# Patient Record
Sex: Male | Born: 1950 | Race: White | Hispanic: No | Marital: Married | Smoking: Former smoker
Health system: Southern US, Community
[De-identification: ages and names within clinical notes are randomized; demographics above are authoritative.]

## PROBLEM LIST (undated history)

## (undated) DIAGNOSIS — I739 Peripheral vascular disease, unspecified: Secondary | ICD-10-CM

## (undated) DIAGNOSIS — N529 Male erectile dysfunction, unspecified: Secondary | ICD-10-CM

## (undated) DIAGNOSIS — R972 Elevated prostate specific antigen [PSA]: Secondary | ICD-10-CM

## (undated) DIAGNOSIS — E785 Hyperlipidemia, unspecified: Secondary | ICD-10-CM

## (undated) HISTORY — DX: Peripheral vascular disease, unspecified: I73.9

## (undated) HISTORY — DX: Hyperlipidemia, unspecified: E78.5

## (undated) HISTORY — PX: TONSILLECTOMY AND ADENOIDECTOMY: SHX28

## (undated) HISTORY — DX: Male erectile dysfunction, unspecified: N52.9

## (undated) HISTORY — DX: Elevated prostate specific antigen (PSA): R97.20

## (undated) HISTORY — PX: COLONOSCOPY: SHX174

## (undated) HISTORY — PX: TONSILLECTOMY: SUR1361

---

## 2005-01-04 ENCOUNTER — Ambulatory Visit (HOSPITAL_COMMUNITY): Admission: RE | Admit: 2005-01-04 | Discharge: 2005-01-04 | Payer: Self-pay | Admitting: Gastroenterology

## 2012-12-24 ENCOUNTER — Other Ambulatory Visit: Payer: Self-pay | Admitting: Dermatology

## 2013-06-15 ENCOUNTER — Other Ambulatory Visit: Payer: Self-pay | Admitting: Dermatology

## 2014-02-25 ENCOUNTER — Other Ambulatory Visit: Payer: Self-pay | Admitting: Oral and Maxillofacial Surgery

## 2015-01-30 ENCOUNTER — Ambulatory Visit: Payer: Self-pay | Admitting: Podiatry

## 2015-07-27 ENCOUNTER — Other Ambulatory Visit: Payer: Self-pay | Admitting: Internal Medicine

## 2015-07-27 ENCOUNTER — Ambulatory Visit
Admission: RE | Admit: 2015-07-27 | Discharge: 2015-07-27 | Disposition: A | Discharge status: Home / Self Care | Payer: PPO | Source: Ambulatory Visit | Attending: Internal Medicine | Admitting: Internal Medicine

## 2015-07-27 DIAGNOSIS — R52 Pain, unspecified: Secondary | ICD-10-CM

## 2015-07-27 DIAGNOSIS — M7989 Other specified soft tissue disorders: Secondary | ICD-10-CM | POA: Diagnosis not present

## 2015-07-27 DIAGNOSIS — M67472 Ganglion, left ankle and foot: Secondary | ICD-10-CM | POA: Diagnosis not present

## 2015-08-04 ENCOUNTER — Other Ambulatory Visit: Payer: Self-pay | Admitting: Internal Medicine

## 2015-08-04 DIAGNOSIS — IMO0002 Reserved for concepts with insufficient information to code with codable children: Secondary | ICD-10-CM

## 2015-08-04 DIAGNOSIS — R229 Localized swelling, mass and lump, unspecified: Principal | ICD-10-CM

## 2015-08-14 ENCOUNTER — Ambulatory Visit
Admission: RE | Admit: 2015-08-14 | Discharge: 2015-08-14 | Disposition: A | Discharge status: Home / Self Care | Payer: PPO | Source: Ambulatory Visit | Attending: Internal Medicine | Admitting: Internal Medicine

## 2015-08-14 DIAGNOSIS — IMO0002 Reserved for concepts with insufficient information to code with codable children: Secondary | ICD-10-CM

## 2015-08-14 DIAGNOSIS — R229 Localized swelling, mass and lump, unspecified: Principal | ICD-10-CM

## 2015-08-14 DIAGNOSIS — M7989 Other specified soft tissue disorders: Secondary | ICD-10-CM | POA: Diagnosis not present

## 2015-09-28 ENCOUNTER — Ambulatory Visit (INDEPENDENT_AMBULATORY_CARE_PROVIDER_SITE_OTHER): Payer: PPO | Admitting: Sports Medicine

## 2015-09-28 ENCOUNTER — Encounter: Payer: Self-pay | Admitting: Sports Medicine

## 2015-09-28 VITALS — BP 144/97 | Ht 72.0 in | Wt 181.0 lb

## 2015-09-28 DIAGNOSIS — M7751 Other enthesopathy of right foot: Secondary | ICD-10-CM | POA: Insufficient documentation

## 2015-09-28 DIAGNOSIS — M21621 Bunionette of right foot: Secondary | ICD-10-CM | POA: Diagnosis not present

## 2015-09-28 DIAGNOSIS — M71571 Other bursitis, not elsewhere classified, right ankle and foot: Secondary | ICD-10-CM

## 2015-09-28 NOTE — Assessment & Plan Note (Signed)
Green inserts and 5th ray post with MT pad on R to help prop up transverse arch and offload lateral 5th MT region F/U PRN, can consider custom orthotic with above corrections as well.

## 2015-09-28 NOTE — Progress Notes (Signed)
  [name] - 65 y.o. male MRN [id number]  Date of birth: 1951-05-19 [name] is a 65 y.o. male who presents today for R foot pain.   R lateral forefoot pain, initial visit 09/28/15 - ongoing now for several months, since August 2016, when he was at a showroom during work and is on his feet for a prolonged period of time. Pain is located along the lateral aspect of the forefoot distally at the fifth MTP lateral joint. Had tried switching shoes and had imaging done as well as aspiration done by his PCP. Did have an MRI performed on 08/14/15 which showed metatarsal bursitis of the fifth lateral region. Denies any adjustments otherwise.  PMHx - Updated and reviewed.  Contributory factors include: Negative PSHx - Updated and reviewed.  Contributory factors include:  Negative FHx - Updated and reviewed.  Contributory factors include:  Negative  Social Hx - Updated and reviewed. Contributory factors include: Non smoker  Medications - None    ROS Per HPI.  12 point negative other than per HPI.   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  Collapse of transverse arch and medial longitudinal arch.   Imaging:  MRI R foot 08/14/15- Bursitis of the fifth lateral metatarsal. Bunionette as well but no bone marrow edema.

## 2016-01-15 DIAGNOSIS — L821 Other seborrheic keratosis: Secondary | ICD-10-CM | POA: Diagnosis not present

## 2016-01-15 DIAGNOSIS — Z85828 Personal history of other malignant neoplasm of skin: Secondary | ICD-10-CM | POA: Diagnosis not present

## 2016-01-15 DIAGNOSIS — L578 Other skin changes due to chronic exposure to nonionizing radiation: Secondary | ICD-10-CM | POA: Diagnosis not present

## 2016-01-15 DIAGNOSIS — L570 Actinic keratosis: Secondary | ICD-10-CM | POA: Diagnosis not present

## 2016-01-30 DIAGNOSIS — Z125 Encounter for screening for malignant neoplasm of prostate: Secondary | ICD-10-CM | POA: Diagnosis not present

## 2016-01-30 DIAGNOSIS — E785 Hyperlipidemia, unspecified: Secondary | ICD-10-CM | POA: Diagnosis not present

## 2016-01-30 DIAGNOSIS — E7800 Pure hypercholesterolemia, unspecified: Secondary | ICD-10-CM | POA: Diagnosis not present

## 2016-01-30 DIAGNOSIS — Z0000 Encounter for general adult medical examination without abnormal findings: Secondary | ICD-10-CM | POA: Diagnosis not present

## 2016-01-30 DIAGNOSIS — I10 Essential (primary) hypertension: Secondary | ICD-10-CM | POA: Diagnosis not present

## 2016-01-30 DIAGNOSIS — D559 Anemia due to enzyme disorder, unspecified: Secondary | ICD-10-CM | POA: Diagnosis not present

## 2016-01-30 DIAGNOSIS — I491 Atrial premature depolarization: Secondary | ICD-10-CM | POA: Diagnosis not present

## 2016-02-12 DIAGNOSIS — R972 Elevated prostate specific antigen [PSA]: Secondary | ICD-10-CM | POA: Diagnosis not present

## 2016-04-05 DIAGNOSIS — R972 Elevated prostate specific antigen [PSA]: Secondary | ICD-10-CM | POA: Diagnosis not present

## 2016-04-05 DIAGNOSIS — R3915 Urgency of urination: Secondary | ICD-10-CM | POA: Diagnosis not present

## 2016-04-05 DIAGNOSIS — R350 Frequency of micturition: Secondary | ICD-10-CM | POA: Diagnosis not present

## 2016-04-05 DIAGNOSIS — N401 Enlarged prostate with lower urinary tract symptoms: Secondary | ICD-10-CM | POA: Diagnosis not present

## 2016-05-10 DIAGNOSIS — R972 Elevated prostate specific antigen [PSA]: Secondary | ICD-10-CM | POA: Diagnosis not present

## 2016-11-29 DIAGNOSIS — D225 Melanocytic nevi of trunk: Secondary | ICD-10-CM | POA: Diagnosis not present

## 2016-11-29 DIAGNOSIS — L570 Actinic keratosis: Secondary | ICD-10-CM | POA: Diagnosis not present

## 2016-11-29 DIAGNOSIS — Z85828 Personal history of other malignant neoplasm of skin: Secondary | ICD-10-CM | POA: Diagnosis not present

## 2016-11-29 DIAGNOSIS — D1801 Hemangioma of skin and subcutaneous tissue: Secondary | ICD-10-CM | POA: Diagnosis not present

## 2016-11-29 DIAGNOSIS — C44519 Basal cell carcinoma of skin of other part of trunk: Secondary | ICD-10-CM | POA: Diagnosis not present

## 2016-11-29 DIAGNOSIS — L821 Other seborrheic keratosis: Secondary | ICD-10-CM | POA: Diagnosis not present

## 2016-11-29 DIAGNOSIS — L812 Freckles: Secondary | ICD-10-CM | POA: Diagnosis not present

## 2016-11-29 DIAGNOSIS — D485 Neoplasm of uncertain behavior of skin: Secondary | ICD-10-CM | POA: Diagnosis not present

## 2016-12-10 DIAGNOSIS — R3915 Urgency of urination: Secondary | ICD-10-CM | POA: Diagnosis not present

## 2016-12-10 DIAGNOSIS — R972 Elevated prostate specific antigen [PSA]: Secondary | ICD-10-CM | POA: Diagnosis not present

## 2016-12-10 DIAGNOSIS — N401 Enlarged prostate with lower urinary tract symptoms: Secondary | ICD-10-CM | POA: Diagnosis not present

## 2016-12-10 DIAGNOSIS — N5201 Erectile dysfunction due to arterial insufficiency: Secondary | ICD-10-CM | POA: Diagnosis not present

## 2017-01-27 DIAGNOSIS — H35361 Drusen (degenerative) of macula, right eye: Secondary | ICD-10-CM | POA: Diagnosis not present

## 2017-01-27 DIAGNOSIS — H5203 Hypermetropia, bilateral: Secondary | ICD-10-CM | POA: Diagnosis not present

## 2017-01-29 DIAGNOSIS — Z0000 Encounter for general adult medical examination without abnormal findings: Secondary | ICD-10-CM | POA: Diagnosis not present

## 2017-01-29 DIAGNOSIS — E7800 Pure hypercholesterolemia, unspecified: Secondary | ICD-10-CM | POA: Diagnosis not present

## 2017-01-29 DIAGNOSIS — C4490 Unspecified malignant neoplasm of skin, unspecified: Secondary | ICD-10-CM | POA: Diagnosis not present

## 2017-01-29 DIAGNOSIS — Z6841 Body Mass Index (BMI) 40.0 and over, adult: Secondary | ICD-10-CM | POA: Diagnosis not present

## 2017-01-29 DIAGNOSIS — I491 Atrial premature depolarization: Secondary | ICD-10-CM | POA: Diagnosis not present

## 2017-03-31 DIAGNOSIS — R799 Abnormal finding of blood chemistry, unspecified: Secondary | ICD-10-CM | POA: Diagnosis not present

## 2017-03-31 DIAGNOSIS — Z23 Encounter for immunization: Secondary | ICD-10-CM | POA: Diagnosis not present

## 2017-06-06 DIAGNOSIS — Z85828 Personal history of other malignant neoplasm of skin: Secondary | ICD-10-CM | POA: Diagnosis not present

## 2017-06-06 DIAGNOSIS — C44619 Basal cell carcinoma of skin of left upper limb, including shoulder: Secondary | ICD-10-CM | POA: Diagnosis not present

## 2017-06-06 DIAGNOSIS — L821 Other seborrheic keratosis: Secondary | ICD-10-CM | POA: Diagnosis not present

## 2017-06-06 DIAGNOSIS — D485 Neoplasm of uncertain behavior of skin: Secondary | ICD-10-CM | POA: Diagnosis not present

## 2017-06-06 DIAGNOSIS — D1801 Hemangioma of skin and subcutaneous tissue: Secondary | ICD-10-CM | POA: Diagnosis not present

## 2017-06-06 DIAGNOSIS — L570 Actinic keratosis: Secondary | ICD-10-CM | POA: Diagnosis not present

## 2017-11-25 DIAGNOSIS — S30860A Insect bite (nonvenomous) of lower back and pelvis, initial encounter: Secondary | ICD-10-CM | POA: Diagnosis not present

## 2017-12-02 DIAGNOSIS — L570 Actinic keratosis: Secondary | ICD-10-CM | POA: Diagnosis not present

## 2017-12-02 DIAGNOSIS — C44519 Basal cell carcinoma of skin of other part of trunk: Secondary | ICD-10-CM | POA: Diagnosis not present

## 2017-12-02 DIAGNOSIS — Z85828 Personal history of other malignant neoplasm of skin: Secondary | ICD-10-CM | POA: Diagnosis not present

## 2017-12-02 DIAGNOSIS — D485 Neoplasm of uncertain behavior of skin: Secondary | ICD-10-CM | POA: Diagnosis not present

## 2017-12-02 DIAGNOSIS — L821 Other seborrheic keratosis: Secondary | ICD-10-CM | POA: Diagnosis not present

## 2017-12-29 DIAGNOSIS — R972 Elevated prostate specific antigen [PSA]: Secondary | ICD-10-CM | POA: Diagnosis not present

## 2018-01-02 DIAGNOSIS — R350 Frequency of micturition: Secondary | ICD-10-CM | POA: Diagnosis not present

## 2018-01-02 DIAGNOSIS — N401 Enlarged prostate with lower urinary tract symptoms: Secondary | ICD-10-CM | POA: Diagnosis not present

## 2018-01-02 DIAGNOSIS — R972 Elevated prostate specific antigen [PSA]: Secondary | ICD-10-CM | POA: Diagnosis not present

## 2018-01-23 DIAGNOSIS — E7800 Pure hypercholesterolemia, unspecified: Secondary | ICD-10-CM | POA: Diagnosis not present

## 2018-01-30 DIAGNOSIS — K635 Polyp of colon: Secondary | ICD-10-CM | POA: Diagnosis not present

## 2018-01-30 DIAGNOSIS — E785 Hyperlipidemia, unspecified: Secondary | ICD-10-CM | POA: Diagnosis not present

## 2018-01-30 DIAGNOSIS — R972 Elevated prostate specific antigen [PSA]: Secondary | ICD-10-CM | POA: Diagnosis not present

## 2018-01-30 DIAGNOSIS — Z6841 Body Mass Index (BMI) 40.0 and over, adult: Secondary | ICD-10-CM | POA: Diagnosis not present

## 2018-01-30 DIAGNOSIS — N401 Enlarged prostate with lower urinary tract symptoms: Secondary | ICD-10-CM | POA: Diagnosis not present

## 2018-02-17 DIAGNOSIS — R0781 Pleurodynia: Secondary | ICD-10-CM | POA: Diagnosis not present

## 2018-02-18 ENCOUNTER — Ambulatory Visit
Admission: RE | Admit: 2018-02-18 | Discharge: 2018-02-18 | Disposition: A | Discharge status: Home / Self Care | Payer: PPO | Source: Ambulatory Visit | Attending: Internal Medicine | Admitting: Internal Medicine

## 2018-02-18 ENCOUNTER — Other Ambulatory Visit: Payer: Self-pay | Admitting: Internal Medicine

## 2018-02-18 DIAGNOSIS — R0781 Pleurodynia: Secondary | ICD-10-CM

## 2018-02-18 DIAGNOSIS — S2241XA Multiple fractures of ribs, right side, initial encounter for closed fracture: Secondary | ICD-10-CM | POA: Diagnosis not present

## 2018-06-08 DIAGNOSIS — L812 Freckles: Secondary | ICD-10-CM | POA: Diagnosis not present

## 2018-06-08 DIAGNOSIS — Z85828 Personal history of other malignant neoplasm of skin: Secondary | ICD-10-CM | POA: Diagnosis not present

## 2018-06-08 DIAGNOSIS — L821 Other seborrheic keratosis: Secondary | ICD-10-CM | POA: Diagnosis not present

## 2018-06-08 DIAGNOSIS — L570 Actinic keratosis: Secondary | ICD-10-CM | POA: Diagnosis not present

## 2018-07-06 DIAGNOSIS — R972 Elevated prostate specific antigen [PSA]: Secondary | ICD-10-CM | POA: Diagnosis not present

## 2018-12-09 DIAGNOSIS — D2261 Melanocytic nevi of right upper limb, including shoulder: Secondary | ICD-10-CM | POA: Diagnosis not present

## 2018-12-09 DIAGNOSIS — Z85828 Personal history of other malignant neoplasm of skin: Secondary | ICD-10-CM | POA: Diagnosis not present

## 2018-12-09 DIAGNOSIS — D225 Melanocytic nevi of trunk: Secondary | ICD-10-CM | POA: Diagnosis not present

## 2018-12-09 DIAGNOSIS — C44519 Basal cell carcinoma of skin of other part of trunk: Secondary | ICD-10-CM | POA: Diagnosis not present

## 2018-12-09 DIAGNOSIS — L814 Other melanin hyperpigmentation: Secondary | ICD-10-CM | POA: Diagnosis not present

## 2018-12-09 DIAGNOSIS — D485 Neoplasm of uncertain behavior of skin: Secondary | ICD-10-CM | POA: Diagnosis not present

## 2018-12-09 DIAGNOSIS — L821 Other seborrheic keratosis: Secondary | ICD-10-CM | POA: Diagnosis not present

## 2018-12-09 DIAGNOSIS — L570 Actinic keratosis: Secondary | ICD-10-CM | POA: Diagnosis not present

## 2018-12-28 DIAGNOSIS — R972 Elevated prostate specific antigen [PSA]: Secondary | ICD-10-CM | POA: Diagnosis not present

## 2019-01-04 DIAGNOSIS — R972 Elevated prostate specific antigen [PSA]: Secondary | ICD-10-CM | POA: Diagnosis not present

## 2019-01-04 DIAGNOSIS — N400 Enlarged prostate without lower urinary tract symptoms: Secondary | ICD-10-CM | POA: Diagnosis not present

## 2019-02-01 DIAGNOSIS — C4490 Unspecified malignant neoplasm of skin, unspecified: Secondary | ICD-10-CM | POA: Diagnosis not present

## 2019-02-01 DIAGNOSIS — K635 Polyp of colon: Secondary | ICD-10-CM | POA: Diagnosis not present

## 2019-02-01 DIAGNOSIS — E7800 Pure hypercholesterolemia, unspecified: Secondary | ICD-10-CM | POA: Diagnosis not present

## 2019-02-01 DIAGNOSIS — R972 Elevated prostate specific antigen [PSA]: Secondary | ICD-10-CM | POA: Diagnosis not present

## 2019-04-30 ENCOUNTER — Other Ambulatory Visit: Payer: Self-pay

## 2019-04-30 DIAGNOSIS — Z20822 Contact with and (suspected) exposure to covid-19: Secondary | ICD-10-CM

## 2019-05-01 LAB — NOVEL CORONAVIRUS, NAA: SARS-CoV-2, NAA: NOT DETECTED

## 2019-05-03 ENCOUNTER — Other Ambulatory Visit: Payer: Self-pay | Admitting: Registered"

## 2019-05-03 DIAGNOSIS — Z20822 Contact with and (suspected) exposure to covid-19: Secondary | ICD-10-CM

## 2019-05-04 LAB — NOVEL CORONAVIRUS, NAA: SARS-CoV-2, NAA: NOT DETECTED

## 2019-06-08 ENCOUNTER — Other Ambulatory Visit (HOSPITAL_COMMUNITY): Payer: Self-pay | Admitting: Internal Medicine

## 2019-06-08 DIAGNOSIS — I999 Unspecified disorder of circulatory system: Secondary | ICD-10-CM | POA: Diagnosis not present

## 2019-06-08 DIAGNOSIS — I739 Peripheral vascular disease, unspecified: Secondary | ICD-10-CM

## 2019-06-10 ENCOUNTER — Other Ambulatory Visit: Payer: Self-pay

## 2019-06-10 ENCOUNTER — Ambulatory Visit (HOSPITAL_BASED_OUTPATIENT_CLINIC_OR_DEPARTMENT_OTHER)
Admission: RE | Admit: 2019-06-10 | Discharge: 2019-06-10 | Disposition: A | Discharge status: Home / Self Care | Payer: PPO | Source: Ambulatory Visit | Attending: Internal Medicine | Admitting: Internal Medicine

## 2019-06-10 ENCOUNTER — Ambulatory Visit (HOSPITAL_COMMUNITY)
Admission: RE | Admit: 2019-06-10 | Discharge: 2019-06-10 | Disposition: A | Discharge status: Home / Self Care | Payer: PPO | Source: Ambulatory Visit | Attending: Internal Medicine | Admitting: Internal Medicine

## 2019-06-10 DIAGNOSIS — I739 Peripheral vascular disease, unspecified: Secondary | ICD-10-CM | POA: Diagnosis not present

## 2019-06-10 DIAGNOSIS — I70211 Atherosclerosis of native arteries of extremities with intermittent claudication, right leg: Secondary | ICD-10-CM | POA: Diagnosis not present

## 2019-06-10 NOTE — Progress Notes (Addendum)
RLE arterial duplex and ABI       have been completed. Preliminary results can be found under CV proc through chart review.    Abnormal ABI on the right. Right duplex reveals occluded profunda femoral artery, popliteal artery, peroneal artery, and posterior tibial artery.    Called results to Dr. Nyoka Cowden. Patient instructed to go to the office now.   [name], BS, RDMS, RVT

## 2019-06-15 DIAGNOSIS — C44519 Basal cell carcinoma of skin of other part of trunk: Secondary | ICD-10-CM | POA: Diagnosis not present

## 2019-06-15 DIAGNOSIS — Z85828 Personal history of other malignant neoplasm of skin: Secondary | ICD-10-CM | POA: Diagnosis not present

## 2019-06-15 DIAGNOSIS — L821 Other seborrheic keratosis: Secondary | ICD-10-CM | POA: Diagnosis not present

## 2019-06-15 DIAGNOSIS — D485 Neoplasm of uncertain behavior of skin: Secondary | ICD-10-CM | POA: Diagnosis not present

## 2019-06-15 DIAGNOSIS — L812 Freckles: Secondary | ICD-10-CM | POA: Diagnosis not present

## 2019-06-16 ENCOUNTER — Encounter: Payer: Self-pay | Admitting: Cardiovascular Disease

## 2019-06-16 ENCOUNTER — Other Ambulatory Visit: Payer: Self-pay

## 2019-06-16 ENCOUNTER — Ambulatory Visit: Payer: PPO | Admitting: Cardiovascular Disease

## 2019-06-16 DIAGNOSIS — R072 Precordial pain: Secondary | ICD-10-CM | POA: Diagnosis not present

## 2019-06-16 DIAGNOSIS — E785 Hyperlipidemia, unspecified: Secondary | ICD-10-CM | POA: Insufficient documentation

## 2019-06-16 DIAGNOSIS — I739 Peripheral vascular disease, unspecified: Secondary | ICD-10-CM | POA: Insufficient documentation

## 2019-06-16 MED ORDER — CILOSTAZOL 50 MG PO TABS: 50.0000 mg | ORAL_TABLET | Freq: Two times a day (BID) | ORAL | 3 refills | Status: DC

## 2019-06-16 NOTE — Progress Notes (Signed)
06/16/2019 [name]   January 15, 1951  [id number]  Primary Physician Levin Erp, MD Primary Cardiologist: [name] MD [name], [place]  HPI:  [name] is a 68 y.o. 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  He was referred by Dr. Nyoka Cowden, his PCP, for evaluation and treatment of claudication.  His only risk factors are of treated hyperlipidemia.  He does not smoke.  There is no family history of heart disease.  Is never had a heart attack or stroke.  He denies chest pain or shortness of breath.  He is very active and exercises on a daily basis at a gym with a trainer.  He also uses free weights.  He developed right calf claudication approxi-1 month ago which has been lifestyle limiting.  Recent Dopplers performed 06/10/2019 revealed a right ABI of 1.58 with an occluded right popliteal artery.   Current Meds  Medication Sig  . atorvastatin (LIPITOR) 10 MG tablet Take 10 mg by mouth daily.     Allergies  Allergen Reactions  . Penicillins     Social History   Socioeconomic History  . Marital status: Married    Spouse name: [name]  . Number of children: [name]  . Years of education: [name]  . Highest education level: [name]  Occupational History  . [name]  Social Needs  . Financial resource strain: [name]  . Food insecurity    Worry: [name]    Inability: [name]  . Transportation needs    Medical: [name]    Non-medical: [name]  Tobacco Use  . Smoking status: Never Smoker  . Smokeless tobacco: Never Used  Substance and Sexual Activity  . Alcohol use: [name]  . Drug use: [name]  . Sexual activity: [name]  Lifestyle  . Physical activity    Days per week: [name]    Minutes per session: [name]  . Stress: [name]  Relationships  . Social Herbalist on phone: [name]   Gets together: [name]    Attends religious service: [name]    Active member of club or organization: [name]    Attends meetings of clubs or organizations: [name]    Relationship status: [name]  . Intimate partner violence    Fear of current or ex partner: [name]    Emotionally abused: [name]    Physically abused: [name]    Forced sexual activity: [name]  Other Topics Concern  . [name]  Social History Narrative  . [name]     Review of Systems: General: negative for chills, fever, night sweats or weight changes.  Cardiovascular: negative for chest pain, dyspnea on exertion, edema, orthopnea, palpitations, paroxysmal nocturnal dyspnea or shortness of breath Dermatological: negative for rash Respiratory: negative for cough or wheezing Urologic: negative for hematuria Abdominal: negative for nausea, vomiting, diarrhea, bright red blood per rectum, melena, or hematemesis Neurologic: negative for visual changes, syncope, or dizziness All other systems reviewed and are otherwise negative except as noted above.    Blood pressure 130/76, pulse 87, temperature (!) 95.7 F (35.4 C), height 6' (1.829 m),  weight 171 lb (77.6 kg).  General appearance: alert and no distress Neck: no adenopathy, no carotid bruit, no JVD, supple, symmetrical, trachea midline and thyroid not enlarged, symmetric, no tenderness/mass/nodules Lungs: clear to auscultation bilaterally Heart: regular rate and rhythm, S1, S2 normal, no murmur, click, rub or gallop Extremities: extremities normal, atraumatic, no cyanosis or edema Pulses: Absent right pedal pulse Skin: Skin color, texture, turgor normal. No rashes or lesions Neurologic: Alert and oriented X 3, normal strength and tone. Normal symmetric reflexes. Normal coordination and gait  EKG sinus rhythm 87 with low limb voltage.  I personally reviewed this EKG.  ASSESSMENT AND PLAN:   Hyperlipidemia History  of hyperlipidemia on low-dose statin therapy followed by his PCP  Claudication in peripheral vascular disease ([place]) New onset right calf claudication approximonth ago.  He is very active and exercises daily at [employer] as well as with a trainer.  He had Doppler studies performed in our office 06/10/2019 revealing an occluded popliteal artery as well as tibial vessels.  His his claudication is somewhat improved over the last month suggesting recruited collaterals.  I am going to get a coronary calcium score to assess cardiovascular risk, begin him on cilostazol 50 mg p.o. twice daily and suggest continued exercise.  I will see him back in 3 months.  If he continues to have lifestyle and claudication despite these measures we will discuss an interventional approach.      [name] MD FACP,FACC,FAHA, [facility] 06/16/2019 10:53 AM

## 2019-06-16 NOTE — Assessment & Plan Note (Signed)
History of hyperlipidemia on low-dose statin therapy followed by his PCP. ?

## 2019-06-16 NOTE — Patient Instructions (Signed)
Medication Instructions:  Start taking 50mg  Pletal Twice Daily.   If you need a refill on your cardiac medications before your next appointment, please call your pharmacy.   Lab work: NONE  Testing/Procedures: Coronary Calcium Score  Follow-Up: At [facility], you and your health needs are our priority.  As part of our continuing mission to provide you with exceptional heart care, we have created designated Provider Care Teams.  These Care Teams include your primary Cardiologist (physician) and Advanced Practice Providers (APPs -  Physician Assistants and Nurse Practitioners) who all work together to provide you with the care you need, when you need it. You may see Dr Gwenlyn Found or one of the following Advanced Practice Providers on your designated Care Team:    Kerin Ransom, PA-C  [place], [place]  Coletta Memos, [place]  Your physician wants you to follow-up in: 3 months  Any Other Special Instructions Will Be Listed Below (If Applicable). Coronary Calcium Scan A coronary calcium scan is an imaging test used to look for deposits of calcium and other fatty materials (plaques) in the inner lining of the blood vessels of the heart (coronary arteries). These deposits of calcium and plaques can partly clog and narrow the coronary arteries without producing any symptoms or warning signs. This puts a person at risk for a heart attack. This test can detect these deposits before symptoms develop. Tell a health care provider about:  Any allergies you have.  All medicines you are taking, including vitamins, herbs, eye drops, creams, and over-the-counter medicines.  Any problems you or family members have had with anesthetic medicines.  Any blood disorders you have.  Any surgeries you have had.  Any medical conditions you have.  Whether you are pregnant or may be pregnant. What are the risks? Generally, this is a safe procedure. However, problems may occur, including:  Harm to a  pregnant woman and her unborn baby. This test involves the use of radiation. Radiation exposure can be dangerous to a pregnant woman and her unborn baby. If you are pregnant, you generally should not have this procedure done.  Slight increase in the risk of cancer. This is because of the radiation involved in the test. What happens before the procedure? No preparation is needed for this procedure. What happens during the procedure?   You will undress and remove any jewelry around your neck or chest.  You will put on a hospital gown.  Sticky electrodes will be placed on your chest. The electrodes will be connected to an electrocardiogram (ECG) machine to record a tracing of the electrical activity of your heart.  A CT scanner will take pictures of your heart. During this time, you will be asked to lie still and hold your breath for 2-3 seconds while a picture of your heart is being taken. The procedure may vary among health care providers and hospitals. What happens after the procedure?  You can get dressed.  You can return to your normal activities.  It is up to you to get the results of your test. Ask your health care provider, or the department that is doing the test, when your results will be ready. Summary  A coronary calcium scan is an imaging test used to look for deposits of calcium and other fatty materials (plaques) in the inner lining of the blood vessels of the heart (coronary arteries).  Generally, this is a safe procedure. Tell your health care provider if you are pregnant or may be pregnant.  No preparation is needed for this procedure.  A CT scanner will take pictures of your heart.  You can return to your normal activities after the scan is done. This information is not intended to replace advice given to you by your health care provider. Make sure you discuss any questions you have with your health care provider. Document Released: 12/21/2007 Document Revised:  06/06/2017 Document Reviewed: 05/13/2016 Elsevier Patient Education  2020 [employer].

## 2019-06-16 NOTE — Assessment & Plan Note (Signed)
New onset right calf claudication approximonth ago.  He is very active and exercises daily at [employer] as well as with a trainer.  He had Doppler studies performed in our office 06/10/2019 revealing an occluded popliteal artery as well as tibial vessels.  His his claudication is somewhat improved over the last month suggesting recruited collaterals.  I am going to get a coronary calcium score to assess cardiovascular risk, begin him on cilostazol 50 mg p.o. twice daily and suggest continued exercise.  I will see him back in 3 months.  If he continues to have lifestyle and claudication despite these measures we will discuss an interventional approach.

## 2019-06-18 ENCOUNTER — Ambulatory Visit (INDEPENDENT_AMBULATORY_CARE_PROVIDER_SITE_OTHER): Payer: PPO | Admitting: Vascular Surgery

## 2019-06-18 ENCOUNTER — Encounter: Payer: Self-pay | Admitting: Vascular Surgery

## 2019-06-18 ENCOUNTER — Other Ambulatory Visit: Payer: Self-pay

## 2019-06-18 VITALS — BP 143/85 | HR 89 | Temp 97.9°F | Resp 20 | Ht 72.0 in | Wt 172.0 lb

## 2019-06-18 DIAGNOSIS — I739 Peripheral vascular disease, unspecified: Secondary | ICD-10-CM

## 2019-06-18 NOTE — Progress Notes (Signed)
Patient ID: [name], male   DOB: 1951/04/30, 68 y.o.   MRN: [id number]  Reason for Consult: New Patient (Initial Visit)   Referred by Levin Erp, MD  Subjective:     HPI:  [name] is a 68 y.o. male without significant vascular history.  Patient states that 5 weeks ago he developed right lower extremity calf cramping.  He is very active continues to workout although has not been doing much given COVID-19.  He also had discoloration of his toe this does not cause him any pain.  He is now back to his level of activity does not have any limitations of his walking.  He is taking 10 mg of Lipitor has begun taking baby aspirin and also taking Pletal.  Prior to 5 weeks ago patient had no issues  Past Medical History:  Diagnosis Date  . Hyperlipidemia    History reviewed. No pertinent family history. Past Surgical History:  Procedure Laterality Date  . TONSILLECTOMY AND ADENOIDECTOMY      Short Social History:  Social History   Tobacco Use  . Smoking status: Never Smoker  . Smokeless tobacco: Never Used  Substance Use Topics  . Alcohol use: Yes    Alcohol/week: 0.0 standard drinks    Allergies  Allergen Reactions  . Penicillins     Current Outpatient Medications  Medication Sig Dispense Refill  . atorvastatin (LIPITOR) 10 MG tablet Take 10 mg by mouth daily.    . cilostazol (PLETAL) 50 MG tablet Take 1 tablet (50 mg total) by mouth 2 (two) times daily. 180 tablet 3   No current facility-administered medications for this visit.    Review of Systems  Constitutional:  Constitutional negative. HENT: HENT negative.  Eyes: Eyes negative.  Cardiovascular: Positive for claudication.  GI: Gastrointestinal negative.  Musculoskeletal:       Painless right great toe discoloration Skin: Skin negative.  Neurological: Neurological negative. Hematologic: Hematologic/lymphatic negative.  Psychiatric: Psychiatric negative.        Objective:  Objective    Vitals:   06/18/19 1134  BP: (!) 143/85  Pulse: 89  Resp: 20  Temp: 97.9 F (36.6 C)  SpO2: 98%  Weight: 172 lb (78 kg)  Height: 6' (1.829 m)   Body mass index is 23.33 kg/m.  Physical Exam HENT:     Head: Normocephalic.     Right Ear: Tympanic membrane normal.     Nose: Nose normal.     Mouth/Throat:     Mouth: Mucous membranes are moist.  Eyes:     Pupils: Pupils are equal, round, and reactive to light.  Cardiovascular:     Rate and Rhythm: Normal rate.     Pulses:          Carotid pulses are 2+ on the right side and 2+ on the left side.      Radial pulses are 2+ on the right side and 2+ on the left side.       Femoral pulses are 2+ on the right side and 2+ on the left side.      Popliteal pulses are 0 on the right side and 2+ on the left side.       Dorsalis pedis pulses are 0 on the right side and 2+ on the left side.       Posterior tibial pulses are detected w/ Doppler on the right side and 2+ on the left side.  Pulmonary:     Effort: Pulmonary effort is  normal.  Abdominal:     General: Abdomen is flat.     Palpations: Abdomen is soft.  Musculoskeletal:        General: No swelling. Normal range of motion.  Skin:    General: Skin is warm and dry.  Neurological:     General: No focal deficit present.     Mental Status: He is alert.  Psychiatric:        Mood and Affect: Mood normal.        Behavior: Behavior normal.        Thought Content: Thought content normal.        Judgment: Judgment normal.     Data: I reviewed his right lower extremity studies which demonstrate ABI of 0.58 a profunda that appears occluded the triphasic common femoral artery and SFA proximally then occludes his popliteal arteries PT peroneal arteries are occluded there is monophasic signals in the anterior tibial artery     Assessment/Plan:     68 year old male without significant risk factors for vascular disease other than hyperlipidemia developed acute onset of right lower  extremity claudication.  Appears to have popliteal profunda and tibial disease.  Given the patient is mostly asymptomatic at this time I would not recommend intervention.  He does have purple discoloration of his toe although this is painless I recommend him to protect this toe.  I will see him back in short order with repeat ABIs.  Certainly if his symptoms worsen I would consider aortogram from a left common femoral approach.  Patient has been seen by Dr. Gwenlyn Found is okay with me if he follows in their office rather than here.  If patient chooses to be seen here I will see him back in 3 months unless he needs to be seen sooner.     Waynetta Sandy MD [facility]

## 2019-06-30 ENCOUNTER — Ambulatory Visit (INDEPENDENT_AMBULATORY_CARE_PROVIDER_SITE_OTHER)
Admission: RE | Admit: 2019-06-30 | Discharge: 2019-06-30 | Disposition: A | Discharge status: Home / Self Care | Payer: Self-pay | Source: Ambulatory Visit | Attending: Cardiovascular Disease | Admitting: Cardiovascular Disease

## 2019-06-30 ENCOUNTER — Other Ambulatory Visit: Payer: Self-pay

## 2019-06-30 DIAGNOSIS — R072 Precordial pain: Secondary | ICD-10-CM

## 2019-07-05 DIAGNOSIS — N401 Enlarged prostate with lower urinary tract symptoms: Secondary | ICD-10-CM | POA: Diagnosis not present

## 2019-07-07 ENCOUNTER — Other Ambulatory Visit: Payer: Self-pay | Admitting: *Deleted

## 2019-07-07 DIAGNOSIS — I739 Peripheral vascular disease, unspecified: Secondary | ICD-10-CM

## 2019-07-12 DIAGNOSIS — I739 Peripheral vascular disease, unspecified: Secondary | ICD-10-CM | POA: Diagnosis not present

## 2019-07-28 ENCOUNTER — Ambulatory Visit: Payer: PPO | Attending: Internal Medicine

## 2019-07-28 DIAGNOSIS — Z23 Encounter for immunization: Secondary | ICD-10-CM | POA: Insufficient documentation

## 2019-08-18 ENCOUNTER — Ambulatory Visit: Payer: PPO | Attending: Internal Medicine

## 2019-08-18 DIAGNOSIS — Z23 Encounter for immunization: Secondary | ICD-10-CM | POA: Insufficient documentation

## 2019-08-18 NOTE — Progress Notes (Signed)
   Covid-19 Vaccination Clinic  Name:  [name]    MRN: [id number] DOB: [birth year]  08/18/2019  Mr. Lafrance was observed post Covid-19 immunization for 15 minutes without incidence. He was provided with Vaccine Information Sheet and instruction to access the V-Safe system.   Mr. Weishaupt was instructed to call 911 with any severe reactions post vaccine: Marland Kitchen Difficulty breathing  . Swelling of your face and throat  . A fast heartbeat  . A bad rash all over your body  . Dizziness and weakness    Immunizations Administered    Name Date Dose VIS Date Route   Pfizer COVID-19 Vaccine 08/18/2019 10:29 AM 0.3 mL 06/18/2019 Intramuscular   Manufacturer: [place]   Lot: [id number]   [place]: [id number]

## 2019-09-03 DIAGNOSIS — I739 Peripheral vascular disease, unspecified: Secondary | ICD-10-CM | POA: Diagnosis not present

## 2019-09-03 DIAGNOSIS — E7849 Other hyperlipidemia: Secondary | ICD-10-CM | POA: Diagnosis not present

## 2019-09-03 DIAGNOSIS — Z85828 Personal history of other malignant neoplasm of skin: Secondary | ICD-10-CM | POA: Diagnosis not present

## 2019-09-03 DIAGNOSIS — N401 Enlarged prostate with lower urinary tract symptoms: Secondary | ICD-10-CM | POA: Diagnosis not present

## 2019-09-03 DIAGNOSIS — Z1331 Encounter for screening for depression: Secondary | ICD-10-CM | POA: Diagnosis not present

## 2019-09-14 ENCOUNTER — Encounter: Payer: Self-pay | Admitting: Cardiovascular Disease

## 2019-09-14 ENCOUNTER — Other Ambulatory Visit: Payer: Self-pay

## 2019-09-14 ENCOUNTER — Ambulatory Visit: Payer: PPO | Admitting: Cardiovascular Disease

## 2019-09-14 DIAGNOSIS — I739 Peripheral vascular disease, unspecified: Secondary | ICD-10-CM

## 2019-09-14 DIAGNOSIS — E782 Mixed hyperlipidemia: Secondary | ICD-10-CM

## 2019-09-14 NOTE — Assessment & Plan Note (Signed)
History of hyperlipidemia with lipid profile performed 09/03/2019 revealing total cholesterol 208, LDL of 124 and HDL of 68.  His PCP recently increase his atorvastatin from 10 to 40 mg a day and is following this.

## 2019-09-14 NOTE — Assessment & Plan Note (Signed)
History of peripheral arterial disease with right calf claudication new in onset in October/November with Doppler studies revealing a right ABI 0.58 and an occluded right popliteal artery.  Based on this I did begin him on Pletal 50 mg p.o. twice daily.  We talked about and consider a conservative approach unless he was recalcitrant.  He did see Dr. Donzetta Matters who felt similarly.  Since I saw him he has been exercising and his claudication has almost completely resolved.  I suspect he has recruited collaterals.  This point, we do not usually feel that an interventional approach is appropriate.

## 2019-09-14 NOTE — Patient Instructions (Signed)
Medication Instructions:  Your physician recommends that you continue on your current medications as directed. Please refer to the Current Medication list given to you today.  If you need a refill on your cardiac medications before your next appointment, please call your pharmacy.   Lab work: NONE  Testing/Procedures: NONE  Follow-Up: At [facility], you and your health needs are our priority.  As part of our continuing mission to provide you with exceptional heart care, we have created designated Provider Care Teams.  These Care Teams include your primary Cardiologist (physician) and Advanced Practice Providers (APPs -  Physician Assistants and Nurse Practitioners) who all work together to provide you with the care you need, when you need it. You may see Dr. Berry or one of the following Advanced Practice Providers on your designated Care Team:    Luke Kilroy, PA-C  Callie Goodrich, PA-C  Jesse Cleaver, FNP  Your physician wants you to follow-up in: 6 months with Dr. Berry. You will receive a reminder letter in the mail two months in advance. If you don't receive a letter, please call our office to schedule the follow-up appointment.      

## 2019-09-14 NOTE — Progress Notes (Signed)
09/14/2019 [name]   [birth year]  [id number]  Primary Physician [name], DO Primary Cardiologist: [name] MD Lupe Carney, [place]  HPI:  [name] is a 69 y.o.  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  He was referred by Dr. Nyoka Cowden, his PCP, for evaluation and treatment of claudication.  I last saw him in the office 06/15/2020. His only risk factors are of treated hyperlipidemia.  He does not smoke.  There is no family history of heart disease.  Is never had a heart attack or stroke.  He denies chest pain or shortness of breath.  He is very active and exercises on a daily basis at a gym with a trainer.  He also uses free weights.  He developed right calf claudication approxi-1 month ago which has been lifestyle limiting.  Recent Dopplers performed 06/10/2019 revealed a right ABI of 0.58 with an occluded right popliteal artery.  Since I saw him 3 months ago I did begin him on Pletal 50 mg p.o. twice daily.  He continues to walk and his claudication is all but resolved.  His PCP did begin him on a higher dose of atorvastatin (10 mg initially up to 40 mg).  I also performed a coronary calcium score which was 43 with mild scattered coronary calcifications.  He denies chest pain or shortness of breath.   Current Meds  Medication Sig  . atorvastatin (LIPITOR) 10 MG tablet Take 10 mg by mouth daily.  . cilostazol (PLETAL) 50 MG tablet Take 1 tablet (50 mg total) by mouth 2 (two) times daily.     Allergies  Allergen Reactions  . Penicillins     Social History   Socioeconomic History  . Marital status: Married    Spouse name: [name]  . Number of children: [name]  . Years of education: [name]  . Highest education level: [name]  Occupational History  . [name]  Tobacco Use  . Smoking status: Never Smoker  . Smokeless tobacco: Never Used    Substance and Sexual Activity  . Alcohol use: Yes    Alcohol/week: 0.0 standard drinks  . Drug use: Never  . Sexual activity: [name]  Other Topics Concern  . [name]  Social History Narrative  . [name]   Social Determinants of Health   Financial Resource Strain:   . Difficulty of Paying Living Expenses: [name]  Food Insecurity:   . Worried About Charity fundraiser in the Last Year: [name]  . Ran Out of Food in the Last Year: [name]  Transportation Needs:   . Lack of Transportation (Medical): [name]  . Lack of Transportation (Non-Medical): [name]  Physical Activity:   . Days of Exercise per Week: [name]  . Minutes of Exercise per Session: [name]  Stress:   . Feeling of Stress : [name]  Social Connections:   . Frequency of Communication with Friends and Family: [name]  . Frequency of Social Gatherings with Friends and Family: [name]  . Attends Religious Services: [name]  . Active Member of Clubs or Organizations: [name]  . Attends Archivist Meetings: [name]  . Marital Status: Not on  file  Intimate Partner Violence:   . Fear of Current or Ex-Partner: [name]  . Emotionally Abused: [name]  . Physically Abused: [name]  . Sexually Abused: [name]     Review of Systems: General: negative for chills, fever, night sweats or weight changes.  Cardiovascular: negative for chest pain, dyspnea on exertion, edema, orthopnea, palpitations, paroxysmal nocturnal dyspnea or shortness of breath Dermatological: negative for rash Respiratory: negative for cough or wheezing Urologic: negative for hematuria Abdominal: negative for nausea, vomiting, diarrhea, bright red blood per rectum, melena, or hematemesis Neurologic: negative for visual changes, syncope, or dizziness All other systems reviewed and are otherwise negative except as noted above.    Blood pressure (!) 142/78,  pulse 84, temperature (!) 97.2 F (36.2 C), resp. rate (!) 21, height 6' (1.829 m), weight 174 lb 6.4 oz (79.1 kg), SpO2 99 %.  General appearance: alert and no distress Neck: no adenopathy, no carotid bruit, no JVD, supple, symmetrical, trachea midline and thyroid not enlarged, symmetric, no tenderness/mass/nodules Lungs: clear to auscultation bilaterally Heart: regular rate and rhythm, S1, S2 normal, no murmur, click, rub or gallop Extremities: extremities normal, atraumatic, no cyanosis or edema Pulses: 2+ and symmetric Skin: Skin color, texture, turgor normal. No rashes or lesions Neurologic: Alert and oriented X 3, normal strength and tone. Normal symmetric reflexes. Normal coordination and gait  EKG not performed today  ASSESSMENT AND PLAN:   Hyperlipidemia History of hyperlipidemia with lipid profile performed 09/03/2019 revealing total cholesterol 208, LDL of 124 and HDL of 68.  His PCP recently increase his atorvastatin from 10 to 40 mg a day and is following this.  Claudication in peripheral vascular disease ([place]) History of peripheral arterial disease with right calf claudication new in onset in October/November with Doppler studies revealing a right ABI 0.58 and an occluded right popliteal artery.  Based on this I did begin him on Pletal 50 mg p.o. twice daily.  We talked about and consider a conservative approach unless he was recalcitrant.  He did see Dr. Donzetta Matters who felt similarly.  Since I saw him he has been exercising and his claudication has almost completely resolved.  I suspect he has recruited collaterals.  This point, we do not usually feel that an interventional approach is appropriate.      [name] MD FACP,FACC,FAHA, [facility] 09/14/2019 9:23 AM

## 2019-09-24 ENCOUNTER — Ambulatory Visit: Payer: PPO | Admitting: Vascular Surgery

## 2019-09-24 ENCOUNTER — Encounter (HOSPITAL_COMMUNITY): Payer: PPO

## 2019-12-08 DIAGNOSIS — Z85828 Personal history of other malignant neoplasm of skin: Secondary | ICD-10-CM | POA: Diagnosis not present

## 2019-12-08 DIAGNOSIS — C44719 Basal cell carcinoma of skin of left lower limb, including hip: Secondary | ICD-10-CM | POA: Diagnosis not present

## 2019-12-08 DIAGNOSIS — L570 Actinic keratosis: Secondary | ICD-10-CM | POA: Diagnosis not present

## 2019-12-08 DIAGNOSIS — L720 Epidermal cyst: Secondary | ICD-10-CM | POA: Diagnosis not present

## 2019-12-08 DIAGNOSIS — L821 Other seborrheic keratosis: Secondary | ICD-10-CM | POA: Diagnosis not present

## 2019-12-08 DIAGNOSIS — C44519 Basal cell carcinoma of skin of other part of trunk: Secondary | ICD-10-CM | POA: Diagnosis not present

## 2019-12-08 DIAGNOSIS — D485 Neoplasm of uncertain behavior of skin: Secondary | ICD-10-CM | POA: Diagnosis not present

## 2019-12-31 DIAGNOSIS — N401 Enlarged prostate with lower urinary tract symptoms: Secondary | ICD-10-CM | POA: Diagnosis not present

## 2020-01-07 DIAGNOSIS — N528 Other male erectile dysfunction: Secondary | ICD-10-CM | POA: Diagnosis not present

## 2020-01-07 DIAGNOSIS — R972 Elevated prostate specific antigen [PSA]: Secondary | ICD-10-CM | POA: Diagnosis not present

## 2020-01-07 DIAGNOSIS — N401 Enlarged prostate with lower urinary tract symptoms: Secondary | ICD-10-CM | POA: Diagnosis not present

## 2020-01-07 DIAGNOSIS — N400 Enlarged prostate without lower urinary tract symptoms: Secondary | ICD-10-CM | POA: Diagnosis not present

## 2020-01-20 DIAGNOSIS — Z85828 Personal history of other malignant neoplasm of skin: Secondary | ICD-10-CM | POA: Diagnosis not present

## 2020-01-20 DIAGNOSIS — C44519 Basal cell carcinoma of skin of other part of trunk: Secondary | ICD-10-CM | POA: Diagnosis not present

## 2020-01-20 DIAGNOSIS — L821 Other seborrheic keratosis: Secondary | ICD-10-CM | POA: Diagnosis not present

## 2020-02-01 DIAGNOSIS — Z125 Encounter for screening for malignant neoplasm of prostate: Secondary | ICD-10-CM | POA: Diagnosis not present

## 2020-02-01 DIAGNOSIS — E7849 Other hyperlipidemia: Secondary | ICD-10-CM | POA: Diagnosis not present

## 2020-02-03 ENCOUNTER — Other Ambulatory Visit: Payer: Self-pay | Admitting: Internal Medicine

## 2020-02-03 DIAGNOSIS — Z0000 Encounter for general adult medical examination without abnormal findings: Secondary | ICD-10-CM | POA: Diagnosis not present

## 2020-02-03 DIAGNOSIS — Z1331 Encounter for screening for depression: Secondary | ICD-10-CM | POA: Diagnosis not present

## 2020-02-03 DIAGNOSIS — K4090 Unilateral inguinal hernia, without obstruction or gangrene, not specified as recurrent: Secondary | ICD-10-CM | POA: Diagnosis not present

## 2020-02-03 DIAGNOSIS — N401 Enlarged prostate with lower urinary tract symptoms: Secondary | ICD-10-CM | POA: Diagnosis not present

## 2020-02-03 DIAGNOSIS — Z136 Encounter for screening for cardiovascular disorders: Secondary | ICD-10-CM

## 2020-02-03 DIAGNOSIS — Z87891 Personal history of nicotine dependence: Secondary | ICD-10-CM

## 2020-02-03 DIAGNOSIS — R82998 Other abnormal findings in urine: Secondary | ICD-10-CM | POA: Diagnosis not present

## 2020-02-03 DIAGNOSIS — E785 Hyperlipidemia, unspecified: Secondary | ICD-10-CM | POA: Diagnosis not present

## 2020-02-03 DIAGNOSIS — I739 Peripheral vascular disease, unspecified: Secondary | ICD-10-CM | POA: Diagnosis not present

## 2020-02-10 DIAGNOSIS — Z1212 Encounter for screening for malignant neoplasm of rectum: Secondary | ICD-10-CM | POA: Diagnosis not present

## 2020-02-28 DIAGNOSIS — H5203 Hypermetropia, bilateral: Secondary | ICD-10-CM | POA: Diagnosis not present

## 2020-02-28 DIAGNOSIS — H2513 Age-related nuclear cataract, bilateral: Secondary | ICD-10-CM | POA: Diagnosis not present

## 2020-03-03 DIAGNOSIS — Z20822 Contact with and (suspected) exposure to covid-19: Secondary | ICD-10-CM | POA: Diagnosis not present

## 2020-04-14 ENCOUNTER — Encounter: Payer: Self-pay | Admitting: Cardiovascular Disease

## 2020-04-14 ENCOUNTER — Other Ambulatory Visit: Payer: Self-pay

## 2020-04-14 ENCOUNTER — Ambulatory Visit: Payer: PPO | Admitting: Cardiovascular Disease

## 2020-04-14 VITALS — BP 140/72 | HR 81 | Ht 72.0 in | Wt 168.8 lb

## 2020-04-14 DIAGNOSIS — I739 Peripheral vascular disease, unspecified: Secondary | ICD-10-CM | POA: Diagnosis not present

## 2020-04-14 DIAGNOSIS — E782 Mixed hyperlipidemia: Secondary | ICD-10-CM | POA: Diagnosis not present

## 2020-04-14 NOTE — Progress Notes (Signed)
lipid

## 2020-04-14 NOTE — Assessment & Plan Note (Signed)
History of PAD with known occluded right popliteal artery and right ABI of 0.58.  Initially he had an excellent response to Pletal which she since discontinued although he no longer has claudication.

## 2020-04-14 NOTE — Assessment & Plan Note (Signed)
History of hyperlipidemia now on high-dose statin therapy with recent lipid profile performed 02/01/2020 while on a lower dose revealing total cholesterol 183, LDL of 112 and HDL of 56.  Given his coronary calcium score of 43 I prefer his LDL be closer to 70.  We will recheck a lipid liver profile in the next week or 2.

## 2020-04-14 NOTE — Patient Instructions (Signed)
  Lab Work:  Your physician recommends that you return for lab work FASTING  If you have labs (blood work) drawn today and your tests are completely normal, you will receive your results only by: MyChart Message (if you have MyChart) OR A paper copy in the mail If you have any lab test that is abnormal or we need to change your treatment, we will call you to review the results.   Follow-Up: At [facility], you and your health needs are our priority.  As part of our continuing mission to provide you with exceptional heart care, we have created designated Provider Care Teams.  These Care Teams include your primary Cardiologist (physician) and Advanced Practice Providers (APPs -  Physician Assistants and Nurse Practitioners) who all work together to provide you with the care you need, when you need it.  We recommend signing up for the patient portal called "MyChart".  Sign up information is provided on this After Visit Summary.  MyChart is used to connect with patients for Virtual Visits (Telemedicine).  Patients are able to view lab/test results, encounter notes, upcoming appointments, etc.  Non-urgent messages can be sent to your provider as well.   To learn more about what you can do with MyChart, go to [email or web address].    Your next appointment:   12 month(s)  The format for your next appointment:   In Person  Provider:   Jonathan Berry, MD   

## 2020-04-14 NOTE — Progress Notes (Signed)
04/14/2020 [name]   Jan 19, 1951  [id number]  Primary Physician Wyatt Margarita, DO Primary Cardiologist: Wyatt Harp MD [name], [place]  HPI:  [name] is a 69 y.o.  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 He was referred by Dr. Nyoka Jackson, his PCP, for evaluation and treatment of claudication.  I last saw him in the office 09/14/2019.His only risk factors are of treated hyperlipidemia. He does not smoke. There is no family history of heart disease. Is never had a heart attack or stroke. He denies chest pain or shortness of breath. He is very active and exercises on a daily basis at a gym with a trainer. He also uses free weights. He developed right calf claudication approxi-1 month ago which has been lifestyle limiting. Recent Dopplers performed 06/10/2019 revealed a right ABI of 0.58 with an occluded right popliteal artery.   I did begin him on Pletal 50 mg p.o. twice daily.  He continues to walk and his claudication is all but resolved.  His PCP did begin him on a higher dose of atorvastatin (10 mg initially up to 40 mg).  I also performed a coronary calcium score which was 43 with mild scattered coronary calcifications.  He denies chest pain or shortness of breath.  Since I saw him 6 months ago he has stopped his Pletal but no longer complains of claudication.  He is fairly active and works out on an elliptical as well as multiple machines and walks outside without limitation.  He denies chest pain or shortness of breath.  His PCP just increase his atorvastatin from 40 to 80 mg a day and we will recheck with a goal of less than 70 given his elevated coronary calcium score.   Current Meds  Medication Sig  . atorvastatin (LIPITOR) 10 MG tablet Take 10 mg by mouth daily.     Allergies  Allergen Reactions  . Penicillins     Social History   Socioeconomic History  .  Marital status: Married    Spouse name: [name]  . Number of children: [name]  . Years of education: [name]  . Highest education level: [name]  Occupational History  . [name]  Tobacco Use  . Smoking status: Never Smoker  . Smokeless tobacco: Never Used  Vaping Use  . Vaping Use: Never used  Substance and Sexual Activity  . Alcohol use: Yes    Alcohol/week: 0.0 standard drinks  . Drug use: Never  . Sexual activity: [name]  Other Topics Concern  . [name]  Social History Narrative  . [name]   Social Determinants of Health   Financial Resource Strain:   . Difficulty of Paying Living Expenses: [name]  Food Insecurity:   . Worried About Charity fundraiser in the Last Year: [name]  . Ran Out of Food in the Last Year: [name]  Transportation Needs:   . Lack of Transportation (Medical): [name]  . Lack of Transportation (Non-Medical): [name]  Physical Activity:   . Days of Exercise per Week: [name]  . Minutes of Exercise per Session: [name]  Stress:   . Feeling of Stress : [name]  Social Connections:   . Frequency of Communication with Friends  and Family: [name]  . Frequency of Social Gatherings with Friends and Family: [name]  . Attends Religious Services: [name]  . Active Member of Clubs or Organizations: [name]  . Attends Archivist Meetings: [name]  . Marital Status: [name]  Intimate Partner Violence:   . Fear of Current or Ex-Partner: [name]  . Emotionally Abused: [name]  . Physically Abused: [name]  . Sexually Abused: [name]     Review of Systems: General: negative for chills, fever, night sweats or weight changes.  Cardiovascular: negative for chest pain, dyspnea on exertion, edema, orthopnea, palpitations, paroxysmal nocturnal dyspnea or shortness of breath Dermatological: negative for rash Respiratory: negative for cough or  wheezing Urologic: negative for hematuria Abdominal: negative for nausea, vomiting, diarrhea, bright red blood per rectum, melena, or hematemesis Neurologic: negative for visual changes, syncope, or dizziness All other systems reviewed and are otherwise negative except as noted above.    Blood pressure 140/72, pulse 81, height 6' (1.829 m), weight 168 lb 12.8 oz (76.6 kg), SpO2 96 %.  General appearance: alert and no distress Neck: no adenopathy, no carotid bruit, no JVD, supple, symmetrical, trachea midline and thyroid not enlarged, symmetric, no tenderness/mass/nodules Lungs: clear to auscultation bilaterally Heart: regular rate and rhythm, S1, S2 normal, no murmur, click, rub or gallop Extremities: extremities normal, atraumatic, no cyanosis or edema Pulses: 2+ and symmetric Skin: Skin color, texture, turgor normal. No rashes or lesions Neurologic: Alert and oriented X 3, normal strength and tone. Normal symmetric reflexes. Normal coordination and gait  EKG sinus rhythm at 81 without ST or T wave changes.  Personally reviewed this EKG.  ASSESSMENT AND PLAN:   Hyperlipidemia History of hyperlipidemia now on high-dose statin therapy with recent lipid profile performed 02/01/2020 while on a lower dose revealing total cholesterol 183, LDL of 112 and HDL of 56.  Given his coronary calcium score of 43 I prefer his LDL be closer to 70.  We will recheck a lipid liver profile in the next week or 2.  Claudication in peripheral vascular disease ([place]) History of PAD with known occluded right popliteal artery and right ABI of 0.58.  Initially he had an excellent response to Pletal which she since discontinued although he no longer has claudication.      Wyatt Harp MD FACP,FACC,FAHA, [facility] 04/14/2020 3:51 PM

## 2020-06-08 DIAGNOSIS — C44519 Basal cell carcinoma of skin of other part of trunk: Secondary | ICD-10-CM | POA: Diagnosis not present

## 2020-06-08 DIAGNOSIS — D485 Neoplasm of uncertain behavior of skin: Secondary | ICD-10-CM | POA: Diagnosis not present

## 2020-06-08 DIAGNOSIS — Z85828 Personal history of other malignant neoplasm of skin: Secondary | ICD-10-CM | POA: Diagnosis not present

## 2020-06-08 DIAGNOSIS — L570 Actinic keratosis: Secondary | ICD-10-CM | POA: Diagnosis not present

## 2020-06-08 DIAGNOSIS — C4441 Basal cell carcinoma of skin of scalp and neck: Secondary | ICD-10-CM | POA: Diagnosis not present

## 2020-06-08 DIAGNOSIS — L821 Other seborrheic keratosis: Secondary | ICD-10-CM | POA: Diagnosis not present

## 2020-06-20 DIAGNOSIS — R059 Cough, unspecified: Secondary | ICD-10-CM | POA: Diagnosis not present

## 2020-06-20 DIAGNOSIS — J069 Acute upper respiratory infection, unspecified: Secondary | ICD-10-CM | POA: Diagnosis not present

## 2020-06-20 DIAGNOSIS — Z1152 Encounter for screening for COVID-19: Secondary | ICD-10-CM | POA: Diagnosis not present

## 2020-06-20 DIAGNOSIS — R509 Fever, unspecified: Secondary | ICD-10-CM | POA: Diagnosis not present

## 2020-06-20 DIAGNOSIS — J40 Bronchitis, not specified as acute or chronic: Secondary | ICD-10-CM | POA: Diagnosis not present

## 2020-07-18 DIAGNOSIS — R972 Elevated prostate specific antigen [PSA]: Secondary | ICD-10-CM | POA: Diagnosis not present

## 2020-07-28 DIAGNOSIS — Z85828 Personal history of other malignant neoplasm of skin: Secondary | ICD-10-CM | POA: Diagnosis not present

## 2020-07-28 DIAGNOSIS — C4441 Basal cell carcinoma of skin of scalp and neck: Secondary | ICD-10-CM | POA: Diagnosis not present

## 2020-08-01 ENCOUNTER — Encounter: Payer: Self-pay | Admitting: Physician Assistant

## 2020-08-01 ENCOUNTER — Telehealth: Payer: Self-pay | Admitting: Cardiovascular Disease

## 2020-08-01 ENCOUNTER — Ambulatory Visit: Payer: PPO | Admitting: Physician Assistant

## 2020-08-01 ENCOUNTER — Other Ambulatory Visit: Payer: Self-pay

## 2020-08-01 VITALS — BP 132/74 | HR 78 | Ht 72.0 in | Wt 170.4 lb

## 2020-08-01 DIAGNOSIS — I739 Peripheral vascular disease, unspecified: Secondary | ICD-10-CM | POA: Diagnosis not present

## 2020-08-01 DIAGNOSIS — E785 Hyperlipidemia, unspecified: Secondary | ICD-10-CM

## 2020-08-01 DIAGNOSIS — E782 Mixed hyperlipidemia: Secondary | ICD-10-CM | POA: Diagnosis not present

## 2020-08-01 MED ORDER — CILOSTAZOL 50 MG PO TABS: 50.0000 mg | ORAL_TABLET | Freq: Two times a day (BID) | ORAL | 6 refills | Status: DC

## 2020-08-01 NOTE — Patient Instructions (Signed)
Medication Instructions:  START PLETAL 50MG  TWICE DAILY *If you need a refill on your cardiac medications before your next appointment, please call your pharmacy*  Lab Work: NONE  Testing/Procedures: Your physician has requested that you have an ankle brachial index (ABI). During this test an ultrasound and blood pressure cuff are used to evaluate the arteries that supply the arms and legs with blood. Allow thirty minutes for this exam. There are no restrictions or special instructions.  Your physician has requested that you have a lower extremity arterial  duplex. During this test, an ultrasound are used to evaluate arterial blood flow in the legs. There are no restrictions or special instructions.  Follow-Up: Your next appointment:  2 month(s) In Person with Quay Burow, MD ONLY  At [facility], you and your health needs are our priority.  As part of our continuing mission to provide you with exceptional heart care, we have created designated Provider Care Teams.  These Care Teams include your primary Cardiologist (physician) and Advanced Practice Providers (APPs -  Physician Assistants and Nurse Practitioners) who all work together to provide you with the care you need, when you need it.

## 2020-08-01 NOTE — Progress Notes (Signed)
Cardiology Office Note:    Date:  08/03/2020   ID:  Malyk, [place] [birth year], MRN [id number]  PCP:  [name], [place] Cardiologist:  Quay Burow, MD  [place] Electrophysiologist:  None   Referring MD: [name], DO   Chief Complaint  Patient presents with  . Follow-up    Seen for Dr. Gwenlyn Found, right leg pain    History of Present Illness:    [name] is a 70 y.o. male with a hx of hyperlipidemia and PAD.  Previous Doppler obtained in December 2020 revealed a right ABI 0.58 with occluded right popliteal artery.  He was started on Pletal with resolution of claudication symptoms.  Coronary calcium score obtained on 06/30/2019 was 43 with scattered coronary calcification, this placed the patient at the 31st percentile for age and sex matched control.  There was a small hypodensity noted in the liver.  He was last seen by Dr. Gwenlyn Found on 04/14/2020 at which time he did not have significant claudication symptoms even though he came off of pletal, therefore 1 year follow-up was recommended.  For the past 2 weeks, he has been having increasing claudication symptom.  He typically noticed the claudication 6 minutes into exercise.  On exam, he has noticeably weaker pulse in the right lower extremity when compared to the left lower extremity.  He only has claudication symptom in the right side, never in the left side.  For 2 days last week, he also noticed some numbness in the right lower extremity when he is laying on the right side at night.  He denies any claudication pain when he is lying on the side.  I recommended restart Pletal 50 mg twice a day.  He will need a repeat ABI and LEA doppler.  I recommended 34-month follow-up with Dr. Gwenlyn Found for reassessment of claudication symptom.   Past Medical History:  Diagnosis Date  . Hyperlipidemia   . PAD (peripheral artery disease) ([place])     Past Surgical History:  Procedure Laterality Date  . TONSILLECTOMY AND  ADENOIDECTOMY      Current Medications: Current Meds  Medication Sig  . atorvastatin (LIPITOR) 10 MG tablet Take 10 mg by mouth daily.  . cilostazol (PLETAL) 50 MG tablet Take 1 tablet (50 mg total) by mouth 2 (two) times daily.     Allergies:   Penicillins   Social History   Socioeconomic History  . Marital status: Married    Spouse name: [name]  . Number of children: [name]  . Years of education: [name]  . Highest education level: [name]  Occupational History  . [name]  Tobacco Use  . Smoking status: Never Smoker  . Smokeless tobacco: Never Used  Vaping Use  . Vaping Use: Never used  Substance and Sexual Activity  . Alcohol use: Yes    Alcohol/week: 0.0 standard drinks  . Drug use: Never  . Sexual activity: [name]  Other Topics Concern  . [name]  Social History Narrative  . [name]   Social Determinants of Health   Financial Resource Strain: [name]  Food Insecurity: [name]  Transportation Needs: [name]  Physical Activity: [name]  Stress: [name]  Social Connections: [name]     Family History: The patient's family history is not on file.  ROS:   Please see the history of present  illness.     All other systems reviewed and are negative.  EKGs/Labs/Other Studies Reviewed:    The following studies were reviewed today:  LEA doppler 06/10/2019 Summary:  Right: Total occlusion noted in the deep femoral artery. Total occlusion  noted in the popliteal artery. Total occlusion noted in the posterior  tibial artery. Total occlusion noted in the peroneal artery.   EKG:  EKG is not ordered today.   Recent Labs: 08/01/2020: ALT 25  Recent Lipid Panel    Component Value Date/Time   CHOL 179 08/01/2020 1545   TRIG 85 08/01/2020 1545   HDL 52 08/01/2020 1545   CHOLHDL 3.4 08/01/2020 1545   LDLCALC 111 (H) 08/01/2020 1545     Risk Assessment/Calculations:       Physical Exam:    VS:   BP 132/74 (BP Location: Left Arm, Patient Position: Sitting, Cuff Size: Normal)   Pulse 78   Ht 6' (1.829 m)   Wt 170 lb 6.4 oz (77.3 kg)   SpO2 96%   BMI 23.11 kg/m     Wt Readings from Last 3 Encounters:  08/01/20 170 lb 6.4 oz (77.3 kg)  04/14/20 168 lb 12.8 oz (76.6 kg)  09/14/19 174 lb 6.4 oz (79.1 kg)     GEN:  Well nourished, well developed in no acute distress HEENT: Normal NECK: No JVD; No carotid bruits LYMPHATICS: No lymphadenopathy CARDIAC: RRR, no murmurs, rubs, gallops RESPIRATORY:  Clear to auscultation without rales, wheezing or rhonchi  ABDOMEN: Soft, non-tender, non-distended MUSCULOSKELETAL:  No edema; No deformity  SKIN: Warm and dry NEUROLOGIC:  Alert and oriented x 3 PSYCHIATRIC:  Normal affect   ASSESSMENT:    1. Claudication in peripheral vascular disease ([place])   2. PAD (peripheral artery disease) ([facility])    PLAN:    In order of problems listed above:  1. Claudication symptom: Induced by exercise, roughly 6-minute into activity, he would experience tightness in the right lower extremity.  He has known occluded right popliteal artery based on previous Doppler in December 2020.  He was previously on Pletal, however came off of this medication.  I recommend to restart Pletal at 50 mg twice a day.  He will also need a repeat ABI and LEA doppler  2. Hyperlipidemia: Continue Lipitor    Medication Adjustments/Labs and Tests Ordered: Current medicines are reviewed at length with the patient today.  Concerns regarding medicines are outlined above.  Orders Placed This Encounter  Procedures  . VAS [place] ABI WITH/WO TBI  . VAS [place] LOWER EXTREMITY ARTERIAL DUPLEX   Meds ordered this encounter  Medications  . cilostazol (PLETAL) 50 MG tablet    Sig: Take 1 tablet (50 mg total) by mouth 2 (two) times daily.    Dispense:  60 tablet    Refill:  6    Patient Instructions  Medication Instructions:  START PLETAL 50MG  TWICE DAILY *If you need a refill on your  cardiac medications before your next appointment, please call your pharmacy*  Lab Work: NONE  Testing/Procedures: Your physician has requested that you have an ankle brachial index (ABI). During this test an ultrasound and blood pressure cuff are used to evaluate the arteries that supply the arms and legs with blood. Allow thirty minutes for this exam. There are no restrictions or special instructions.  Your physician has requested that you have a lower extremity arterial  duplex. During this test, an ultrasound are used to evaluate arterial blood flow in the legs. There are  no restrictions or special instructions.  Follow-Up: Your next appointment:  2 month(s) In Person with Quay Burow, MD ONLY  At [facility], you and your health needs are our priority.  As part of our continuing mission to provide you with exceptional heart care, we have created designated Provider Care Teams.  These Care Teams include your primary Cardiologist (physician) and Advanced Practice Providers (APPs -  Physician Assistants and Nurse Practitioners) who all work together to provide you with the care you need, when you need it.     Hilbert Corrigan, [place]  08/03/2020 9:34 PM    [facility]

## 2020-08-01 NOTE — Telephone Encounter (Signed)
Spoke with patient. He reports the pain in his right leg is reoccurring. It came on all of a sudden. The pain is in his right calf and at night his foot goes numb. He had a prescription for this in the past but discussed with Dr. Gwenlyn Found at last appointment and decided to discontinue the medication. Cannot recall the name. Leg is not hot or red. He is going out of town next week and requests to be seen to assess the leg to make sure it's nothing serious. Appointment made with Almyra Deforest, PA today at 2:45pm.

## 2020-08-01 NOTE — Telephone Encounter (Signed)
[name] is calling to speak with a nurse stating he has been having pain in his right calf. He also states his foot has started going numb at night. This has been going on for the past 3-4 days, but no other symptoms are present. Please advise.

## 2020-08-02 LAB — HEPATIC FUNCTION PANEL
ALT: 25 IU/L (ref 0–44)
AST: 25 IU/L (ref 0–40)
Albumin: 3.6 g/dL — ABNORMAL LOW (ref 3.7–4.7)
Alkaline Phosphatase: 109 IU/L (ref 44–121)
Bilirubin Total: 0.5 mg/dL (ref 0.0–1.2)
Bilirubin, Direct: 0.16 mg/dL (ref 0.00–0.40)
Total Protein: 6.2 g/dL (ref 6.0–8.5)

## 2020-08-02 LAB — LIPID PANEL
Chol/HDL Ratio: 3.4 ratio (ref 0.0–5.0)
Cholesterol, Total: 179 mg/dL (ref 100–199)
HDL: 52 mg/dL (ref >39)
LDL Chol Calc (NIH): 111 mg/dL — ABNORMAL HIGH (ref 0–99)
Triglycerides: 85 mg/dL (ref 0–149)
VLDL Cholesterol Cal: 16 mg/dL (ref 5–40)

## 2020-08-03 ENCOUNTER — Encounter: Payer: Self-pay | Admitting: Physician Assistant

## 2020-08-04 ENCOUNTER — Ambulatory Visit (HOSPITAL_COMMUNITY)
Admission: RE | Admit: 2020-08-04 | Discharge: 2020-08-04 | Disposition: A | Discharge status: Home / Self Care | Payer: PPO | Source: Ambulatory Visit | Attending: Internal Medicine | Admitting: Internal Medicine

## 2020-08-04 ENCOUNTER — Other Ambulatory Visit: Payer: Self-pay

## 2020-08-04 DIAGNOSIS — I739 Peripheral vascular disease, unspecified: Secondary | ICD-10-CM | POA: Diagnosis not present

## 2020-08-08 ENCOUNTER — Ambulatory Visit: Payer: PPO | Admitting: Cardiovascular Disease

## 2020-08-08 ENCOUNTER — Telehealth: Payer: Self-pay | Admitting: Physician Assistant

## 2020-08-08 NOTE — Telephone Encounter (Signed)
I spoke with Mr. Laduca regarding the recent Doppler result.  He has had a significant progression of his peripheral arterial disease.  Fortunately, he does not have any resting leg pain.  His pain only occurs with physical exertion.  We discussed extensively regarding the symptom and signs of critical limb ischemia such as discoloration including pale versus bluish changes to the lower extremity skin.  He is aware that if he started having discolorations or resting pain in the leg that will not go away, he will need to go to the emergency room immediately in order to undergo urgent vascular procedure.  Otherwise, earlier visit has been scheduled with Dr. Gwenlyn Found this month.  Note, he is currently in [place] and denies any significant claudication symptoms at rest.

## 2020-08-09 ENCOUNTER — Telehealth: Payer: Self-pay

## 2020-08-09 NOTE — Telephone Encounter (Signed)
Called and lmomed the pt that we need to schedule pharmd appt

## 2020-08-09 NOTE — Telephone Encounter (Signed)
-----   Message from Rollen Sox, [facility] sent at 08/09/2020  7:45 AM EST ----- Regarding: FW: new pt  ----- Message ----- From: Beatrix Fetters, RN Sent: 08/09/2020   7:41 AM EST To: Cv Div Pharmd, Cv Div Nl Scheduling Subject: new pt                                         Pt needs a OV scheduled with a PharmD to discuss starting repatha.  Thanks,  Rexanne Mano

## 2020-08-14 NOTE — Telephone Encounter (Signed)
Sounds like it is related to blood flow issue, dangling the effected leg off the side of the bed at night will help since gravity will pull more blood down the leg, but it is not a permanent fix. A more permanent solution is still vascular procedure which can be arranged by Dr. Gwenlyn Found following the upcoming visit. If there is persistent pain that won't go away or persistent change in the color of the leg, then he needs to go to the hospital immediately.

## 2020-08-14 NOTE — Telephone Encounter (Signed)
Thx.  JJB 

## 2020-08-14 NOTE — Telephone Encounter (Signed)
I have called [name], all questions answered

## 2020-08-18 ENCOUNTER — Other Ambulatory Visit: Payer: Self-pay

## 2020-08-18 ENCOUNTER — Ambulatory Visit: Payer: PPO | Admitting: Cardiovascular Disease

## 2020-08-18 VITALS — BP 151/62 | HR 88 | Ht 72.0 in | Wt 165.2 lb

## 2020-08-18 DIAGNOSIS — Z01812 Encounter for preprocedural laboratory examination: Secondary | ICD-10-CM | POA: Diagnosis not present

## 2020-08-18 DIAGNOSIS — I739 Peripheral vascular disease, unspecified: Secondary | ICD-10-CM | POA: Diagnosis not present

## 2020-08-18 LAB — CBC
MCV: 91 fL (ref 79–97)
Platelets: 295 10*3/uL (ref 150–450)
RDW: 11.6 % (ref 11.6–15.4)

## 2020-08-18 NOTE — H&P (View-Only) (Signed)
08/18/2020 [name]   1950-07-27  [id number]  Primary Physician [name], DO Primary Cardiologist: [name] MD Lupe Carney, [place]  HPI:  [name] is a 70 y.o.  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 He was referred by Dr. Nyoka Cowden, his PCP, for evaluation and treatment of claudication.I last saw him in the office  04/14/2020.His only risk factors are of treated hyperlipidemia. He does not smoke. There is no family history of heart disease. Is never had a heart attack or stroke. He denies chest pain or shortness of breath. He is very active and exercises on a daily basis at a gym with a trainer. He also uses free weights. He developed right calf claudication approxi-1 month ago which has been lifestyle limiting. Recent Dopplers performed 06/10/2019 revealed a right ABI of0.58 with an occluded right popliteal artery.   I did begin him on Pletal 50 mg p.o. twice daily. He continues to walk and his claudication is all but resolved. His PCP did begin him on a higher dose of atorvastatin (10 mg initially up to 40 mg). I also performed a coronary calcium score which was 43 with mild scattered coronary calcifications. He denies chest pain or shortness of breath.  I did begin him on Pletal this afforded him considerable relief from his claudication.  He is fairly active and works out on an elliptical as well as multiple machines and walks outside without limitation.  He denies chest pain or shortness of breath.  His PCP just increase his atorvastatin from 40 to 80 mg a day and we will recheck with a goal of less than 70 given his elevated coronary calcium score.  Repeat of his cholesterol performed 08/01/2020 revealed total cholesterol 179, LDL 111 and HDL 52 on atorvastatin 80 mg.  He has appointment to see Dr. Debara Pickett most likely will begin a PCSK9 inhibitor.  Over the  last several months he is noticed progressive claudication of his right calf.  He is now symptomatic.  Dopplers performed 07/27/2020 revealed extension of his disease to more proximal level now with what appears to be occlusion of his right distal common femoral, SFA popliteal and tibial vessels.  He is now at the point that he requires angiography potential revascularization.   Current Meds  Medication Sig  . atorvastatin (LIPITOR) 10 MG tablet Take 10 mg by mouth daily.  . cilostazol (PLETAL) 50 MG tablet Take 1 tablet (50 mg total) by mouth 2 (two) times daily.     Allergies  Allergen Reactions  . Penicillins     Social History   Socioeconomic History  . Marital status: Married    Spouse name: [name]  . Number of children: [name]  . Years of education: [name]  . Highest education level: [name]  Occupational History  . [name]  Tobacco Use  . Smoking status: Never Smoker  . Smokeless tobacco: Never Used  Vaping Use  . Vaping Use: Never used  Substance and Sexual Activity  . Alcohol use: Yes    Alcohol/week: 0.0 standard drinks  . Drug use: Never  . Sexual activity: [name]  Other Topics Concern  . [name]  Social History Narrative  . [name]   Social Determinants of Health   Financial Resource Strain: [name]  Food Insecurity: [name]  Transportation Needs: [name]  Physical Activity: [name]  Stress: [name]  Social Connections: [name]  Intimate Partner Violence: [name]     Review of Systems: General: negative for chills, fever, night sweats or weight changes.  Cardiovascular: negative for chest pain, dyspnea on exertion, edema, orthopnea, palpitations, paroxysmal nocturnal dyspnea or shortness of breath Dermatological: negative for rash Respiratory: negative for cough or wheezing Urologic: negative for hematuria Abdominal: negative for nausea, vomiting, diarrhea, bright red blood per rectum,  melena, or hematemesis Neurologic: negative for visual changes, syncope, or dizziness All other systems reviewed and are otherwise negative except as noted above.    Blood pressure (!) 151/62, pulse 88, height 6' (1.829 m), weight 165 lb 3.2 oz (74.9 kg), SpO2 96 %.  General appearance: alert and no distress Neck: no adenopathy, no carotid bruit, no JVD, supple, symmetrical, trachea midline and thyroid not enlarged, symmetric, no tenderness/mass/nodules Lungs: clear to auscultation bilaterally Heart: regular rate and rhythm, S1, S2 normal, no murmur, click, rub or gallop Extremities: extremities normal, atraumatic, no cyanosis or edema Pulses: 2+ and symmetric Absent Skin: Skin color, texture, turgor normal. No rashes or lesions Neurologic: Alert and oriented X 3, normal strength and tone. Normal symmetric reflexes. Normal coordination and gait  EKG sinus rhythm at 92 without ST or T wave changes.  I personally reviewed this EKG.  ASSESSMENT AND PLAN:   Claudication in peripheral vascular disease ([place]) History of PAD now with symptomatic claudication Dopplers that revealed progression of disease on the right side.  He appears to have an occluded distal common femoral, SFA popliteal and tibial vessels.  He is no longer responsive to Pletal.  I'm going to perform peripheral angiography potential endovascular therapy on Monday, March 7.      [name] MD [facility], [facility] 08/18/2020 11:24 AM

## 2020-08-18 NOTE — Progress Notes (Signed)
08/18/2020 [name]   15-Jan-1951  [id number]  Primary Physician Wyatt Margarita, DO Primary Cardiologist: Wyatt Harp MD [name]  HPI:  [name] is a 70 y.o.  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 He was referred by [name], his PCP, for evaluation and treatment of claudication.I last saw him in the office  04/14/2020.His only risk factors are of treated hyperlipidemia. He does not smoke. There is no family history of heart disease. Is never had a heart attack or stroke. He denies chest pain or shortness of breath. He is very active and exercises on a daily basis at a gym with a trainer. He also uses free weights. He developed right calf claudication approxi-1 month ago which has been lifestyle limiting. Recent Dopplers performed 06/10/2019 revealed a right ABI of0.58 with an occluded right popliteal artery.   I did begin him on Pletal 50 mg p.o. twice daily. He continues to walk and his claudication is all but resolved. His PCP did begin him on a higher dose of atorvastatin (10 mg initially up to 40 mg). I also performed a coronary calcium score which was 43 with mild scattered coronary calcifications. He denies chest pain or shortness of breath.  I did begin him on Pletal this afforded him considerable relief from his claudication.  He is fairly active and works out on an elliptical as well as multiple machines and walks outside without limitation.  He denies chest pain or shortness of breath.  His PCP just increase his atorvastatin from 40 to 80 mg a day and we will recheck with a goal of less than 70 given his elevated coronary calcium score.  Repeat of his cholesterol performed 08/01/2020 revealed total cholesterol 179, LDL 111 and HDL 52 on atorvastatin 80 mg.  He has appointment to see [name] most likely will begin a PCSK9 inhibitor.  Over the  last several months he is noticed progressive claudication of his right calf.  He is now symptomatic.  Dopplers performed 07/27/2020 revealed extension of his disease to more proximal level now with what appears to be occlusion of his right distal common femoral, SFA popliteal and tibial vessels.  He is now at the point that he requires angiography potential revascularization.   Current Meds  Medication Sig  . atorvastatin (LIPITOR) 10 MG tablet Take 10 mg by mouth daily.  . cilostazol (PLETAL) 50 MG tablet Take 1 tablet (50 mg total) by mouth 2 (two) times daily.     Allergies  Allergen Reactions  . Penicillins     Social History   Socioeconomic History  . Marital status: Married    Spouse name: [name]  . Number of children: [name]  . Years of education: [name]  . Highest education level: [name]  Occupational History  . [name]  Tobacco Use  . Smoking status: Never Smoker  . Smokeless tobacco: Never Used  Vaping Use  . Vaping Use: Never used  Substance and Sexual Activity  . Alcohol use: Yes    Alcohol/week: 0.0 standard drinks  . Drug use: Never  . Sexual activity: [name]  Other Topics Concern  . [name]  Social History Narrative  . [name]   Social Determinants of Health   Financial Resource Strain: [name]  Food Insecurity: [name]  Transportation Needs: [name]  Physical Activity: [name]  Stress: [name]  Social Connections: [name]  Intimate Partner Violence: [name]     Review of Systems: General: negative for chills, fever, night sweats or weight changes.  Cardiovascular: negative for chest pain, dyspnea on exertion, edema, orthopnea, palpitations, paroxysmal nocturnal dyspnea or shortness of breath Dermatological: negative for rash Respiratory: negative for cough or wheezing Urologic: negative for hematuria Abdominal: negative for nausea, vomiting, diarrhea, bright red blood per rectum,  melena, or hematemesis Neurologic: negative for visual changes, syncope, or dizziness All other systems reviewed and are otherwise negative except as noted above.    Blood pressure (!) 151/62, pulse 88, height 6' (1.829 m), weight 165 lb 3.2 oz (74.9 kg), SpO2 96 %.  General appearance: alert and no distress Neck: no adenopathy, no carotid bruit, no JVD, supple, symmetrical, trachea midline and thyroid not enlarged, symmetric, no tenderness/mass/nodules Lungs: clear to auscultation bilaterally Heart: regular rate and rhythm, S1, S2 normal, no murmur, click, rub or gallop Extremities: extremities normal, atraumatic, no cyanosis or edema Pulses: 2+ and symmetric Absent Skin: Skin color, texture, turgor normal. No rashes or lesions Neurologic: Alert and oriented X 3, normal strength and tone. Normal symmetric reflexes. Normal coordination and gait  EKG sinus rhythm at 92 without ST or T wave changes.  I personally reviewed this EKG.  ASSESSMENT AND PLAN:   Claudication in peripheral vascular disease ([place]) History of PAD now with symptomatic claudication Dopplers that revealed progression of disease on the right side.  He appears to have an occluded distal common femoral, SFA popliteal and tibial vessels.  He is no longer responsive to Pletal.  I'm going to perform peripheral angiography potential endovascular therapy on Monday, March 7.      Wyatt Harp MD [facility] 08/18/2020 11:24 AM

## 2020-08-18 NOTE — Assessment & Plan Note (Signed)
History of PAD now with symptomatic claudication Dopplers that revealed progression of disease on the right side.  He appears to have an occluded distal common femoral, SFA popliteal and tibial vessels.  He is no longer responsive to Pletal.  I'm going to perform peripheral angiography potential endovascular therapy on Monday, March 7.

## 2020-08-18 NOTE — Patient Instructions (Addendum)
    [name] [id number] Dept: [phone] Loc: [phone]  [name]  08/18/2020  You are scheduled for a Peripheral Angiogram on Monday, March 7 with Dr. Quay Burow.  1. Please arrive at the [facility] (Main Entrance A) at [facility]: [address] [place], [place] [address] at 5:30 AM (This time is two hours before your procedure to ensure your preparation). Free valet parking service is available.   Special note: Every effort is made to have your procedure done on time. Please understand that emergencies sometimes delay scheduled procedures.  2. Diet: Do not eat solid foods after midnight.  The patient may have clear liquids until 5am upon the day of the procedure.  3. Labs: today in office  You will need a COVID-19  test prior to your procedure. You are scheduled for Friday 3/4at 8:45 AM. This is a Drive Up Visit at [address] [place], [place] [address]. Someone will direct you to the appropriate testing line. Stay in your car and someone will be with you shortly.  4. Medication instructions in preparation for your procedure:   Contrast Allergy: No  On the morning of your procedure, take your Aspirin and any morning medicines NOT listed above.  You may use sips of water.  5. Plan for one night stay--bring personal belongings. 6. Bring a current list of your medications and current insurance cards. 7. You MUST have a responsible person to drive you home. 8. Someone MUST be with you the first 24 hours after you arrive home or your discharge will be delayed. 9. Please wear clothes that are easy to get on and off and wear slip-on shoes.  Thank you for allowing [place] to care for you!   -- [facility]    Your physician has requested that you have a lower extremity arterial duplex 1 week after procedure. This  test is an ultrasound of the arteries in the legs. It looks at arterial blood flow in the legs. Allow one hour for Lower Arterial scans. There are no restrictions or special instructions   Follow up with Dr. Gwenlyn Found 2 weeks after procedure

## 2020-08-19 LAB — CBC
Hematocrit: 45.6 % (ref 37.5–51.0)
Hemoglobin: 15.8 g/dL (ref 13.0–17.7)
MCH: 31.6 pg (ref 26.6–33.0)
MCHC: 34.6 g/dL (ref 31.5–35.7)
RBC: 5 x10E6/uL (ref 4.14–5.80)
WBC: 9.8 10*3/uL (ref 3.4–10.8)

## 2020-08-19 LAB — BASIC METABOLIC PANEL
BUN/Creatinine Ratio: 15 (ref 10–24)
BUN: 15 mg/dL (ref 8–27)
CO2: 23 mmol/L (ref 20–29)
Calcium: 9.5 mg/dL (ref 8.6–10.2)
Chloride: 100 mmol/L (ref 96–106)
Creatinine, Ser: 1 mg/dL (ref 0.76–1.27)
GFR calc Af Amer: 87 mL/min/{1.73_m2} (ref >59)
GFR calc non Af Amer: 75 mL/min/{1.73_m2} (ref >59)
Glucose: 108 mg/dL — ABNORMAL HIGH (ref 65–99)
Potassium: 5.1 mmol/L (ref 3.5–5.2)
Sodium: 139 mmol/L (ref 134–144)

## 2020-08-22 ENCOUNTER — Ambulatory Visit: Payer: PPO | Admitting: Pharmacist

## 2020-08-22 ENCOUNTER — Other Ambulatory Visit: Payer: Self-pay | Admitting: *Deleted

## 2020-08-22 ENCOUNTER — Other Ambulatory Visit: Payer: Self-pay

## 2020-08-22 VITALS — BP 140/66 | HR 79 | Resp 16 | Ht 72.0 in | Wt 166.8 lb

## 2020-08-22 DIAGNOSIS — E782 Mixed hyperlipidemia: Secondary | ICD-10-CM

## 2020-08-22 DIAGNOSIS — I739 Peripheral vascular disease, unspecified: Secondary | ICD-10-CM

## 2020-08-22 MED ORDER — SODIUM CHLORIDE 0.9% FLUSH: 3.0000 mL | Freq: Two times a day (BID) | INTRAVENOUS | Status: AC

## 2020-08-22 MED ORDER — ROSUVASTATIN CALCIUM 40 MG PO TABS: 40.0000 mg | ORAL_TABLET | Freq: Every evening | ORAL | 1 refills | Status: DC

## 2020-08-22 MED ORDER — EZETIMIBE 10 MG PO TABS: 10.0000 mg | ORAL_TABLET | Freq: Every day | ORAL | 1 refills | Status: DC

## 2020-08-22 NOTE — Progress Notes (Signed)
Patient ID: [name]                 DOB: 08-Jan-1951                    MRN: [id number]     HPI: [name] is a 70 y.o. male patient referred to lipid clinic by Dr. Gwenlyn Jackson. PMH is significant for PAD withsymptomatic claudication not responding to Pletal. Scheduled for peripheral angiography on March/7.   Patient present for potential PCSK9i initiation, but will like to discuss other options. He is not ready to start injectable products, and consider he may be able to control his cholesterol with lifestyle modifications, and oral medication.  Current Medications:  Atorvastatin 80mg  daily - rarely missed dose and take after supper  Intolerances: none  LDL goal: 70mg /dL  Diet: not adherence to any diet and npo restriction until last 3 weeks(oatmeal in  AM, chicken or [place] salad (burgers, fries, cheese and bacon),   Exercise: work out 3-4x sper week > 45 minutes  Family History: no family history of heart disease, MI or stroke  Social History: 3-4x/week , beer & [place]  Labs: 08/01/2020: CHO 179, TG 85, HDL 52, LDL-c 111 (atorvastatin 80mg )  Past Medical History:  Diagnosis Date  . Hyperlipidemia   . PAD (peripheral artery disease) ([place])     Current Outpatient Medications on File Prior to Visit  Medication Sig Dispense Refill  . cilostazol (PLETAL) 50 MG tablet Take 1 tablet (50 mg total) by mouth 2 (two) times daily. 60 tablet 6   No current facility-administered medications on file prior to visit.    Allergies  Allergen Reactions  . Penicillins     Hyperlipidemia LDL remiasn above goal for secondary prevention. Patient reports significant changes in diet in the last 2 weeks. His preference if to adjust oral medication before trial with PCPSk9i injectables.  Will STOP atorvastatin, START taking rosuvastatin 40mg  every evening, start ezetimibe 10mg  daily, and change diet to most plant based options. Will repeat fasting blood work in 6 weeks, and re-assess  therapy. He agrees on Repatha/Praleunt therapy if LDL remains above 70 in 6 weeks.   [name] PharmD, BCPS, [place] [address] 08/25/2020 3:26 PM

## 2020-08-22 NOTE — Patient Instructions (Addendum)
Your Results:             Your most recent labs Goal  Total Cholesterol 179 < 200  Triglycerides 85 < 150  HDL (good cholesterol) 52 > 40  LDL (bad cholesterol) 111 < 70     Medication changes: *STOP taking atorvastatin* *START taking rosuvastatin 40mg  every evening* *START taking ezetimibe 10mg  daily*  *Increase vegetable base diet and continue exercise as possible*   Clinic phone number: [place] G[place] : [phone]  Lab orders: *Repeat fasting blood work in 6 weeks*   Thank you for choosing [facility]

## 2020-08-25 ENCOUNTER — Encounter: Payer: Self-pay | Admitting: Pharmacist

## 2020-08-25 NOTE — Assessment & Plan Note (Signed)
LDL remiasn above goal for secondary prevention. Patient reports significant changes in diet in the last 2 weeks. His preference if to adjust oral medication before trial with PCPSk9i injectables.  Will STOP atorvastatin, START taking rosuvastatin 40mg  every evening, start ezetimibe 10mg  daily, and change diet to most plant based options. Will repeat fasting blood work in 6 weeks, and re-assess therapy. He agrees on Repatha/Praleunt therapy if LDL remains above 70 in 6 weeks.

## 2020-09-07 ENCOUNTER — Telehealth: Payer: Self-pay | Admitting: *Deleted

## 2020-09-07 ENCOUNTER — Other Ambulatory Visit (HOSPITAL_COMMUNITY): Payer: PPO

## 2020-09-07 NOTE — Telephone Encounter (Addendum)
Pt contacted pre-abdominal aortogram  scheduled at [facility] for: Monday September 11, 2020 7:30 AM Verified arrival time and place: [facility]) at: 5:30 AM   No solid food after midnight prior to cath, clear liquids until 5 AM day of procedure.   AM meds can be  taken pre-cath with sips of water including: ASA 81 mg   Confirmed patient has responsible adult to drive home post procedure and be with patient first 24 hours after arriving home: yes  You are allowed ONE visitor in the waiting room during the time you are at the hospital for your procedure. Both you and your visitor must wear a mask once you enter the hospital.   Reviewed procedure/mask/visitor instructions with patient.

## 2020-09-08 ENCOUNTER — Other Ambulatory Visit (HOSPITAL_COMMUNITY)
Admission: RE | Admit: 2020-09-08 | Discharge: 2020-09-08 | Disposition: A | Discharge status: Home / Self Care | Payer: PPO | Source: Ambulatory Visit | Attending: Cardiovascular Disease | Admitting: Cardiovascular Disease

## 2020-09-08 DIAGNOSIS — Z01812 Encounter for preprocedural laboratory examination: Secondary | ICD-10-CM | POA: Diagnosis not present

## 2020-09-08 DIAGNOSIS — Z20822 Contact with and (suspected) exposure to covid-19: Secondary | ICD-10-CM | POA: Diagnosis not present

## 2020-09-08 LAB — SARS CORONAVIRUS 2 (TAT 6-24 HRS): SARS Coronavirus 2: NEGATIVE

## 2020-09-11 ENCOUNTER — Encounter (HOSPITAL_COMMUNITY)
Admission: RE | Disposition: A | Discharge status: Home / Self Care | Payer: Self-pay | Source: Home / Self Care | Attending: Cardiovascular Disease

## 2020-09-11 ENCOUNTER — Ambulatory Visit (HOSPITAL_COMMUNITY)
Admission: RE | Admit: 2020-09-11 | Discharge: 2020-09-11 | Disposition: A | Discharge status: Home / Self Care | Payer: PPO | Attending: Cardiovascular Disease | Admitting: Cardiovascular Disease

## 2020-09-11 ENCOUNTER — Encounter (HOSPITAL_COMMUNITY): Payer: Self-pay | Admitting: Cardiovascular Disease

## 2020-09-11 DIAGNOSIS — Z79899 Other long term (current) drug therapy: Secondary | ICD-10-CM | POA: Diagnosis not present

## 2020-09-11 DIAGNOSIS — I70211 Atherosclerosis of native arteries of extremities with intermittent claudication, right leg: Secondary | ICD-10-CM | POA: Insufficient documentation

## 2020-09-11 DIAGNOSIS — I739 Peripheral vascular disease, unspecified: Secondary | ICD-10-CM | POA: Diagnosis not present

## 2020-09-11 DIAGNOSIS — Z880 Allergy status to penicillin: Secondary | ICD-10-CM | POA: Insufficient documentation

## 2020-09-11 HISTORY — PX: ABDOMINAL AORTOGRAM W/LOWER EXTREMITY: CATH118223

## 2020-09-11 SURGERY — ABDOMINAL AORTOGRAM W/LOWER EXTREMITY
Anesthesia: LOCAL

## 2020-09-11 MED ORDER — FENTANYL CITRATE (PF) 100 MCG/2ML IJ SOLN
INTRAMUSCULAR | Status: DC | PRN
  Administered 2020-09-11: 25 ug via INTRAVENOUS

## 2020-09-11 MED ORDER — FENTANYL CITRATE (PF) 100 MCG/2ML IJ SOLN
INTRAMUSCULAR | Status: AC
  Filled 2020-09-11: qty 2

## 2020-09-11 MED ORDER — SODIUM CHLORIDE 0.9% FLUSH: 3.0000 mL | INTRAVENOUS | Status: DC | PRN

## 2020-09-11 MED ORDER — LIDOCAINE HCL (PF) 1 % IJ SOLN
INTRAMUSCULAR | Status: DC | PRN
  Administered 2020-09-11: 15 mL via INTRADERMAL

## 2020-09-11 MED ORDER — SODIUM CHLORIDE 0.9% FLUSH: 3.0000 mL | Freq: Two times a day (BID) | INTRAVENOUS | Status: DC

## 2020-09-11 MED ORDER — SODIUM CHLORIDE 0.9 % IV SOLN: INTRAVENOUS | Status: DC

## 2020-09-11 MED ORDER — ATORVASTATIN CALCIUM 80 MG PO TABS: 80.0000 mg | ORAL_TABLET | Freq: Every day | ORAL | Status: DC

## 2020-09-11 MED ORDER — SODIUM CHLORIDE 0.9 % WEIGHT BASED INFUSION
3.0000 mL/kg/h | INTRAVENOUS | Status: AC
  Administered 2020-09-11: 3 mL/kg/h via INTRAVENOUS

## 2020-09-11 MED ORDER — ASPIRIN EC 81 MG PO TBEC: 81.0000 mg | DELAYED_RELEASE_TABLET | Freq: Every day | ORAL | Status: DC

## 2020-09-11 MED ORDER — IODIXANOL 320 MG/ML IV SOLN
INTRAVENOUS | Status: DC | PRN
  Administered 2020-09-11: 175 mL via INTRA_ARTERIAL

## 2020-09-11 MED ORDER — ONDANSETRON HCL 4 MG/2ML IJ SOLN: 4.0000 mg | Freq: Four times a day (QID) | INTRAMUSCULAR | Status: DC | PRN

## 2020-09-11 MED ORDER — LIDOCAINE HCL (PF) 1 % IJ SOLN
INTRAMUSCULAR | Status: AC
  Filled 2020-09-11: qty 30

## 2020-09-11 MED ORDER — ROSUVASTATIN CALCIUM 20 MG PO TABS: 40.0000 mg | ORAL_TABLET | Freq: Every evening | ORAL | Status: DC

## 2020-09-11 MED ORDER — ASPIRIN 81 MG PO CHEW: 81.0000 mg | CHEWABLE_TABLET | ORAL | Status: AC

## 2020-09-11 MED ORDER — LABETALOL HCL 5 MG/ML IV SOLN: 10.0000 mg | INTRAVENOUS | Status: DC | PRN

## 2020-09-11 MED ORDER — HEPARIN (PORCINE) IN NACL 1000-0.9 UT/500ML-% IV SOLN
INTRAVENOUS | Status: DC | PRN
  Administered 2020-09-11 (×2): 500 mL

## 2020-09-11 MED ORDER — SODIUM CHLORIDE 0.9 % IV SOLN: 250.0000 mL | INTRAVENOUS | Status: DC | PRN

## 2020-09-11 MED ORDER — HYDRALAZINE HCL 20 MG/ML IJ SOLN: 5.0000 mg | INTRAMUSCULAR | Status: DC | PRN

## 2020-09-11 MED ORDER — MIDAZOLAM HCL 5 MG/5ML IJ SOLN
INTRAMUSCULAR | Status: AC
  Filled 2020-09-11: qty 5

## 2020-09-11 MED ORDER — EZETIMIBE 10 MG PO TABS: 10.0000 mg | ORAL_TABLET | Freq: Every evening | ORAL | Status: DC

## 2020-09-11 MED ORDER — HEPARIN (PORCINE) IN NACL 1000-0.9 UT/500ML-% IV SOLN
INTRAVENOUS | Status: AC
  Filled 2020-09-11: qty 1000

## 2020-09-11 MED ORDER — MIDAZOLAM HCL 2 MG/2ML IJ SOLN
INTRAMUSCULAR | Status: DC | PRN
  Administered 2020-09-11: 1 mg via INTRAVENOUS

## 2020-09-11 MED ORDER — CILOSTAZOL 50 MG PO TABS: 50.0000 mg | ORAL_TABLET | Freq: Two times a day (BID) | ORAL | Status: DC

## 2020-09-11 MED ORDER — ACETAMINOPHEN 325 MG PO TABS: 650.0000 mg | ORAL_TABLET | ORAL | Status: DC | PRN

## 2020-09-11 MED ORDER — MORPHINE SULFATE (PF) 2 MG/ML IV SOLN: 2.0000 mg | INTRAVENOUS | Status: DC | PRN

## 2020-09-11 MED ORDER — SODIUM CHLORIDE 0.9 % WEIGHT BASED INFUSION: 1.0000 mL/kg/h | INTRAVENOUS | Status: DC

## 2020-09-11 SURGICAL SUPPLY — 13 items
CATH ANGIO 5F PIGTAIL 65CM (CATHETERS) ×1 IMPLANT
CATH CROSS OVER TEMPO 5F (CATHETERS) ×1 IMPLANT
CATH STRAIGHT 5FR 65CM (CATHETERS) ×1 IMPLANT
CLOSURE MYNX CONTROL 5F (Vascular Products) ×1 IMPLANT
KIT PV (KITS) ×2 IMPLANT
SHEATH PINNACLE 5F 10CM (SHEATH) ×1 IMPLANT
SHEATH PROBE COVER 6X72 (BAG) ×1 IMPLANT
STOPCOCK MORSE 400PSI 3WAY (MISCELLANEOUS) ×1 IMPLANT
SYR MEDRAD MARK 7 150ML (SYRINGE) ×2 IMPLANT
TRANSDUCER W/STOPCOCK (MISCELLANEOUS) ×2 IMPLANT
TRAY PV CATH (CUSTOM PROCEDURE TRAY) ×2 IMPLANT
TUBING CIL FLEX 10 FLL-RA (TUBING) ×1 IMPLANT
WIRE HITORQ VERSACORE ST 145CM (WIRE) ×1 IMPLANT

## 2020-09-11 NOTE — Interval H&P Note (Signed)
History and Physical Interval Note:  09/11/2020 7:29 AM  [name]  has presented today for surgery, with the diagnosis of PAD.  The various methods of treatment have been discussed with the patient and family. After consideration of risks, benefits and other options for treatment, the patient has consented to  Procedure(s): ABDOMINAL AORTOGRAM W/LOWER EXTREMITY ([name]) as a surgical intervention.  The patient's history has been reviewed, patient examined, no change in status, stable for surgery.  I have reviewed the patient's chart and labs.  Questions were answered to the patient's satisfaction.     Quay Burow

## 2020-09-11 NOTE — Progress Notes (Signed)
Discharge instructions reviewed with pt and his wife (via telephone) both voice understanding.  

## 2020-09-11 NOTE — Discharge Instructions (Signed)
Femoral Site Care  This sheet gives you information about how to care for yourself after your procedure. Your health care provider may also give you more specific instructions. If you have problems or questions, contact your health care provider. What can I expect after the procedure? After the procedure, it is common to have:  Bruising that usually fades within 1-2 weeks.  Tenderness at the site. Follow these instructions at home: Wound care  Follow instructions from your health care provider about how to take care of your insertion site. Make sure you: ? Wash your hands with soap and water before you change your bandage (dressing). If soap and water are not available, use hand sanitizer. ? Change your dressing as told by your health care provider. ? Leave stitches (sutures), skin glue, or adhesive strips in place. These skin closures may need to stay in place for 2 weeks or longer. If adhesive strip edges start to loosen and curl up, you may trim the loose edges. Do not remove adhesive strips completely unless your health care provider tells you to do that.  Do not take baths, swim, or use a hot tub until your health care provider approves.  You may shower 24-48 hours after the procedure or as told by your health care provider. ? Gently wash the site with plain soap and water. ? Pat the area dry with a clean towel. ? Do not rub the site. This may cause bleeding.  Do not apply powder or lotion to the site. Keep the site clean and dry.  Check your femoral site every day for signs of infection. Check for: ? Redness, swelling, or pain. ? Fluid or blood. ? Warmth. ? Pus or a bad smell. Activity  For the first 2-3 days after your procedure, or as long as directed: ? Avoid climbing stairs as much as possible. ? Do not squat.  Do not lift anything that is heavier than 10 lb (4.5 kg), or the limit that you are told, until your health care provider says that it is safe.  Rest as  directed. ? Avoid sitting for a long time without moving. Get up to take short walks every 1-2 hours.  Do not drive for 24 hours if you were given a medicine to help you relax (sedative). General instructions  Take over-the-counter and prescription medicines only as told by your health care provider.  Keep all follow-up visits as told by your health care provider. This is important. Contact a health care provider if you have:  A fever or chills.  You have redness, swelling, or pain around your insertion site. Get help right away if:  The catheter insertion area swells very fast.  You pass out.  You suddenly start to sweat or your skin gets clammy.  The catheter insertion area is bleeding, and the bleeding does not stop when you hold steady pressure on the area.  The area near or just beyond the catheter insertion site becomes pale, cool, tingly, or numb. These symptoms may represent a serious problem that is an emergency. Do not wait to see if the symptoms will go away. Get medical help right away. Call your local emergency services (911 in the [place] Do not drive yourself to the hospital. Summary  After the procedure, it is common to have bruising that usually fades within 1-2 weeks.  Check your femoral site every day for signs of infection.  Do not lift anything that is heavier than 10 lb (4.5 kg), or   the limit that you are told, until your health care provider says that it is safe. This information is not intended to replace advice given to you by your health care provider. Make sure you discuss any questions you have with your health care provider. Document Revised: 02/25/2020 Document Reviewed: 02/25/2020 Elsevier Patient Education  2021 [employer]  

## 2020-09-12 DIAGNOSIS — I739 Peripheral vascular disease, unspecified: Secondary | ICD-10-CM

## 2020-09-19 ENCOUNTER — Encounter (HOSPITAL_COMMUNITY): Payer: PPO

## 2020-09-19 ENCOUNTER — Other Ambulatory Visit (HOSPITAL_COMMUNITY): Payer: Self-pay | Admitting: Vascular Surgery

## 2020-09-19 ENCOUNTER — Encounter: Payer: Self-pay | Admitting: Vascular Surgery

## 2020-09-19 ENCOUNTER — Ambulatory Visit (HOSPITAL_COMMUNITY)
Admission: RE | Admit: 2020-09-19 | Discharge: 2020-09-19 | Disposition: A | Discharge status: Home / Self Care | Payer: PPO | Source: Ambulatory Visit | Attending: Vascular Surgery | Admitting: Vascular Surgery

## 2020-09-19 ENCOUNTER — Other Ambulatory Visit: Payer: Self-pay

## 2020-09-19 ENCOUNTER — Ambulatory Visit: Payer: PPO | Admitting: Vascular Surgery

## 2020-09-19 VITALS — BP 131/69 | HR 89 | Temp 97.7°F | Resp 16 | Ht 72.0 in | Wt 161.9 lb

## 2020-09-19 DIAGNOSIS — I739 Peripheral vascular disease, unspecified: Secondary | ICD-10-CM

## 2020-09-19 DIAGNOSIS — I70211 Atherosclerosis of native arteries of extremities with intermittent claudication, right leg: Secondary | ICD-10-CM | POA: Diagnosis not present

## 2020-09-19 MED ORDER — RIVAROXABAN 2.5 MG PO TABS: 2.5000 mg | ORAL_TABLET | Freq: Two times a day (BID) | ORAL | 3 refills | Status: DC

## 2020-09-19 NOTE — Progress Notes (Signed)
[facility] [place]  ASSESSMENT / PLAN: [name] is a 70 y.o. 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  Patient counseled he has a 1-2% risk of developing chronic limb threatening ischemia, but a 15-30% risk of mortality in the next 5 years. Intervention should only be considered for medically optimized patients with disabling symptoms. He certainly falls into this category. Unfortunately, his reconstruction options are limited. I cannot simply restore inflow to the leg as I do not see a focal profunda lesion which could be addressed. He fortunately does have good quality saphenous vein in bilateral lower extremities.   Recommend the following which can slow the progression of atherosclerosis and reduce the risk of major adverse cardiac / limb events:  Complete cessation from all tobacco products. Blood glucose control with goal A1c < 7%. Blood pressure control with goal blood pressure < 140/90 mmHg. Lipid reduction therapy with goal LDL-C <100 mg/dL (<70 if symptomatic from PAD).  Adequate hydration (at least 2 liters / day) if patient's heart and kidney function is adequate. Aspirin 81mg  PO QD.  I added Rivaroxaban 2.5mg  PO BID today. Cilostozal 100mg  PO BID for intermittent claudication without evidence of heart failure. Daily walking to and past the point of discomfort. Patient counseled to keep a log of exercise distance. Atorvastatin 40-80mg  PO QD (or other "high intensity" statin therapy). The addition of ezetimibe or PCSK9 inhibitors may benefit patients with difficult to control hypercholesterolemia.  Return to care in one month. Sooner if symptoms progress. His only option for vascular reconstruction is a right common femoral - mid/distal peroneal bypass. Fortunately his vein appears of adequate caliber to do an autogenous bypass. If we decide to proceed with surgery I will also try to  endarterectomize the profunda femoris artery.   CHIEF COMPLAINT: right calf discomfort.  HISTORY OF PRESENT ILLNESS: [name] is a 70 y.o. male referred to the clinic for evaluation of disabling calf claudication.  He has been under the care of Dr. Alvester Chou for the same his only risk factor is hyperlipidemia.  He is a non-smoker.  He is quite active and exercises in the gym regularly.  He is highly functional and motivated.  Compliant with best medical therapy for peripheral arterial disease, including high intensity statin therapy, aspirin, Pletal.  VASCULAR RISK FACTORS: Patient reports: - No history of cerebrovascular disease / stroke / transient ischemic attack. - No history of coronary artery disease.  - No history of diabetes mellitus. - No history of smoking.  - No history of hypertension.  - No history of chronic kidney disease  - No history of chronic obstructive pulmonary disease.  Past Medical History:  Diagnosis Date  . Hyperlipidemia   . PAD (peripheral artery disease) ([place])     Past Surgical History:  Procedure Laterality Date  . ABDOMINAL AORTOGRAM W/LOWER EXTREMITY [name] 09/11/2020   Procedure: ABDOMINAL AORTOGRAM W/LOWER EXTREMITY;  Surgeon: [name], MD;  Location: [facility];  Service: Cardiovascular;  Laterality: [name];  . TONSILLECTOMY AND ADENOIDECTOMY      No family history on file.  Social History   Socioeconomic History  . Marital status: Married    Spouse name: [name]  . Number of children: [name]  . Years of education: [name]  . Highest education level: [name]  Occupational History  . [name]  Tobacco Use  . Smoking status: Never Smoker  .  Smokeless tobacco: Never Used  Vaping Use  . Vaping Use: Never used  Substance and Sexual Activity  . Alcohol use: Yes    Alcohol/week: 0.0 standard drinks  . Drug use: Never  . Sexual activity: [name]  Other Topics Concern  . [name]  Social History  Narrative  . [name]   Social Determinants of Health   Financial Resource Strain: [name]  Food Insecurity: [name]  Transportation Needs: [name]  Physical Activity: [name]  Stress: [name]  Social Connections: [name]  Intimate Partner Violence: [name]    Allergies  Allergen Reactions  . Penicillins     Current Outpatient Medications  Medication Sig Dispense Refill  . aspirin EC 81 MG tablet Take 81 mg by mouth daily. Swallow whole.    . cilostazol (PLETAL) 50 MG tablet Take 1 tablet (50 mg total) by mouth 2 (two) times daily. 60 tablet 6  . ezetimibe (ZETIA) 10 MG tablet Take 1 tablet (10 mg total) by mouth daily. (Patient taking differently: Take 10 mg by mouth every evening.) 90 tablet 1  . rosuvastatin (CRESTOR) 40 MG tablet Take 1 tablet (40 mg total) by mouth every evening. 90 tablet 1   Current Facility-Administered Medications  Medication Dose Route Frequency Provider Last Rate Last Admin  . sodium chloride flush (NS) 0.9 % injection 3 mL  3 mL Intravenous Q12H [name], MD        REVIEW OF SYSTEMS:  [X]  denotes positive finding, [ ]  denotes negative finding Cardiac  Comments:  Chest pain or chest pressure:    Shortness of breath upon exertion:    Short of breath when lying flat:    Irregular heart rhythm:        Vascular    Pain in calf, thigh, or hip brought on by ambulation: x   Pain in feet at night that wakes you up from your sleep:     Blood clot in your veins:    Leg swelling:         Pulmonary    Oxygen at home:    Productive cough:     Wheezing:         Neurologic    Sudden weakness in arms or legs:     Sudden numbness in arms or legs:     Sudden onset of difficulty speaking or slurred speech:    Temporary loss of vision in one eye:     Problems with dizziness:         Gastrointestinal    Blood in stool:     Vomited blood:         Genitourinary    Burning when urinating:     Blood in urine:         Psychiatric    Major depression:         Hematologic    Bleeding problems:    Problems with blood clotting too easily:        Skin    Rashes or ulcers:        Constitutional    Fever or chills:      PHYSICAL EXAM  Vitals:   09/19/20 1323  BP: 131/69  Pulse: 89  Resp: 16  Temp: 97.7 F (36.5 C)  TempSrc: Temporal  SpO2: 98%  Weight: 161 lb 14.4 oz (73.4 kg)  Height: 6' (1.829 m)    Constitutional: well  appearing. no distress. Appears well nourished.  Neurologic: CN intact. no focal findings. no sensory loss. Psychiatric: Mood and affect symmetric and appropriate. Eyes: No icterus. No conjunctival pallor. Ears, nose, throat: mucous membranes moist. Midline trachea.  Cardiac: regular rate and rhythm.  Respiratory: unlabored. Abdominal: soft, non-tender, non-distended.  Peripheral vascular:  2+ femoral pulses bilaterally  Absent R pedal pulses  2+ L PT pulse  Normal capillary refill in RLE Extremity: No edema. No cyanosis. No pallor.  Skin: No gangrene. No ulceration.  Lymphatic: No Stemmer's sign. No palpable lymphadenopathy.  PERTINENT LABORATORY AND RADIOLOGIC DATA  Most recent CBC CBC Latest Ref Rng & Units 08/18/2020  WBC 3.4 - 10.8 x10E3/uL 9.8  Hemoglobin 13.0 - 17.7 g/dL 15.8  Hematocrit 37.5 - 51.0 % 45.6  Platelets 150 - 450 x10E3/uL 295     Most recent CMP CMP Latest Ref Rng & Units 08/18/2020 08/01/2020  Glucose 65 - 99 mg/dL 108(H) -  BUN 8 - 27 mg/dL 15 -  Creatinine 0.76 - 1.27 mg/dL 1.00 -  Sodium 134 - 144 mmol/L 139 -  Potassium 3.5 - 5.2 mmol/L 5.1 -  Chloride 96 - 106 mmol/L 100 -  CO2 20 - 29 mmol/L 23 -  Calcium 8.6 - 10.2 mg/dL 9.5 -  Total Protein 6.0 - 8.5 g/dL - 6.2  Total Bilirubin 0.0 - 1.2 mg/dL - 0.5  Alkaline Phos 44 - 121 IU/L - 109  AST 0 - 40 IU/L - 25  ALT 0 - 44 IU/L - 25    Renal function CrCl cannot be calculated (Patient's most recent lab result is older than the maximum 21 days allowed.).  No results  found for: HGBA1C  LDL Chol Calc (NIH)  Date Value Ref Range Status  08/01/2020 111 (H) 0 - 99 mg/dL Final     Vein mapping    [name]. Stanford Breed, MD [facility] Phone Number: [phone] 09/19/2020 5:06 PM

## 2020-09-22 ENCOUNTER — Encounter (HOSPITAL_COMMUNITY): Payer: PPO

## 2020-09-22 ENCOUNTER — Other Ambulatory Visit: Payer: Self-pay

## 2020-09-26 ENCOUNTER — Ambulatory Visit: Payer: PPO | Admitting: Cardiovascular Disease

## 2020-09-26 ENCOUNTER — Other Ambulatory Visit: Payer: Self-pay

## 2020-09-26 ENCOUNTER — Encounter: Payer: Self-pay | Admitting: Cardiovascular Disease

## 2020-09-26 DIAGNOSIS — I739 Peripheral vascular disease, unspecified: Secondary | ICD-10-CM

## 2020-09-26 DIAGNOSIS — E782 Mixed hyperlipidemia: Secondary | ICD-10-CM | POA: Diagnosis not present

## 2020-09-26 DIAGNOSIS — R931 Abnormal findings on diagnostic imaging of heart and coronary circulation: Secondary | ICD-10-CM | POA: Diagnosis not present

## 2020-09-26 NOTE — Patient Instructions (Signed)

## 2020-09-26 NOTE — Assessment & Plan Note (Signed)
History of right lower extremity claudication with angiography performed by myself 09/11/2020 revealing extensive occlusive disease in the right common femoral, SFA, popliteal and tibial vessels not amenable to percutaneous or surgical revascularization.  He is on Pletal which is afforded him some relief.  I agree with Dr. Stanford Breed , vascular surgeon who suggested walking through the claudication to help improve collateral circulation.

## 2020-09-26 NOTE — Assessment & Plan Note (Signed)
Coronary calcium score of 43 with scattered calcification.  The patient is totally asymptomatic.  We will try to get his LDL as low as possible, less than 70.  This point, there is no indication to perform functional testing or any other invasive test.

## 2020-09-26 NOTE — Assessment & Plan Note (Signed)
History of hyperlipidemia on high-dose rosuvastatin and Zetia followed by our Pharm.D.  His last lipid profile performed 08/01/2020 revealed total cholesterol of 179, LDL of 111 and HDL 52.  His goal is an LDL of less than 70 for secondary prevention.  If he does not reach this on standard oral pharmacologic therapy he may be a candidate for a PCSK9.

## 2020-09-26 NOTE — Progress Notes (Signed)
09/26/2020 [name]   Feb 22, 1951  [id number]  Primary Physician [name], DO Primary Cardiologist: [name] MD [name], [place]  HPI:  [name] is a 70 y.o.    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 He was referred by Dr. Nyoka Cowden, his PCP, for evaluation and treatment of claudication.I last saw him in the office  08/18/2020.His only risk factors are of treated hyperlipidemia. He does not smoke. There is no family history of heart disease. Is never had a heart attack or stroke. He denies chest pain or shortness of breath. He is very active and exercises on a daily basis at a gym with a trainer. He also uses free weights. He developed right calf claudication approxi-1 month ago which has been lifestyle limiting. Recent Dopplers performed 06/10/2019 revealed a right ABI of0.58 with an occluded right popliteal artery.  I did begin him on Pletal 50 mg p.o. twice daily. He continues to walk and his claudication is all but resolved. His PCP did begin him on a higher dose of atorvastatin (10 mg initially up to 40 mg). I also performed a coronary calcium score which was 43 with mild scattered coronary calcifications. He denies chest pain or shortness of breath.  I did begin him on Pletal this afforded him considerable relief from his claudication.  He is fairly active and works out on an elliptical as well as multiple machines and walks outside without limitation. He denies chest pain or shortness of breath. His PCP just increase his atorvastatin from 40 to 80 mg a day and we will recheck with a goal of less than 70 given his elevated coronary calcium score.  Repeat of his cholesterol performed 08/01/2020 revealed total cholesterol 179, LDL 111 and HDL 52 on atorvastatin 80 mg.  He has appointment to see Dr. Debara Pickett most likely will begin a PCSK9 inhibitor.  He had  noticed progressive claudication of his right calf.  He is now symptomatic.  Dopplers performed 07/27/2020 revealed extension of his disease to more proximal level now with what appears to be occlusion of his right distal common femoral, SFA popliteal and tibial vessels.    I performed peripheral angiography on him 09/11/2020 revealing occluded distal right common femoral, profunda femoris, SFA, popliteal and tibial vessels.  I did not think he was a candidate for either percutaneous or surgical approach to revascularization because of poor targets and extensive degree of obstructive disease.  He did see Dr. Stanford Breed , vascular surgeon who suggested endarterectomy of the profunda femoris.  Current Meds  Medication Sig  . aspirin EC 81 MG tablet Take 81 mg by mouth daily. Swallow whole.  . cilostazol (PLETAL) 50 MG tablet Take 1 tablet (50 mg total) by mouth 2 (two) times daily.  Marland Kitchen ezetimibe (ZETIA) 10 MG tablet Take 1 tablet (10 mg total) by mouth daily. (Patient taking differently: Take 10 mg by mouth every evening.)  . rivaroxaban (XARELTO) 2.5 MG TABS tablet Take 1 tablet (2.5 mg total) by mouth 2 (two) times daily.  . rosuvastatin (CRESTOR) 40 MG tablet Take 1 tablet (40 mg total) by mouth every evening.   Current Facility-Administered Medications for the 09/26/20 encounter (Office Visit) with [name], MD  Medication  . sodium chloride flush (NS) 0.9 % injection 3 mL     Allergies  Allergen Reactions  . Penicillins  Social History   Socioeconomic History  . Marital status: Married    Spouse name: [name]  . Number of children: [name]  . Years of education: [name]  . Highest education level: [name]  Occupational History  . [name]  Tobacco Use  . Smoking status: Never Smoker  . Smokeless tobacco: Never Used  Vaping Use  . Vaping Use: Never used  Substance and Sexual Activity  . Alcohol use: Yes    Alcohol/week: 0.0 standard drinks  . Drug use:  Never  . Sexual activity: [name]  Other Topics Concern  . [name]  Social History Narrative  . [name]   Social Determinants of Health   Financial Resource Strain: [name]  Food Insecurity: [name]  Transportation Needs: [name]  Physical Activity: [name]  Stress: [name]  Social Connections: [name]  Intimate Partner Violence: [name]     Review of Systems: General: negative for chills, fever, night sweats or weight changes.  Cardiovascular: negative for chest pain, dyspnea on exertion, edema, orthopnea, palpitations, paroxysmal nocturnal dyspnea or shortness of breath Dermatological: negative for rash Respiratory: negative for cough or wheezing Urologic: negative for hematuria Abdominal: negative for nausea, vomiting, diarrhea, bright red blood per rectum, melena, or hematemesis Neurologic: negative for visual changes, syncope, or dizziness All other systems reviewed and are otherwise negative except as noted above.    Blood pressure 122/80, pulse 82, height 6' (1.829 m), weight 165 lb 12.8 oz (75.2 kg), SpO2 95 %.  General appearance: alert and no distress Neck: no adenopathy, no carotid bruit, no JVD, supple, symmetrical, trachea midline and thyroid not enlarged, symmetric, no tenderness/mass/nodules Lungs: clear to auscultation bilaterally Heart: regular rate and rhythm, S1, S2 normal, no murmur, click, rub or gallop Extremities: extremities normal, atraumatic, no cyanosis or edema Pulses: 2+ and symmetric Absent right pedal pulse Skin: Skin color, texture, turgor normal. No rashes or lesions Neurologic: Alert and oriented X 3, normal strength and tone. Normal symmetric reflexes. Normal coordination and gait  EKG not performed today  ASSESSMENT AND PLAN:   Hyperlipidemia History of hyperlipidemia on high-dose rosuvastatin and Zetia followed by our Pharm.D.  His last lipid profile performed 08/01/2020 revealed total  cholesterol of 179, LDL of 111 and HDL 52.  His goal is an LDL of less than 70 for secondary prevention.  If he does not reach this on standard oral pharmacologic therapy he may be a candidate for a PCSK9.  Claudication in peripheral vascular disease ([place]) History of right lower extremity claudication with angiography performed by myself 09/11/2020 revealing extensive occlusive disease in the right common femoral, SFA, popliteal and tibial vessels not amenable to percutaneous or surgical revascularization.  He is on Pletal which is afforded him some relief.  I agree with Dr. Stanford Breed , vascular surgeon who suggested walking through the claudication to help improve collateral circulation.  Elevated coronary artery calcium score Coronary calcium score of 43 with scattered calcification.  The patient is totally asymptomatic.  We will try to get his LDL as low as possible, less than 70.  This point, there is no indication to perform functional testing or any other invasive test.      [name] MD [facility], [facility] 09/26/2020 9:23 AM

## 2020-09-29 ENCOUNTER — Ambulatory Visit: Payer: PPO | Admitting: Cardiovascular Disease

## 2020-09-29 DIAGNOSIS — I739 Peripheral vascular disease, unspecified: Secondary | ICD-10-CM

## 2020-10-02 ENCOUNTER — Telehealth: Payer: Self-pay

## 2020-10-02 NOTE — Telephone Encounter (Signed)
Referral placed.

## 2020-10-02 NOTE — Telephone Encounter (Signed)
Patient called to report he has developed some pain, swelling and discoloration in his right foot that comes and goes. He says the discomfort is worse at night and the pain is in his toes. He has a follow up with TH in a month after recent aogram w/Berry - moved up to tomorrow.

## 2020-10-03 ENCOUNTER — Encounter: Payer: Self-pay | Admitting: Vascular Surgery

## 2020-10-03 ENCOUNTER — Other Ambulatory Visit: Payer: Self-pay

## 2020-10-03 ENCOUNTER — Ambulatory Visit: Payer: PPO | Admitting: Vascular Surgery

## 2020-10-03 VITALS — BP 128/78 | HR 82 | Temp 97.9°F | Resp 20 | Ht 72.0 in | Wt 161.0 lb

## 2020-10-03 DIAGNOSIS — I70211 Atherosclerosis of native arteries of extremities with intermittent claudication, right leg: Secondary | ICD-10-CM | POA: Diagnosis not present

## 2020-10-03 NOTE — H&P (View-Only) (Signed)
VASC[facility] AND VEIN SPECIALISTS OF [place]  ASSESSMENT / PLAN: [name] is a 70 y.o. 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 I am worried he has impending ischemic ulceration of the right great toe.  Patient counseled he has a 1-2% risk of developing chronic limb threatening ischemia, but a 15-30% risk of mortality in the next 5 years. Intervention should only be considered for medically optimized patients with disabling symptoms. He certainly falls into this category. Unfortunately, his reconstruction options are limited. I cannot simply restore inflow to the leg as I do not see a focal profunda lesion which could be addressed. He fortunately does have good quality saphenous vein in bilateral lower extremities.   Recommend the following which can slow the progression of atherosclerosis and reduce the risk of major adverse cardiac / limb events:  Complete cessation from all tobacco products. Blood glucose control with goal A1c < 7%. Blood pressure control with goal blood pressure < 140/90 mmHg. Lipid reduction therapy with goal LDL-C <100 mg/dL (<70 if symptomatic from PAD).  Adequate hydration (at least 2 liters / day) if patient's heart and kidney function is adequate. Aspirin 81mg  PO QD.  Rivaroxaban 2.5mg  PO BID Cilostozal 100mg  PO BID for intermittent claudication without evidence of heart failure. Daily walking to and past the point of discomfort. Patient counseled to keep a log of exercise distance. Atorvastatin 40-80mg  PO QD (or other "high intensity" statin therapy). The addition of ezetimibe or PCSK9 inhibitors may benefit patients with difficult to control hypercholesterolemia.  Return to care in two weeks. I instructed him that I am highly concerned about his small toe lesion. I recommended a right common femoral - mid/distal peroneal bypass. He would like to try a period of watchful waiting. I will see him again  in 2 weeks with his wife.   CHIEF COMPLAINT: right calf discomfort.  HISTORY OF PRESENT ILLNESS: Previously: [name] is a 70 y.o. male referred to the clinic for evaluation of disabling calf claudication.  He has been under the care of Dr. Alvester Chou for the same his only risk factor is hyperlipidemia.  He is a non-smoker.  He is quite active and exercises in the gym regularly.  He is highly functional and motivated.  Compliant with best medical therapy for peripheral arterial disease, including high intensity statin therapy, aspirin, Pletal.  VASCULAR RISK FACTORS: Patient reports: - No history of cerebrovascular disease / stroke / transient ischemic attack. - No history of coronary artery disease.  - No history of diabetes mellitus. - No history of smoking.  - No history of hypertension.  - No history of chronic kidney disease  - No history of chronic obstructive pulmonary disease.  INTERVAL HISTORY: 10/03/20: patient returns for one month follow up. No real change in claudication symptoms. He is able to exercise with some difficulty. After about 7 minutes on the exercise bike, he starts to develop pain in his calf. This will usually improve by 30 minutes. He reports he has worn a new shoe and has a blister on his great toe. This is associated with pain. The foot and ankle is red. He has noted the leg is chaning color more often. He does not report rest pain, per se. He has pain in the leg at rest, especially at night, but states it is episodic.   Past Medical History:  Diagnosis Date  . Hyperlipidemia   . PAD (peripheral artery disease) ([place])  Past Surgical History:  Procedure Laterality Date  . ABDOMINAL AORTOGRAM W/LOWER EXTREMITY [name] 09/11/2020   Procedure: ABDOMINAL AORTOGRAM W/LOWER EXTREMITY;  Surgeon: [name], MD;  Location: [place] [facility];  Service: Cardiovascular;  Laterality: [name];  . TONSILLECTOMY AND ADENOIDECTOMY      History reviewed. No pertinent  family history.  Social History   Socioeconomic History  . Marital status: Married    Spouse name: [name]  . Number of children: [name]  . Years of education: [name]  . Highest education level: [name]  Occupational History  . [name]  Tobacco Use  . Smoking status: Never Smoker  . Smokeless tobacco: Never Used  Vaping Use  . Vaping Use: Never used  Substance and Sexual Activity  . Alcohol use: Yes    Alcohol/week: 0.0 standard drinks  . Drug use: Never  . Sexual activity: [name]  Other Topics Concern  . [name]  Social History Narrative  . [name]   Social Determinants of Health   Financial Resource Strain: [name]  Food Insecurity: [name]  Transportation Needs: [name]  Physical Activity: [name]  Stress: [name]  Social Connections: [name]  Intimate Partner Violence: [name]    Allergies  Allergen Reactions  . Penicillins     Current Outpatient Medications  Medication Sig Dispense Refill  . aspirin EC 81 MG tablet Take 81 mg by mouth daily. Swallow whole.    . cilostazol (PLETAL) 50 MG tablet Take 1 tablet (50 mg total) by mouth 2 (two) times daily. 60 tablet 6  . ezetimibe (ZETIA) 10 MG tablet Take 1 tablet (10 mg total) by mouth daily. (Patient taking differently: Take 10 mg by mouth every evening.) 90 tablet 1  . rivaroxaban (XARELTO) 2.5 MG TABS tablet Take 1 tablet (2.5 mg total) by mouth 2 (two) times daily. 60 tablet 3  . rosuvastatin (CRESTOR) 40 MG tablet Take 1 tablet (40 mg total) by mouth every evening. 90 tablet 1   Current Facility-Administered Medications  Medication Dose Route Frequency Provider Last Rate Last Admin  . sodium chloride flush (NS) 0.9 % injection 3 mL  3 mL Intravenous Q12H [name], MD        REVIEW OF SYSTEMS:  [X]  denotes positive finding, [ ]  denotes negative finding Cardiac  Comments:  Chest pain or chest pressure:    Shortness of breath upon  exertion:    Short of breath when lying flat:    Irregular heart rhythm:        Vascular    Pain in calf, thigh, or hip brought on by ambulation: x   Pain in feet at night that wakes you up from your sleep:     Blood clot in your veins:    Leg swelling:         Pulmonary    Oxygen at home:    Productive cough:     Wheezing:         Neurologic    Sudden weakness in arms or legs:     Sudden numbness in arms or legs:     Sudden onset of difficulty speaking or slurred speech:    Temporary loss of vision in one eye:     Problems with dizziness:         Gastrointestinal    Blood in stool:  Vomited blood:         Genitourinary    Burning when urinating:     Blood in urine:        Psychiatric    Major depression:         Hematologic    Bleeding problems:    Problems with blood clotting too easily:        Skin    Rashes or ulcers:        Constitutional    Fever or chills:      PHYSICAL EXAM  Vitals:   10/03/20 0816  BP: 128/78  Pulse: 82  Resp: 20  Temp: 97.9 F (36.6 C)  SpO2: 100%  Weight: 161 lb (73 kg)  Height: 6' (1.829 m)    Constitutional: well appearing. no distress. Appears well nourished.  Neurologic: CN intact. no focal findings. no sensory loss. Psychiatric: Mood and affect symmetric and appropriate. Eyes: No icterus. No conjunctival pallor. Ears, nose, throat: mucous membranes moist. Midline trachea.  Cardiac: regular rate and rhythm.  Respiratory: unlabored. Abdominal: soft, non-tender, non-distended.  Peripheral vascular:  2+ femoral pulses bilaterally  Absent R pedal pulses  2+ L PT pulse  Normal capillary refill in RLE Extremity: No edema. No cyanosis. No pallor.  Skin: No gangrene. No ulceration.  Lymphatic: No Stemmer's sign. No palpable lymphadenopathy.      PERTINENT LABORATORY AND RADIOLOGIC DATA  Most recent CBC CBC Latest Ref Rng & Units 08/18/2020  WBC 3.4 - 10.8 x10E3/uL 9.8  Hemoglobin 13.0 - 17.7 g/dL 15.8   Hematocrit 37.5 - 51.0 % 45.6  Platelets 150 - 450 x10E3/uL 295     Most recent CMP CMP Latest Ref Rng & Units 08/18/2020 08/01/2020  Glucose 65 - 99 mg/dL 108(H) -  BUN 8 - 27 mg/dL 15 -  Creatinine 0.76 - 1.27 mg/dL 1.00 -  Sodium 134 - 144 mmol/L 139 -  Potassium 3.5 - 5.2 mmol/L 5.1 -  Chloride 96 - 106 mmol/L 100 -  CO2 20 - 29 mmol/L 23 -  Calcium 8.6 - 10.2 mg/dL 9.5 -  Total Protein 6.0 - 8.5 g/dL - 6.2  Total Bilirubin 0.0 - 1.2 mg/dL - 0.5  Alkaline Phos 44 - 121 IU/L - 109  AST 0 - 40 IU/L - 25  ALT 0 - 44 IU/L - 25    Renal function CrCl cannot be calculated (Patient's most recent lab result is older than the maximum 21 days allowed.).  No results found for: HGBA1C  LDL Chol Calc (NIH)  Date Value Ref Range Status  08/01/2020 111 (H) 0 - 99 mg/dL Final     Vein mapping    [name]. Stanford Breed, MD [facility] of [facility] Phone Number: [phone] 10/03/2020 9:31 AM

## 2020-10-03 NOTE — Progress Notes (Signed)
VASCULAR AND VEIN SPECIALISTS OF [place]  ASSESSMENT / PLAN: [name] is a 70 y.o. 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 I am worried he has impending ischemic ulceration of the right great toe.  Patient counseled he has a 1-2% risk of developing chronic limb threatening ischemia, but a 15-30% risk of mortality in the next 5 years. Intervention should only be considered for medically optimized patients with disabling symptoms. He certainly falls into this category. Unfortunately, his reconstruction options are limited. I cannot simply restore inflow to the leg as I do not see a focal profunda lesion which could be addressed. He fortunately does have good quality saphenous vein in bilateral lower extremities.   Recommend the following which can slow the progression of atherosclerosis and reduce the risk of major adverse cardiac / limb events:  Complete cessation from all tobacco products. Blood glucose control with goal A1c < 7%. Blood pressure control with goal blood pressure < 140/90 mmHg. Lipid reduction therapy with goal LDL-C <100 mg/dL (<70 if symptomatic from PAD).  Adequate hydration (at least 2 liters / day) if patient's Jackson and kidney function is adequate. Aspirin 81mg  PO QD.  Rivaroxaban 2.5mg  PO BID Cilostozal 100mg  PO BID for intermittent claudication without evidence of Jackson failure. Daily walking to and past the point of discomfort. Patient counseled to keep a log of exercise distance. Atorvastatin 40-80mg  PO QD (or other "high intensity" statin therapy). The addition of ezetimibe or PCSK9 inhibitors may benefit patients with difficult to control hypercholesterolemia.  Return to care in two weeks. I instructed him that I am highly concerned about his small toe lesion. I recommended a right common femoral - mid/distal peroneal bypass. He would like to try a period of watchful waiting. I will see him again  in 2 weeks with his wife.   CHIEF COMPLAINT: right calf discomfort.  HISTORY OF PRESENT ILLNESS: Previously: [name] is a 70 y.o. male referred to the clinic for evaluation of disabling calf claudication.  He has been under the care of Dr. Alvester Chou for the same his only risk factor is hyperlipidemia.  He is a non-smoker.  He is quite active and exercises in the gym regularly.  He is highly functional and motivated.  Compliant with best medical therapy for peripheral arterial disease, including high intensity statin therapy, aspirin, Pletal.  VASCULAR RISK FACTORS: Patient reports: - No history of cerebrovascular disease / stroke / transient ischemic attack. - No history of coronary artery disease.  - No history of diabetes mellitus. - No history of smoking.  - No history of hypertension.  - No history of chronic kidney disease  - No history of chronic obstructive pulmonary disease.  INTERVAL HISTORY: 10/03/20: patient returns for one month follow up. No real change in claudication symptoms. He is able to exercise with some difficulty. After about 7 minutes on the exercise bike, he starts to develop pain in his calf. This will usually improve by 30 minutes. He reports he has worn a new shoe and has a blister on his great toe. This is associated with pain. The foot and ankle is red. He has noted the leg is chaning color more often. He does not report rest pain, per se. He has pain in the leg at rest, especially at night, but states it is episodic.   Past Medical History:  Diagnosis Date  . Hyperlipidemia   . PAD (peripheral artery disease) ([place])  Past Surgical History:  Procedure Laterality Date  . ABDOMINAL AORTOGRAM W/LOWER EXTREMITY [name] 09/11/2020   Procedure: ABDOMINAL AORTOGRAM W/LOWER EXTREMITY;  Surgeon: [name], MD;  Location: [place] [facility];  Service: Cardiovascular;  Laterality: [name];  . TONSILLECTOMY AND ADENOIDECTOMY      History reviewed. No pertinent  family history.  Social History   Socioeconomic History  . Marital status: Married    Spouse name: [name]  . Number of children: [name]  . Years of education: [name]  . Highest education level: [name]  Occupational History  . [name]  Tobacco Use  . Smoking status: Never Smoker  . Smokeless tobacco: Never Used  Vaping Use  . Vaping Use: Never used  Substance and Sexual Activity  . Alcohol use: Yes    Alcohol/week: 0.0 standard drinks  . Drug use: Never  . Sexual activity: [name]  Other Topics Concern  . [name]  Social History Narrative  . [name]   Social Determinants of Health   Financial Resource Strain: [name]  Food Insecurity: [name]  Transportation Needs: [name]  Physical Activity: [name]  Stress: [name]  Social Connections: [name]  Intimate Partner Violence: [name]    Allergies  Allergen Reactions  . Penicillins     Current Outpatient Medications  Medication Sig Dispense Refill  . aspirin EC 81 MG tablet Take 81 mg by mouth daily. Swallow whole.    . cilostazol (PLETAL) 50 MG tablet Take 1 tablet (50 mg total) by mouth 2 (two) times daily. 60 tablet 6  . ezetimibe (ZETIA) 10 MG tablet Take 1 tablet (10 mg total) by mouth daily. (Patient taking differently: Take 10 mg by mouth every evening.) 90 tablet 1  . rivaroxaban (XARELTO) 2.5 MG TABS tablet Take 1 tablet (2.5 mg total) by mouth 2 (two) times daily. 60 tablet 3  . rosuvastatin (CRESTOR) 40 MG tablet Take 1 tablet (40 mg total) by mouth every evening. 90 tablet 1   Current Facility-Administered Medications  Medication Dose Route Frequency Provider Last Rate Last Admin  . sodium chloride flush (NS) 0.9 % injection 3 mL  3 mL Intravenous Q12H [name], MD        REVIEW OF SYSTEMS:  [X]  denotes positive finding, [ ]  denotes negative finding Cardiac  Comments:  Chest pain or chest pressure:    Shortness of breath upon  exertion:    Short of breath when lying flat:    Irregular Jackson rhythm:        Vascular    Pain in calf, thigh, or hip brought on by ambulation: x   Pain in feet at night that wakes you up from your sleep:     Blood clot in your veins:    Leg swelling:         Pulmonary    Oxygen at home:    Productive cough:     Wheezing:         Neurologic    Sudden weakness in arms or legs:     Sudden numbness in arms or legs:     Sudden onset of difficulty speaking or slurred speech:    Temporary loss of vision in one eye:     Problems with dizziness:         Gastrointestinal    Blood in stool:  Vomited blood:         Genitourinary    Burning when urinating:     Blood in urine:        Psychiatric    Major depression:         Hematologic    Bleeding problems:    Problems with blood clotting too easily:        Skin    Rashes or ulcers:        Constitutional    Fever or chills:      PHYSICAL EXAM  Vitals:   10/03/20 0816  BP: 128/78  Pulse: 82  Resp: 20  Temp: 97.9 F (36.6 C)  SpO2: 100%  Weight: 161 lb (73 kg)  Height: 6' (1.829 m)    Constitutional: well appearing. no distress. Appears well nourished.  Neurologic: CN intact. no focal findings. no sensory loss. Psychiatric: Mood and affect symmetric and appropriate. Eyes: No icterus. No conjunctival pallor. Ears, nose, throat: mucous membranes moist. Midline trachea.  Cardiac: regular rate and rhythm.  Respiratory: unlabored. Abdominal: soft, non-tender, non-distended.  Peripheral vascular:  2+ femoral pulses bilaterally  Absent R pedal pulses  2+ L PT pulse  Normal capillary refill in RLE Extremity: No edema. No cyanosis. No pallor.  Skin: No gangrene. No ulceration.  Lymphatic: No Stemmer's sign. No palpable lymphadenopathy.      PERTINENT LABORATORY AND RADIOLOGIC DATA  Most recent CBC CBC Latest Ref Rng & Units 08/18/2020  WBC 3.4 - 10.8 x10E3/uL 9.8  Hemoglobin 13.0 - 17.7 g/dL 15.8   Hematocrit 37.5 - 51.0 % 45.6  Platelets 150 - 450 x10E3/uL 295     Most recent CMP CMP Latest Ref Rng & Units 08/18/2020 08/01/2020  Glucose 65 - 99 mg/dL 108(H) -  BUN 8 - 27 mg/dL 15 -  Creatinine 0.76 - 1.27 mg/dL 1.00 -  Sodium 134 - 144 mmol/L 139 -  Potassium 3.5 - 5.2 mmol/L 5.1 -  Chloride 96 - 106 mmol/L 100 -  CO2 20 - 29 mmol/L 23 -  Calcium 8.6 - 10.2 mg/dL 9.5 -  Total Protein 6.0 - 8.5 g/dL - 6.2  Total Bilirubin 0.0 - 1.2 mg/dL - 0.5  Alkaline Phos 44 - 121 IU/L - 109  AST 0 - 40 IU/L - 25  ALT 0 - 44 IU/L - 25    Renal function CrCl cannot be calculated (Patient's most recent lab result is older than the maximum 21 days allowed.).  No results found for: HGBA1C  LDL Chol Calc (NIH)  Date Value Ref Range Status  08/01/2020 111 (H) 0 - 99 mg/dL Final     Vein mapping    [name]. Stanford Breed, MD [facility] Special[facility] of [facility] Phone Number: [phone] 10/03/2020 9:31 AM

## 2020-10-04 ENCOUNTER — Ambulatory Visit: Payer: PPO | Admitting: Family Medicine

## 2020-10-05 ENCOUNTER — Other Ambulatory Visit: Payer: Self-pay

## 2020-10-06 ENCOUNTER — Other Ambulatory Visit: Payer: Self-pay | Admitting: Physician Assistant

## 2020-10-06 DIAGNOSIS — E782 Mixed hyperlipidemia: Secondary | ICD-10-CM | POA: Diagnosis not present

## 2020-10-06 MED ORDER — OXYCODONE-ACETAMINOPHEN 10-325 MG PO TABS: 1.0000 | ORAL_TABLET | Freq: Four times a day (QID) | ORAL | 0 refills | Status: DC | PRN

## 2020-10-07 LAB — LIPID PANEL
Chol/HDL Ratio: 2.8 ratio (ref 0.0–5.0)
Cholesterol, Total: 127 mg/dL (ref 100–199)
HDL: 46 mg/dL (ref >39)
LDL Chol Calc (NIH): 66 mg/dL (ref 0–99)
Triglycerides: 72 mg/dL (ref 0–149)
VLDL Cholesterol Cal: 15 mg/dL (ref 5–40)

## 2020-10-09 NOTE — Progress Notes (Signed)
Surgical Instructions    Your procedure is scheduled on 10/13/20.  Report to [facility] Main Entrance "A" at 05:30 A.M., then check in with the Admitting office.  Call this number if you have problems the morning of surgery:  [phone]   If you have any questions prior to your surgery date call [phone]: Open Monday-Friday 8am-4pm    Remember:  Do not eat or drink after midnight the night before your surgery      Take these medicines the morning of surgery with A SIP OF WATER  cilostazol (PLETAL)   As of today, STOP taking any Aspirin (unless otherwise instructed by your surgeon) Aleve, Naproxen, Ibuprofen, Motrin, Advil, Goody's, BC's, all herbal medications, fish oil, and all vitamins. Please call your surgeon in regards to when to stop taking your rivaroxaban Alveda Reasons).                     Do not wear jewelry, make up, or nail polish            Do not wear lotions, powders, perfumes/colognes, or deodorant.            Do not shave 48 hours prior to surgery.  Men may shave face and neck.            Do not bring valuables to the hospital.            [facility] is not responsible for any belongings or valuables.  Do NOT Smoke (Tobacco/Vaping) or drink Alcohol 24 hours prior to your procedure If you use a CPAP at night, you may bring all equipment for your overnight stay.   Contacts, glasses, dentures or bridgework may not be worn into surgery, please bring cases for these belongings   For patients admitted to the hospital, discharge time will be determined by your treatment team.   Patients discharged the day of surgery will not be allowed to drive home, and someone needs to stay with them for 24 hours.    Special instructions:   [place]- Preparing For Surgery  Before surgery, you can play an important role. Because skin is not sterile, your skin needs to be as free of germs as possible. You can reduce the number of germs on your skin by washing with CHG  (chlorahexidine gluconate) Soap before surgery.  CHG is an antiseptic cleaner which kills germs and bonds with the skin to continue killing germs even after washing.    Oral Hygiene is also important to reduce your risk of infection.  Remember - BRUSH YOUR TEETH THE MORNING OF SURGERY WITH YOUR REGULAR TOOTHPASTE  Please do not use if you have an allergy to CHG or antibacterial soaps. If your skin becomes reddened/irritated stop using the CHG.  Do not shave (including legs and underarms) for at least 48 hours prior to first CHG shower. It is OK to shave your face.  Please follow these instructions carefully.   1. Shower the NIGHT BEFORE SURGERY and the MORNING OF SURGERY  2. If you chose to wash your hair, wash your hair first as usual with your normal shampoo.  3. After you shampoo, rinse your hair and body thoroughly to remove the shampoo.  4. Wash Face and genitals (private parts) with your normal soap.   5.  Shower the NIGHT BEFORE SURGERY and the MORNING OF SURGERY with CHG Soap.   6. Use CHG Soap as you would any other liquid soap. You can apply CHG directly  to the skin and wash gently with a scrungie or a clean washcloth.   7. Apply the CHG Soap to your body ONLY FROM THE NECK DOWN.  Do not use on open wounds or open sores. Avoid contact with your eyes, ears, mouth and genitals (private parts). Wash Face and genitals (private parts)  with your normal soap.   8. Wash thoroughly, paying special attention to the area where your surgery will be performed.  9. Thoroughly rinse your body with warm water from the neck down.  10. DO NOT shower/wash with your normal soap after using and rinsing off the CHG Soap.  11. Pat yourself dry with a CLEAN TOWEL.  12. Wear CLEAN PAJAMAS to bed the night before surgery  13. Place CLEAN SHEETS on your bed the night before your surgery  14. DO NOT SLEEP WITH PETS.   Day of Surgery: Take a shower with CHG soap. Wear Clean/Comfortable clothing  the morning of surgery Do not apply any deodorants/lotions.   Remember to brush your teeth WITH YOUR REGULAR TOOTHPASTE.   Please read over the following fact sheets that you were given.

## 2020-10-10 ENCOUNTER — Encounter: Payer: Self-pay | Admitting: Vascular Surgery

## 2020-10-10 ENCOUNTER — Ambulatory Visit: Payer: PPO | Admitting: Vascular Surgery

## 2020-10-10 ENCOUNTER — Encounter: Payer: PPO | Admitting: Vascular Surgery

## 2020-10-10 ENCOUNTER — Other Ambulatory Visit: Payer: Self-pay

## 2020-10-10 ENCOUNTER — Encounter (HOSPITAL_COMMUNITY): Payer: Self-pay

## 2020-10-10 ENCOUNTER — Encounter (HOSPITAL_COMMUNITY)
Admission: RE | Admit: 2020-10-10 | Discharge: 2020-10-10 | Disposition: A | Discharge status: Home / Self Care | Payer: PPO | Source: Ambulatory Visit | Attending: Vascular Surgery | Admitting: Vascular Surgery

## 2020-10-10 VITALS — BP 116/73 | HR 84 | Temp 98.4°F | Resp 20 | Ht 72.0 in | Wt 160.0 lb

## 2020-10-10 DIAGNOSIS — I70235 Atherosclerosis of native arteries of right leg with ulceration of other part of foot: Secondary | ICD-10-CM | POA: Diagnosis present

## 2020-10-10 DIAGNOSIS — I7025 Atherosclerosis of native arteries of other extremities with ulceration: Secondary | ICD-10-CM

## 2020-10-10 DIAGNOSIS — Z20822 Contact with and (suspected) exposure to covid-19: Secondary | ICD-10-CM | POA: Insufficient documentation

## 2020-10-10 DIAGNOSIS — Z01812 Encounter for preprocedural laboratory examination: Secondary | ICD-10-CM | POA: Insufficient documentation

## 2020-10-10 DIAGNOSIS — M76899 Other specified enthesopathies of unspecified lower limb, excluding foot: Secondary | ICD-10-CM | POA: Diagnosis not present

## 2020-10-10 DIAGNOSIS — I739 Peripheral vascular disease, unspecified: Secondary | ICD-10-CM | POA: Diagnosis present

## 2020-10-10 DIAGNOSIS — I70239 Atherosclerosis of native arteries of right leg with ulceration of unspecified site: Secondary | ICD-10-CM | POA: Diagnosis not present

## 2020-10-10 DIAGNOSIS — Z7901 Long term (current) use of anticoagulants: Secondary | ICD-10-CM | POA: Diagnosis not present

## 2020-10-10 DIAGNOSIS — Z7982 Long term (current) use of aspirin: Secondary | ICD-10-CM | POA: Diagnosis not present

## 2020-10-10 DIAGNOSIS — Z79899 Other long term (current) drug therapy: Secondary | ICD-10-CM | POA: Diagnosis not present

## 2020-10-10 DIAGNOSIS — E785 Hyperlipidemia, unspecified: Secondary | ICD-10-CM | POA: Diagnosis present

## 2020-10-10 DIAGNOSIS — L97519 Non-pressure chronic ulcer of other part of right foot with unspecified severity: Secondary | ICD-10-CM | POA: Diagnosis present

## 2020-10-10 DIAGNOSIS — R931 Abnormal findings on diagnostic imaging of heart and coronary circulation: Secondary | ICD-10-CM | POA: Diagnosis not present

## 2020-10-10 LAB — CBC
HCT: 45.5 % (ref 39.0–52.0)
Hemoglobin: 14.7 g/dL (ref 13.0–17.0)
MCH: 31.2 pg (ref 26.0–34.0)
MCHC: 32.3 g/dL (ref 30.0–36.0)
MCV: 96.6 fL (ref 80.0–100.0)
Platelets: 240 10*3/uL (ref 150–400)
RBC: 4.71 MIL/uL (ref 4.22–5.81)
RDW: 12.3 % (ref 11.5–15.5)
WBC: 10.5 10*3/uL (ref 4.0–10.5)
nRBC: 0 % (ref 0.0–0.2)

## 2020-10-10 LAB — URINALYSIS, ROUTINE W REFLEX MICROSCOPIC
Bacteria, UA: NONE SEEN
Bilirubin Urine: NEGATIVE
Glucose, UA: NEGATIVE mg/dL
Ketones, ur: NEGATIVE mg/dL
Leukocytes,Ua: NEGATIVE
Nitrite: NEGATIVE
Protein, ur: NEGATIVE mg/dL
Specific Gravity, Urine: 1.018 (ref 1.005–1.030)
pH: 6 (ref 5.0–8.0)

## 2020-10-10 LAB — PROTIME-INR
INR: 1.1 (ref 0.8–1.2)
Prothrombin Time: 14 seconds (ref 11.4–15.2)

## 2020-10-10 LAB — COMPREHENSIVE METABOLIC PANEL
ALT: 31 U/L (ref 0–44)
AST: 28 U/L (ref 15–41)
Albumin: 2.8 g/dL — ABNORMAL LOW (ref 3.5–5.0)
Alkaline Phosphatase: 79 U/L (ref 38–126)
Anion gap: 5 (ref 5–15)
BUN: 19 mg/dL (ref 8–23)
CO2: 31 mmol/L (ref 22–32)
Calcium: 8.8 mg/dL — ABNORMAL LOW (ref 8.9–10.3)
Chloride: 100 mmol/L (ref 98–111)
Creatinine, Ser: 0.95 mg/dL (ref 0.61–1.24)
GFR, Estimated: >60 mL/min (ref >60)
Glucose, Bld: 94 mg/dL (ref 70–99)
Potassium: 4.3 mmol/L (ref 3.5–5.1)
Sodium: 136 mmol/L (ref 135–145)
Total Bilirubin: 0.6 mg/dL (ref 0.3–1.2)
Total Protein: 6.3 g/dL — ABNORMAL LOW (ref 6.5–8.1)

## 2020-10-10 LAB — TYPE AND SCREEN
ABO/RH(D): AB POS
Antibody Screen: NEGATIVE

## 2020-10-10 LAB — SARS CORONAVIRUS 2 (TAT 6-24 HRS): SARS Coronavirus 2: NEGATIVE

## 2020-10-10 LAB — SURGICAL PCR SCREEN
MRSA, PCR: NEGATIVE
Staphylococcus aureus: NEGATIVE

## 2020-10-10 LAB — APTT: aPTT: 27 seconds (ref 24–36)

## 2020-10-10 MED ORDER — OXYCODONE-ACETAMINOPHEN 10-325 MG PO TABS: 1.0000 | ORAL_TABLET | Freq: Four times a day (QID) | ORAL | 0 refills | Status: DC | PRN

## 2020-10-10 NOTE — Progress Notes (Signed)
PCP - Dr. [name] Cardiologist - Dr. Adora Fridge  Chest x-ray - n/a EKG - 08/18/20 Stress Test - denies ECHO - denies Cardiac Cath - denies  Sleep Study - denies CPAP - denies  Blood Thinner Instructions: Pletal and Xarelto; hold 3 days prior to procedure. LD of Pletal 10/09/20 (pt ran out) and LD of Xarelto 10/10/20. Aspirin Instructions: continue  COVID TEST- 10/10/20; pt aware to quarantine after testing.    Anesthesia review:   Patient denies shortness of breath, fever, cough and chest pain at PAT appointment   All instructions explained to the patient, with a verbal understanding of the material. Patient agrees to go over the instructions while at home for a better understanding. Patient also instructed to self quarantine after being tested for COVID-19. The opportunity to ask questions was provided.

## 2020-10-10 NOTE — Progress Notes (Signed)
Mr. Grafton returns to the office to discuss surgery. Unfortunately his right great toe has deteriorated.  The ischemic changes have worsened.  His pain has transitioned into ischemic rest pain. I counseled both him and his wife extensively about femoral-tibial bypass.  I counseled him carding expected patency, expected perioperative course, need for surveillance, etc. Will plan to proceed with right common femoral to peroneal bypass with in-situ greater saphenous vein conduit on Friday 10/13/20. I refilled his percocet for ischemic rest pain.  [name]. Stanford Breed, MD [facility] Special[facility] of [facility] Phone Number: [phone] 10/10/2020 8:55 AM

## 2020-10-10 NOTE — Pre-Procedure Instructions (Signed)
Surgical Instructions    Your procedure is scheduled on Friday, April 8th.  Report to [facility] Main Entrance "A" at 5:30 A.M., then check in with the Admitting office.  Call this number if you have problems the morning of surgery:  [phone]   If you have any questions prior to your surgery date call [phone]: Open Monday-Friday 8am-4pm    Remember:  Do not eat or drink after midnight the night before your surgery    Take these medicines the morning of surgery with A SIP OF WATER  aspirin EC  oxyCODONE-acetaminophen (PERCOCET)-as needed for pain  Continue to hold cilostazol (PLETAL) or rivaroxaban (XARELTO) until after surgery.   As of today, STOP taking any Aspirin (unless otherwise instructed by your surgeon) Aleve, Naproxen, Ibuprofen, Motrin, Advil, Goody's, BC's, all herbal medications, fish oil, and all vitamins.                     Do NOT Smoke (Tobacco/Vaping) or drink Alcohol 24 hours prior to your procedure.  If you use a CPAP at night, you may bring all equipment for your overnight stay.   Contacts, glasses, piercing's, hearing aid's, dentures or partials may not be worn into surgery, please bring cases for these belongings.    For patients admitted to the hospital, discharge time will be determined by your treatment team.   Patients discharged the day of surgery will not be allowed to drive home, and someone needs to stay with them for 24 hours.    Special instructions:   [facility]- Preparing For Surgery  Before surgery, you can play an important role. Because skin is not sterile, your skin needs to be as free of germs as possible. You can reduce the number of germs on your skin by washing with CHG (chlorahexidine gluconate) Soap before surgery.  CHG is an antiseptic cleaner which kills germs and bonds with the skin to continue killing germs even after washing.    Oral Hygiene is also important to reduce your risk of infection.  Remember - BRUSH YOUR  TEETH THE MORNING OF SURGERY WITH YOUR REGULAR TOOTHPASTE  Please do not use if you have an allergy to CHG or antibacterial soaps. If your skin becomes reddened/irritated stop using the CHG.  Do not shave (including legs and underarms) for at least 48 hours prior to first CHG shower. It is OK to shave your face.  Please follow these instructions carefully.   1. Shower the NIGHT BEFORE SURGERY and the MORNING OF SURGERY  2. If you chose to wash your hair, wash your hair first as usual with your normal shampoo.  3. After you shampoo, rinse your hair and body thoroughly to remove the shampoo.  4. Use CHG Soap as you would any other liquid soap. You can apply CHG directly to the skin and wash gently with a scrungie or a clean washcloth.   5. Apply the CHG Soap to your body ONLY FROM THE NECK DOWN.  Do not use on open wounds or open sores. Avoid contact with your eyes, ears, mouth and genitals (private parts). Wash Face and genitals (private parts)  with your normal soap.   6. Wash thoroughly, paying special attention to the area where your surgery will be performed.  7. Thoroughly rinse your body with warm water from the neck down.  8. DO NOT shower/wash with your normal soap after using and rinsing off the CHG Soap.  9. Pat yourself dry with a CLEAN  TOWEL.  10. Wear CLEAN PAJAMAS to bed the night before surgery  11. Place CLEAN SHEETS on your bed the night before your surgery  12. DO NOT SLEEP WITH PETS.   Day of Surgery: Shower with CHG soap. Do not wear jewelry. Do not wear lotions, powders, colognes, or deodorant. Do not shave 48 hours prior to surgery.  Men may shave face and neck. Do not bring valuables to the hospital. [facility] is not responsible for any belongings or valuables. Wear Clean/Comfortable clothing the morning of surgery Remember to brush your teeth WITH YOUR REGULAR TOOTHPASTE.   Please read over the following fact sheets that you were given.

## 2020-10-12 ENCOUNTER — Other Ambulatory Visit (HOSPITAL_COMMUNITY): Payer: PPO

## 2020-10-12 NOTE — Anesthesia Preprocedure Evaluation (Addendum)
Anesthesia Evaluation  Patient identified by MRN, date of birth, ID band Patient awake    Reviewed: Allergy & Precautions, NPO status , Patient's Chart, lab work & pertinent test results  Airway Mallampati: III  TM Distance: >3 FB Neck ROM: Full    Dental no notable dental hx.    Pulmonary neg pulmonary ROS,    Pulmonary exam normal breath sounds clear to auscultation       Cardiovascular + Peripheral Vascular Disease  Normal cardiovascular exam Rhythm:Regular Rate:Normal  ECG: NSR, rate 92   Neuro/Psych negative neurological ROS  negative psych ROS   GI/Hepatic negative GI ROS, Neg liver ROS,   Endo/Other  negative endocrine ROS  Renal/GU negative Renal ROS     Musculoskeletal negative musculoskeletal ROS (+)   Abdominal   Peds  Hematology HLD   Anesthesia Other Findings PERIPHERAL VASCULAR DISEASE  Reproductive/Obstetrics                           Anesthesia Physical Anesthesia Plan  ASA: II  Anesthesia Plan: General   Post-op Pain Management:    Induction: Intravenous  PONV Risk Score and Plan: 2 and Ondansetron, Dexamethasone, Midazolam and Treatment may vary due to age or medical condition  Airway Management Planned: Oral ETT  Additional Equipment: Arterial line  Intra-op Plan:   Post-operative Plan: Extubation in OR  Informed Consent: I have reviewed the patients History and Physical, chart, labs and discussed the procedure including the risks, benefits and alternatives for the proposed anesthesia with the patient or authorized representative who has indicated his/her understanding and acceptance.     Dental advisory given  Plan Discussed with: CRNA  Anesthesia Plan Comments:        Anesthesia Quick Evaluation

## 2020-10-13 ENCOUNTER — Inpatient Hospital Stay (HOSPITAL_COMMUNITY)
Admission: RE | Admit: 2020-10-13 | Discharge: 2020-10-16 | DRG: 254 | Disposition: A | Discharge status: Home / Self Care | Payer: PPO | Attending: Vascular Surgery | Admitting: Vascular Surgery

## 2020-10-13 ENCOUNTER — Encounter (HOSPITAL_COMMUNITY)
Admission: RE | Disposition: A | Discharge status: Home / Self Care | Payer: Self-pay | Source: Home / Self Care | Attending: Vascular Surgery

## 2020-10-13 ENCOUNTER — Other Ambulatory Visit: Payer: Self-pay

## 2020-10-13 ENCOUNTER — Inpatient Hospital Stay (HOSPITAL_COMMUNITY): Payer: PPO | Admitting: Certified Registered"

## 2020-10-13 ENCOUNTER — Inpatient Hospital Stay (HOSPITAL_COMMUNITY): Payer: PPO | Admitting: Physician Assistant

## 2020-10-13 ENCOUNTER — Encounter (HOSPITAL_COMMUNITY): Payer: Self-pay | Admitting: Vascular Surgery

## 2020-10-13 DIAGNOSIS — Z20822 Contact with and (suspected) exposure to covid-19: Secondary | ICD-10-CM | POA: Diagnosis present

## 2020-10-13 DIAGNOSIS — Z7982 Long term (current) use of aspirin: Secondary | ICD-10-CM

## 2020-10-13 DIAGNOSIS — I70235 Atherosclerosis of native arteries of right leg with ulceration of other part of foot: Secondary | ICD-10-CM | POA: Diagnosis present

## 2020-10-13 DIAGNOSIS — I739 Peripheral vascular disease, unspecified: Secondary | ICD-10-CM | POA: Diagnosis present

## 2020-10-13 DIAGNOSIS — L97519 Non-pressure chronic ulcer of other part of right foot with unspecified severity: Secondary | ICD-10-CM | POA: Diagnosis present

## 2020-10-13 DIAGNOSIS — I70239 Atherosclerosis of native arteries of right leg with ulceration of unspecified site: Secondary | ICD-10-CM

## 2020-10-13 DIAGNOSIS — E785 Hyperlipidemia, unspecified: Secondary | ICD-10-CM | POA: Diagnosis present

## 2020-10-13 DIAGNOSIS — Z79899 Other long term (current) drug therapy: Secondary | ICD-10-CM | POA: Diagnosis not present

## 2020-10-13 DIAGNOSIS — Z7901 Long term (current) use of anticoagulants: Secondary | ICD-10-CM | POA: Diagnosis not present

## 2020-10-13 HISTORY — PX: FEMORAL-TIBIAL BYPASS GRAFT: SHX938

## 2020-10-13 LAB — POCT I-STAT 7, (LYTES, BLD GAS, ICA,H+H)
Acid-Base Excess: 6 mmol/L — ABNORMAL HIGH (ref 0.0–2.0)
Bicarbonate: 29.9 mmol/L — ABNORMAL HIGH (ref 20.0–28.0)
Calcium, Ion: 1.17 mmol/L (ref 1.15–1.40)
HCT: 35 % — ABNORMAL LOW (ref 39.0–52.0)
Hemoglobin: 11.9 g/dL — ABNORMAL LOW (ref 13.0–17.0)
O2 Saturation: 100 %
Potassium: 4.5 mmol/L (ref 3.5–5.1)
Sodium: 137 mmol/L (ref 135–145)
TCO2: 31 mmol/L (ref 22–32)
pCO2 arterial: 41.1 mmHg (ref 32.0–48.0)
pH, Arterial: 7.471 — ABNORMAL HIGH (ref 7.350–7.450)
pO2, Arterial: 254 mmHg — ABNORMAL HIGH (ref 83.0–108.0)

## 2020-10-13 LAB — POCT ACTIVATED CLOTTING TIME
Activated Clotting Time: 202 seconds
Activated Clotting Time: 208 seconds
Activated Clotting Time: 231 seconds
Activated Clotting Time: 237 seconds
Activated Clotting Time: 225 seconds
Activated Clotting Time: 232 seconds
Activated Clotting Time: 237 seconds

## 2020-10-13 LAB — CBC
HCT: 36 % — ABNORMAL LOW (ref 39.0–52.0)
Hemoglobin: 12.1 g/dL — ABNORMAL LOW (ref 13.0–17.0)
MCH: 31.6 pg (ref 26.0–34.0)
MCHC: 33.6 g/dL (ref 30.0–36.0)
MCV: 94 fL (ref 80.0–100.0)
Platelets: 224 10*3/uL (ref 150–400)
RBC: 3.83 MIL/uL — ABNORMAL LOW (ref 4.22–5.81)
RDW: 12.1 % (ref 11.5–15.5)
WBC: 14.5 10*3/uL — ABNORMAL HIGH (ref 4.0–10.5)
nRBC: 0 % (ref 0.0–0.2)

## 2020-10-13 LAB — ABO/RH: ABO/RH(D): AB POS

## 2020-10-13 SURGERY — CREATION, BYPASS, ARTERIAL, FEMORAL TO TIBIAL, USING GRAFT
Anesthesia: General | Site: Leg Lower | Laterality: Right

## 2020-10-13 MED ORDER — MORPHINE SULFATE (PF) 2 MG/ML IV SOLN
2.0000 mg | INTRAVENOUS | Status: DC | PRN
  Administered 2020-10-13 – 2020-10-16 (×2): 2 mg via INTRAVENOUS
  Filled 2020-10-13 (×2): qty 1

## 2020-10-13 MED ORDER — SUGAMMADEX SODIUM 200 MG/2ML IV SOLN
INTRAVENOUS | Status: DC | PRN
  Administered 2020-10-13: 200 mg via INTRAVENOUS

## 2020-10-13 MED ORDER — PROPOFOL 10 MG/ML IV BOLUS
INTRAVENOUS | Status: DC | PRN
  Administered 2020-10-13: 100 mg via INTRAVENOUS

## 2020-10-13 MED ORDER — PHENYLEPHRINE 40 MCG/ML (10ML) SYRINGE FOR IV PUSH (FOR BLOOD PRESSURE SUPPORT)
PREFILLED_SYRINGE | INTRAVENOUS | Status: AC
  Filled 2020-10-13: qty 10

## 2020-10-13 MED ORDER — EPHEDRINE 5 MG/ML INJ
INTRAVENOUS | Status: AC
  Filled 2020-10-13: qty 10

## 2020-10-13 MED ORDER — MIDAZOLAM HCL 2 MG/2ML IJ SOLN
INTRAMUSCULAR | Status: AC
  Filled 2020-10-13: qty 2

## 2020-10-13 MED ORDER — PROPOFOL 10 MG/ML IV BOLUS
INTRAVENOUS | Status: AC
  Filled 2020-10-13: qty 20

## 2020-10-13 MED ORDER — PHENOL 1.4 % MT LIQD: 1.0000 | OROMUCOSAL | Status: DC | PRN

## 2020-10-13 MED ORDER — LABETALOL HCL 5 MG/ML IV SOLN: 10.0000 mg | INTRAVENOUS | Status: DC | PRN

## 2020-10-13 MED ORDER — BISACODYL 10 MG RE SUPP: 10.0000 mg | Freq: Every day | RECTAL | Status: DC | PRN

## 2020-10-13 MED ORDER — ROSUVASTATIN CALCIUM 20 MG PO TABS
40.0000 mg | ORAL_TABLET | Freq: Every evening | ORAL | Status: DC
  Administered 2020-10-13 – 2020-10-15 (×3): 40 mg via ORAL
  Filled 2020-10-13 (×3): qty 2

## 2020-10-13 MED ORDER — POLYETHYLENE GLYCOL 3350 17 G PO PACK
17.0000 g | PACK | Freq: Every day | ORAL | Status: DC | PRN
  Administered 2020-10-15: 17 g via ORAL
  Filled 2020-10-13: qty 1

## 2020-10-13 MED ORDER — DEXAMETHASONE SODIUM PHOSPHATE 10 MG/ML IJ SOLN
INTRAMUSCULAR | Status: DC | PRN
  Administered 2020-10-13: 10 mg via INTRAVENOUS

## 2020-10-13 MED ORDER — FENTANYL CITRATE (PF) 100 MCG/2ML IJ SOLN: 25.0000 ug | INTRAMUSCULAR | Status: DC | PRN

## 2020-10-13 MED ORDER — ORAL CARE MOUTH RINSE: 15.0000 mL | Freq: Once | OROMUCOSAL | Status: AC

## 2020-10-13 MED ORDER — CILOSTAZOL 50 MG PO TABS
50.0000 mg | ORAL_TABLET | Freq: Two times a day (BID) | ORAL | Status: DC
  Administered 2020-10-13 – 2020-10-16 (×6): 50 mg via ORAL
  Filled 2020-10-13 (×7): qty 1

## 2020-10-13 MED ORDER — VANCOMYCIN HCL 1000 MG/200ML IV SOLN
1000.0000 mg | Freq: Two times a day (BID) | INTRAVENOUS | Status: AC
  Administered 2020-10-13 – 2020-10-14 (×2): 1000 mg via INTRAVENOUS
  Filled 2020-10-13 (×2): qty 200

## 2020-10-13 MED ORDER — ONDANSETRON HCL 4 MG/2ML IJ SOLN
INTRAMUSCULAR | Status: DC | PRN
  Administered 2020-10-13: 4 mg via INTRAVENOUS

## 2020-10-13 MED ORDER — DEXAMETHASONE SODIUM PHOSPHATE 10 MG/ML IJ SOLN
INTRAMUSCULAR | Status: AC
  Filled 2020-10-13: qty 1

## 2020-10-13 MED ORDER — ACETAMINOPHEN 500 MG PO TABS
1000.0000 mg | ORAL_TABLET | Freq: Once | ORAL | Status: AC
  Administered 2020-10-13: 1000 mg via ORAL
  Filled 2020-10-13: qty 2

## 2020-10-13 MED ORDER — VANCOMYCIN HCL IN DEXTROSE 1-5 GM/200ML-% IV SOLN
1000.0000 mg | INTRAVENOUS | Status: AC
  Administered 2020-10-13: 1000 mg via INTRAVENOUS
  Filled 2020-10-13: qty 200

## 2020-10-13 MED ORDER — HYDRALAZINE HCL 20 MG/ML IJ SOLN: 5.0000 mg | INTRAMUSCULAR | Status: DC | PRN

## 2020-10-13 MED ORDER — SUFENTANIL CITRATE 50 MCG/ML IV SOLN
INTRAVENOUS | Status: DC | PRN
  Administered 2020-10-13: 10 ug via INTRAVENOUS
  Administered 2020-10-13: 20 ug via INTRAVENOUS
  Administered 2020-10-13: 10 ug via INTRAVENOUS

## 2020-10-13 MED ORDER — MAGNESIUM SULFATE 2 GM/50ML IV SOLN: 2.0000 g | Freq: Every day | INTRAVENOUS | Status: DC | PRN

## 2020-10-13 MED ORDER — CHLORHEXIDINE GLUCONATE 0.12 % MT SOLN: 15.0000 mL | Freq: Once | OROMUCOSAL | Status: AC

## 2020-10-13 MED ORDER — LIDOCAINE 2% (20 MG/ML) 5 ML SYRINGE
INTRAMUSCULAR | Status: AC
  Filled 2020-10-13: qty 5

## 2020-10-13 MED ORDER — ROCURONIUM BROMIDE 10 MG/ML (PF) SYRINGE
PREFILLED_SYRINGE | INTRAVENOUS | Status: AC
  Filled 2020-10-13: qty 10

## 2020-10-13 MED ORDER — PANTOPRAZOLE SODIUM 40 MG PO TBEC
40.0000 mg | DELAYED_RELEASE_TABLET | Freq: Every day | ORAL | Status: DC
  Administered 2020-10-14 – 2020-10-16 (×3): 40 mg via ORAL
  Filled 2020-10-13 (×3): qty 1

## 2020-10-13 MED ORDER — ONDANSETRON HCL 4 MG/2ML IJ SOLN: 4.0000 mg | Freq: Four times a day (QID) | INTRAMUSCULAR | Status: DC | PRN

## 2020-10-13 MED ORDER — PHENYLEPHRINE HCL-NACL 10-0.9 MG/250ML-% IV SOLN
INTRAVENOUS | Status: DC | PRN
  Administered 2020-10-13: 30 ug/min via INTRAVENOUS

## 2020-10-13 MED ORDER — SUFENTANIL CITRATE 50 MCG/ML IV SOLN
INTRAVENOUS | Status: AC
  Filled 2020-10-13: qty 1

## 2020-10-13 MED ORDER — SODIUM CHLORIDE 0.9 % IV SOLN: 500.0000 mL | Freq: Once | INTRAVENOUS | Status: DC | PRN

## 2020-10-13 MED ORDER — GUAIFENESIN-DM 100-10 MG/5ML PO SYRP: 15.0000 mL | ORAL_SOLUTION | ORAL | Status: DC | PRN

## 2020-10-13 MED ORDER — SODIUM CHLORIDE 0.9 % IV SOLN: INTRAVENOUS | Status: DC

## 2020-10-13 MED ORDER — CHLORHEXIDINE GLUCONATE CLOTH 2 % EX PADS
6.0000 | MEDICATED_PAD | Freq: Every day | CUTANEOUS | Status: DC
  Administered 2020-10-13 – 2020-10-16 (×3): 6 via TOPICAL

## 2020-10-13 MED ORDER — HEPARIN SODIUM (PORCINE) 1000 UNIT/ML IJ SOLN
INTRAMUSCULAR | Status: DC | PRN
  Administered 2020-10-13 (×4): 3000 [IU] via INTRAVENOUS
  Administered 2020-10-13: 7000 [IU] via INTRAVENOUS

## 2020-10-13 MED ORDER — EPHEDRINE SULFATE-NACL 50-0.9 MG/10ML-% IV SOSY
PREFILLED_SYRINGE | INTRAVENOUS | Status: DC | PRN
  Administered 2020-10-13: 10 mg via INTRAVENOUS

## 2020-10-13 MED ORDER — LACTATED RINGERS IV SOLN: INTRAVENOUS | Status: DC

## 2020-10-13 MED ORDER — POTASSIUM CHLORIDE CRYS ER 20 MEQ PO TBCR: 20.0000 meq | EXTENDED_RELEASE_TABLET | Freq: Every day | ORAL | Status: DC | PRN

## 2020-10-13 MED ORDER — OXYCODONE-ACETAMINOPHEN 5-325 MG PO TABS
2.0000 | ORAL_TABLET | ORAL | Status: DC | PRN
  Administered 2020-10-13 – 2020-10-15 (×8): 2 via ORAL
  Filled 2020-10-13 (×8): qty 2

## 2020-10-13 MED ORDER — CHLORHEXIDINE GLUCONATE 0.12 % MT SOLN
OROMUCOSAL | Status: AC
  Administered 2020-10-13: 15 mL via OROMUCOSAL
  Filled 2020-10-13: qty 15

## 2020-10-13 MED ORDER — SODIUM CHLORIDE 0.9 % IV SOLN
INTRAVENOUS | Status: DC | PRN
  Administered 2020-10-13: 500 mL

## 2020-10-13 MED ORDER — METOPROLOL TARTRATE 5 MG/5ML IV SOLN: 2.0000 mg | INTRAVENOUS | Status: DC | PRN

## 2020-10-13 MED ORDER — ROCURONIUM BROMIDE 100 MG/10ML IV SOLN
INTRAVENOUS | Status: DC | PRN
  Administered 2020-10-13: 20 mg via INTRAVENOUS
  Administered 2020-10-13: 30 mg via INTRAVENOUS
  Administered 2020-10-13: 20 mg via INTRAVENOUS
  Administered 2020-10-13: 60 mg via INTRAVENOUS
  Administered 2020-10-13: 30 mg via INTRAVENOUS
  Administered 2020-10-13 (×2): 20 mg via INTRAVENOUS

## 2020-10-13 MED ORDER — ASPIRIN EC 81 MG PO TBEC
81.0000 mg | DELAYED_RELEASE_TABLET | Freq: Every day | ORAL | Status: DC
  Administered 2020-10-13 – 2020-10-16 (×4): 81 mg via ORAL
  Filled 2020-10-13 (×4): qty 1

## 2020-10-13 MED ORDER — SODIUM CHLORIDE 0.9 % IV SOLN
INTRAVENOUS | Status: AC
  Filled 2020-10-13: qty 1.2

## 2020-10-13 MED ORDER — PHENYLEPHRINE HCL (PRESSORS) 10 MG/ML IV SOLN
INTRAVENOUS | Status: DC | PRN
  Administered 2020-10-13: 80 ug via INTRAVENOUS

## 2020-10-13 MED ORDER — ALBUMIN HUMAN 5 % IV SOLN: INTRAVENOUS | Status: DC | PRN

## 2020-10-13 MED ORDER — MIDAZOLAM HCL 2 MG/2ML IJ SOLN
INTRAMUSCULAR | Status: DC | PRN
  Administered 2020-10-13: 2 mg via INTRAVENOUS

## 2020-10-13 MED ORDER — CHLORHEXIDINE GLUCONATE CLOTH 2 % EX PADS: 6.0000 | MEDICATED_PAD | Freq: Once | CUTANEOUS | Status: DC

## 2020-10-13 MED ORDER — EZETIMIBE 10 MG PO TABS
10.0000 mg | ORAL_TABLET | Freq: Every evening | ORAL | Status: DC
  Administered 2020-10-13 – 2020-10-15 (×3): 10 mg via ORAL
  Filled 2020-10-13 (×3): qty 1

## 2020-10-13 MED ORDER — ALUM & MAG HYDROXIDE-SIMETH 200-200-20 MG/5ML PO SUSP: 15.0000 mL | ORAL | Status: DC | PRN

## 2020-10-13 MED ORDER — 0.9 % SODIUM CHLORIDE (POUR BTL) OPTIME
TOPICAL | Status: DC | PRN
  Administered 2020-10-13: 2000 mL

## 2020-10-13 MED ORDER — ONDANSETRON HCL 4 MG/2ML IJ SOLN
INTRAMUSCULAR | Status: AC
  Filled 2020-10-13: qty 2

## 2020-10-13 MED ORDER — ACETAMINOPHEN 325 MG PO TABS
325.0000 mg | ORAL_TABLET | ORAL | Status: DC | PRN
  Administered 2020-10-16: 650 mg via ORAL
  Filled 2020-10-13: qty 2

## 2020-10-13 MED ORDER — DOCUSATE SODIUM 100 MG PO CAPS
100.0000 mg | ORAL_CAPSULE | Freq: Every day | ORAL | Status: DC
  Administered 2020-10-14 – 2020-10-16 (×3): 100 mg via ORAL
  Filled 2020-10-13 (×3): qty 1

## 2020-10-13 MED ORDER — LIDOCAINE HCL (CARDIAC) PF 100 MG/5ML IV SOSY
PREFILLED_SYRINGE | INTRAVENOUS | Status: DC | PRN
  Administered 2020-10-13: 40 mg via INTRAVENOUS
  Administered 2020-10-13: 60 mg via INTRAVENOUS

## 2020-10-13 MED ORDER — HEPARIN (PORCINE) 25000 UT/250ML-% IV SOLN
1350.0000 [IU]/h | INTRAVENOUS | Status: DC
  Administered 2020-10-13: 500 [IU]/h via INTRAVENOUS
  Administered 2020-10-15: 1150 [IU]/h via INTRAVENOUS
  Filled 2020-10-13 (×2): qty 250

## 2020-10-13 MED ORDER — ACETAMINOPHEN 650 MG RE SUPP
325.0000 mg | RECTAL | Status: DC | PRN
Start: 2020-10-13 — End: 2020-10-16

## 2020-10-13 SURGICAL SUPPLY — 70 items
ADH SKN CLS APL DERMABOND .7 (GAUZE/BANDAGES/DRESSINGS) ×3
APL PRP STRL LF DISP 70% ISPRP (MISCELLANEOUS) ×2
APL SKNCLS STERI-STRIP NONHPOA (GAUZE/BANDAGES/DRESSINGS) ×3
BANDAGE ESMARK 6X9 LF (GAUZE/BANDAGES/DRESSINGS) IMPLANT
BENZOIN TINCTURE PRP APPL 2/3 (GAUZE/BANDAGES/DRESSINGS) ×6 IMPLANT
BNDG CMPR 9X6 STRL LF SNTH (GAUZE/BANDAGES/DRESSINGS)
BNDG ESMARK 6X9 LF (GAUZE/BANDAGES/DRESSINGS)
CANISTER SUCT 3000ML PPV (MISCELLANEOUS) ×2 IMPLANT
CANNULA VESSEL 3MM 2 BLNT TIP (CANNULA) ×1 IMPLANT
CATH EMB 3FR 80CM (CATHETERS) ×1 IMPLANT
CATH EMB 4FR 80CM (CATHETERS) ×1 IMPLANT
CHLORAPREP W/TINT 26 (MISCELLANEOUS) ×4 IMPLANT
CLIP VESOCCLUDE MED 24/CT (CLIP) ×2 IMPLANT
CLIP VESOCCLUDE SM WIDE 24/CT (CLIP) ×3 IMPLANT
COVER SURGICAL LIGHT HANDLE (MISCELLANEOUS) ×1 IMPLANT
COVER WAND RF STERILE (DRAPES) ×1 IMPLANT
CUFF TOURN SGL QUICK 24 (TOURNIQUET CUFF)
CUFF TOURN SGL QUICK 34 (TOURNIQUET CUFF)
CUFF TOURN SGL QUICK 42 (TOURNIQUET CUFF) IMPLANT
CUFF TRNQT CYL 24X4X16.5-23 (TOURNIQUET CUFF) IMPLANT
CUFF TRNQT CYL 34X4.125X (TOURNIQUET CUFF) IMPLANT
DERMABOND ADVANCED (GAUZE/BANDAGES/DRESSINGS) ×3
DERMABOND ADVANCED .7 DNX12 (GAUZE/BANDAGES/DRESSINGS) IMPLANT
DRAIN CHANNEL 15F RND FF W/TCR (WOUND CARE) IMPLANT
DRAPE C-ARM 42X72 X-RAY (DRAPES) IMPLANT
DRAPE HALF SHEET 40X57 (DRAPES) IMPLANT
DRAPE X-RAY CASS 24X20 (DRAPES) IMPLANT
ELECT REM PT RETURN 9FT ADLT (ELECTROSURGICAL) ×2
ELECTRODE REM PT RTRN 9FT ADLT (ELECTROSURGICAL) ×1 IMPLANT
EVACUATOR SILICONE 100CC (DRAIN) IMPLANT
GAUZE SPONGE 4X4 12PLY STRL (GAUZE/BANDAGES/DRESSINGS) ×2 IMPLANT
GAUZE SPONGE 4X4 12PLY STRL LF (GAUZE/BANDAGES/DRESSINGS) ×1 IMPLANT
GLOVE BIO SURGEON STRL SZ7.5 (GLOVE) ×2 IMPLANT
GLOVE SURG SS PI 8.0 STRL IVOR (GLOVE) ×2 IMPLANT
GOWN STRL REUS W/ TWL LRG LVL3 (GOWN DISPOSABLE) ×2 IMPLANT
GOWN STRL REUS W/ TWL XL LVL3 (GOWN DISPOSABLE) ×1 IMPLANT
GOWN STRL REUS W/TWL LRG LVL3 (GOWN DISPOSABLE) ×4
GOWN STRL REUS W/TWL XL LVL3 (GOWN DISPOSABLE) ×2
HEMOSTAT SNOW SURGICEL 2X4 (HEMOSTASIS) IMPLANT
INSERT FOGARTY SM (MISCELLANEOUS) ×1 IMPLANT
KIT BASIN OR (CUSTOM PROCEDURE TRAY) ×2 IMPLANT
KIT TURNOVER KIT B (KITS) ×2 IMPLANT
MARKER GRAFT CORONARY BYPASS (MISCELLANEOUS) IMPLANT
NS IRRIG 1000ML POUR BTL (IV SOLUTION) ×4 IMPLANT
PACK PERIPHERAL VASCULAR (CUSTOM PROCEDURE TRAY) ×2 IMPLANT
PAD ABD 8X10 STRL (GAUZE/BANDAGES/DRESSINGS) ×1 IMPLANT
PAD ARMBOARD 7.5X6 YLW CONV (MISCELLANEOUS) ×4 IMPLANT
SET COLLECT BLD 21X3/4 12 (NEEDLE) IMPLANT
SPONGE LAP 18X18 X RAY DECT (DISPOSABLE) ×1 IMPLANT
STAPLER VISISTAT 35W (STAPLE) ×1 IMPLANT
STOPCOCK 4 WAY LG BORE MALE ST (IV SETS) IMPLANT
STRIP CLOSURE SKIN 1/2X4 (GAUZE/BANDAGES/DRESSINGS) ×6 IMPLANT
SUT ETHILON 3 0 PS 1 (SUTURE) ×3 IMPLANT
SUT MNCRL AB 4-0 PS2 18 (SUTURE) ×5 IMPLANT
SUT PROLENE 5 0 C 1 24 (SUTURE) ×3 IMPLANT
SUT PROLENE 6 0 BV (SUTURE) ×6 IMPLANT
SUT PROLENE 7 0 BV 1 (SUTURE) ×1 IMPLANT
SUT SILK 2 0 SH (SUTURE) ×2 IMPLANT
SUT SILK 3 0 (SUTURE) ×4
SUT SILK 3-0 18XBRD TIE 12 (SUTURE) IMPLANT
SUT VIC AB 2-0 CT1 27 (SUTURE) ×4
SUT VIC AB 2-0 CT1 TAPERPNT 27 (SUTURE) ×2 IMPLANT
SUT VIC AB 3-0 SH 27 (SUTURE) ×8
SUT VIC AB 3-0 SH 27X BRD (SUTURE) ×2 IMPLANT
SYR 3ML LL SCALE MARK (SYRINGE) ×1 IMPLANT
TAPE CLOTH 4X10 WHT NS (GAUZE/BANDAGES/DRESSINGS) ×1 IMPLANT
TOWEL GREEN STERILE (TOWEL DISPOSABLE) ×2 IMPLANT
TRAY FOLEY MTR SLVR 16FR STAT (SET/KITS/TRAYS/PACK) ×2 IMPLANT
UNDERPAD 30X36 HEAVY ABSORB (UNDERPADS AND DIAPERS) ×2 IMPLANT
WATER STERILE IRR 1000ML POUR (IV SOLUTION) ×2 IMPLANT

## 2020-10-13 NOTE — Anesthesia Procedure Notes (Signed)
Procedure Name: Intubation Date/Time: 10/13/2020 8:06 AM Performed by: Claris Che, CRNA Pre-anesthesia Checklist: Patient identified, Emergency Drugs available, Suction available and Patient being monitored Patient Re-evaluated:Patient Re-evaluated prior to induction Oxygen Delivery Method: Circle system utilized Preoxygenation: Pre-oxygenation with 100% oxygen Induction Type: IV induction Ventilation: Mask ventilation without difficulty Laryngoscope Size: Mac and 4 Grade View: Grade I Tube type: Oral Tube size: 7.5 mm Number of attempts: 1 Airway Equipment and Method: Stylet Placement Confirmation: ETT inserted through vocal cords under direct vision and positive ETCO2 Dental Injury: Teeth and Oropharynx as per pre-operative assessment  Comments: Intubation by Jettie Pagan SRNA

## 2020-10-13 NOTE — Anesthesia Procedure Notes (Signed)
Arterial Line Insertion Start/End4/02/2021 8:00 AM, 10/13/2020 8:03 AM Performed by: Claris Che, CRNA, CRNA  Patient location: OR. Preanesthetic checklist: patient identified, IV checked, monitors and equipment checked and pre-op evaluation Lidocaine 1% used for infiltration and patient sedated Right, radial was placed Catheter size: 20 G Hand hygiene performed  and maximum sterile barriers used   Attempts: 2 Procedure performed without using ultrasound guided technique. Following insertion, Biopatch. Post procedure assessment: normal

## 2020-10-13 NOTE — Discharge Instructions (Signed)
 [facility] [place]  Discharge instructions  Lower Extremity Bypass Surgery  Please refer to the following instruction for your post-procedure care. Your surgeon or physician assistant will discuss any changes with you.  Activity  You are encouraged to walk as much as you can. You can slowly return to normal activities during the month after your surgery. Avoid strenuous activity and heavy lifting until your doctor tells you it's OK. Avoid activities such as vacuuming or swinging a golf club. Do not drive until your doctor give the OK and you are no longer taking prescription pain medications. It is also normal to have difficulty with sleep habits, eating and bowel movement after surgery. These will go away with time.  Bathing/Showering  Shower daily after you go home. Do not soak in a bathtub, hot tub, or swim until the incision heals completely.  Incision Care  Clean your incision with mild soap and water. Shower every day. Pat the area dry with a clean towel. You do not need a bandage unless otherwise instructed. Do not apply any ointments or creams to your incision. If you have open wounds you will be instructed how to care for them or a visiting nurse may be arranged for you. If you have staples or sutures along your incision they will be removed at your post-op appointment. You may have skin glue on your incision. Do not peel it off. It will come off on its own in about one week.  Wash the groin wound with soap and water daily and pat dry. (No tub bath-only shower)  Then put a dry gauze or washcloth in the groin to keep this area dry to help prevent wound infection.  Do this daily and as needed.  Do not use Vaseline or neosporin on your incisions.  Only use soap and water on your incisions and then protect and keep dry.  Diet  Resume your normal diet. There are no special food restrictions following this procedure. A low fat/ low cholesterol diet is  recommended for all patients with vascular disease. In order to heal from your surgery, it is CRITICAL to get adequate nutrition. Your body requires vitamins, minerals, and protein. Vegetables are the best source of vitamins and minerals. Vegetables also provide the perfect balance of protein. Processed food has little nutritional value, so try to avoid this.  Medications  Resume taking all your medications unless your doctor or physician assistant tells you not to. If your incision is causing pain, you may take over-the-counter pain relievers such as acetaminophen (Tylenol). If you were prescribed a stronger pain medication, please aware these medication can cause nausea and constipation. Prevent nausea by taking the medication with a snack or meal. Avoid constipation by drinking plenty of fluids and eating foods with high amount of fiber, such as fruits, vegetables, and grains. Take Colace 100 mg (an over-the-counter stool softener) twice a day as needed for constipation.  Do not take Tylenol if you are taking prescription pain medications.  Follow Up  Our office will schedule a follow up appointment 2-3 weeks following discharge.  Please call us immediately for any of the following conditions  Severe or worsening pain in your legs or feet while at rest or while walking Increase pain, redness, warmth, or drainage (pus) from your incision site(s) Fever of 101 degree or higher The swelling in your leg with the bypass suddenly worsens and becomes more painful than when you were in the hospital If you have   been instructed to feel your graft pulse then you should do so every day. If you can no longer feel this pulse, call the office immediately. Not all patients are given this instruction.  Leg swelling is common after leg bypass surgery.  The swelling should improve over a few months following surgery. To improve the swelling, you may elevate your legs above the level of your heart while you are  sitting or resting. Your surgeon or physician assistant may ask you to apply an ACE wrap or wear compression (TED) stockings to help to reduce swelling.  Reduce your risk of vascular disease  Stop smoking. If you would like help call [facility] at 1-800-QUIT-NOW (1-800-784-8669) or [place] at 336-586-4000.  Manage your cholesterol Maintain a desired weight Control your diabetes weight Control your diabetes Keep your blood pressure down  If you have any questions, please call the office at [phone]  

## 2020-10-13 NOTE — Transfer of Care (Signed)
Immediate Anesthesia Transfer of Care Note  Patient: [name]  Procedure(s) Performed: RIGHT COMMON FEMORAL ARTERY TO PERONEAL ARTERY BYPASS WITH INSITU GREATER SAPHENOUS VEIN (Right Leg Lower)  Patient Location: PACU  Anesthesia Type:General  Level of Consciousness: awake, alert , oriented and patient cooperative  Airway & Oxygen Therapy: Patient Spontanous Breathing and Patient connected to face mask oxygen  Post-op Assessment: Report given to RN, Post -op Vital signs reviewed and stable and Patient moving all extremities  Post vital signs: Reviewed and stable  Last Vitals:  Vitals Value Taken Time  BP 127/76 10/13/20 1322  Temp    Pulse 110 10/13/20 1326  Resp 16 10/13/20 1326  SpO2 97 % 10/13/20 1326  Vitals shown include unvalidated device data.  Last Pain:  Vitals:   10/13/20 0602  TempSrc: Oral  PainSc:          Complications: No complications documented.

## 2020-10-13 NOTE — Op Note (Signed)
DATE OF SERVICE: 10/13/2020  PATIENT:  [name]  70 y.o. male  PRE-OPERATIVE DIAGNOSIS:  Atherosclerosis of native arteries of right lower extremity causing ulceration  POST-OPERATIVE DIAGNOSIS:  Same  PROCEDURE:   right common femoral to peroneal artery bypass with in-situ greater saphenous vein  SURGEON:  Surgeon(s) and Role:    * [name], MD - Primary    * Early, Arvilla Meres, MD  ASSISTANT: Curt Jews, MD  An assistant was required to facilitate exposure and expedite the case.  ANESTHESIA:   general  EBL: 241mL  BLOOD ADMINISTERED:none  DRAINS: none   LOCAL MEDICATIONS USED:  NONE  SPECIMEN:  none  COUNTS: confirmed correct.  TOURNIQUET:  None  PATIENT DISPOSITION:  PACU - hemodynamically stable.   Delay start of Pharmacological VTE agent (>24hrs) due to surgical blood loss or risk of bleeding: no  INDICATION FOR PROCEDURE: [name] is a 70 y.o. male with severe atherosclerotic disease causing ischemic ulceration.  Preoperative angiogram showed occlusion of native arteries from the femoral bifurcation to the ankle.  There was reconstitution of the distal peroneal artery.  After careful discussion of risks, benefits, and alternatives the patient was offered him peroneal bypass. The patient understood and wished to proceed.  OPERATIVE FINDINGS: Exposed peroneal artery as distally as possible.  This was quite difficult because of severe inflammation and densely adherent veins.  Ultimately we were able to expose the peroneal artery.  He was clamped proximally distally and opened.  This appeared appropriate for bypass.  We were able to pass a #2 coronary dilator distally to the ankle.  Moderate backbleeding was encountered proximally and distally.  The like to proceed with the bypass.  The greater saphenous vein was exposed on the anterior surface using skip incisions to the groin.  The common femoral artery was exposed using horizontal incision.  In situ  bypass was performed.  Palpable pulse was noted throughout class upon completion.  There is a peroneal signal upon completion.  I did not reverse the patient's heparinization.  Low-dose, not titrating infusion should continue overnight.  Transition to therapeutic heparin tomorrow if no bleeding.  DESCRIPTION OF PROCEDURE: After identification of the patient in the pre-operative holding area, the patient was transferred to the operating room. The patient was positioned supine on the operating room table. Anesthesia was induced. The right leg was prepped and draped in standard fashion. A surgical pause was performed confirming correct patient, procedure, and operative location.  Using intraoperative ultrasound, the course of the greater saphenous vein at the ankle and calf was mapped.  The vein was exposed.  The soleus muscle was divided off of its tibial attachments to enter the deep posterior compartment of the leg.  We exposed the peroneal vascular bundle in this typical position deep in this compartment near the fibula.  We skeletonized the area with great difficulty.  The bundle was densely adherent and multiple areas of bleeding were encountered and treated with Prolene suture and surgical clips.  Ultimately we were able to expose a segment of peroneal artery.  Patient was heparinized.  Bulldog clamps were applied proximally distally to the artery.  An anterior arteriotomy was made with 11 blade and extended with Potts scissors.  Clamps were released moderate backbleeding was encountered proximally.  Minimal backbleeding was encountered distally.  I then passed a #1.5 and a #2 coronary dilator distally.  Backbleeding was much improved from the distal artery. We elected to proceed with bypass.  The  greater saphenous vein was exposed with skip incisions from the exposure to the groin. Branches were identified and preserved for valve lysis.   An oblique incision was made over the common femoral artery and  carried down through subcutaneous tissue until the femoral sheath was encountered.  This was incised sharply.  Common femoral artery was exposed sharply.  I carried the exposure down to the femoral bifurcation.  Palpable pulse was noted in the superficial femoral artery or the profunda femoris artery as anticipated by the angiogram.    The saphenofemoral junction was identified, skeletonized, clamped with a Cooley clamp.  The base of the greater saphenous vein was transected above the clamp.  The venotomy was closed in 2 layers using 5-0 Prolene. Hemostasis was achieved.   The vein was swung over the the common femoral artery.  A longitudinal arteriotomy was made over the common femoral artery using 11 blade and extended with Potts scissors.  This consults I tried to pass a #4 Fogarty embolectomy catheter into the superficial femoral vein but was not able to do so.  The greater saphenous vein was then anastomosed end to side to the common femoral arteriotomy using continuous running suture of 5-0 Prolene.  Hemostasis was achieved in the anastomosis.  Using the previously identified sidebranches the greater saphenous vein, we made small venotomies and passed a Mills valvulotome to perform valve lysis in the greater saphenous vein.  All side branches were then divided.  We confirmed no diastolic flow graft with Doppler machine inferomedially, no persistent arteriovenous fistula.  After valve lysis, pulsatile blood was noted at the end of the graft.  The vein was pressurized and spatulated to allow end-to-side anastomosis to the previously created peroneal arteriotomy. The vein conduit was then sewn end-to-side to the peroneal artery using continuous running suture of 7-O prolene. Immediately prior to completion we passed a #2 coronary dilator across the anastomosis to confirm the bypass was widely patent. Upon completion a palpable pulse was noted in the graft and distal peroneal artery. A peroneal signal was  noted at the ankle. Heparin was not reversed. The wounds were closed in layers using 2-O vicryl, 3-O vicryl. At the calf the wound was closed with 2-O nylon and staples. The remainder of the incisions were closed with 4-O monocryl. Dermabond was applied.  Upon completion of the case instrument and sharps counts were confirmed correct. The patient was transferred to the PACU in good condition. I was present for all portions of the procedure.  [name]. Stanford Breed, MD [facility] Phone Number: [phone] 10/13/2020 1:18 PM

## 2020-10-13 NOTE — Interval H&P Note (Signed)
History and Physical Interval Note:  10/13/2020 7:23 AM  [name]  has presented today for surgery, with the diagnosis of PERIPHERAL VASCULAR DISEASE.  The various methods of treatment have been discussed with the patient and family. After consideration of risks, benefits and other options for treatment, the patient has consented to  Procedure(s): RIGHT COMMON FEMORAL ARTERY TO MID/DISTAL PERONEAL ARTERY BYPASS (Right) as a surgical intervention.  The patient's history has been reviewed, patient examined, no change in status, stable for surgery.  I have reviewed the patient's chart and labs.  Questions were answered to the patient's satisfaction.     [name]

## 2020-10-13 NOTE — Anesthesia Postprocedure Evaluation (Signed)
Anesthesia Post Note  Patient: Romie Minus  Procedure(s) Performed: RIGHT COMMON FEMORAL ARTERY TO PERONEAL ARTERY BYPASS WITH INSITU GREATER SAPHENOUS VEIN (Right Leg Lower)     Patient location during evaluation: PACU Anesthesia Type: General Level of consciousness: awake Pain management: pain level controlled Vital Signs Assessment: post-procedure vital signs reviewed and stable Respiratory status: spontaneous breathing Cardiovascular status: stable Postop Assessment: no apparent nausea or vomiting Anesthetic complications: no   No complications documented.  Last Vitals:  Vitals:   10/13/20 1450 10/13/20 1530  BP: 112/68   Pulse: 93 94  Resp: 16 16  Temp:  37.4 C  SpO2: 97% 97%    Last Pain:  Vitals:   10/13/20 1450  TempSrc:   PainSc: 0-No pain                 Kiah Keay

## 2020-10-14 ENCOUNTER — Encounter (HOSPITAL_COMMUNITY): Payer: PPO

## 2020-10-14 ENCOUNTER — Encounter (HOSPITAL_COMMUNITY): Payer: Self-pay | Admitting: Vascular Surgery

## 2020-10-14 LAB — BASIC METABOLIC PANEL
Anion gap: 5 (ref 5–15)
BUN: 15 mg/dL (ref 8–23)
CO2: 28 mmol/L (ref 22–32)
Calcium: 7.8 mg/dL — ABNORMAL LOW (ref 8.9–10.3)
Chloride: 103 mmol/L (ref 98–111)
Creatinine, Ser: 0.85 mg/dL (ref 0.61–1.24)
GFR, Estimated: >60 mL/min (ref >60)
Glucose, Bld: 121 mg/dL — ABNORMAL HIGH (ref 70–99)
Potassium: 4.2 mmol/L (ref 3.5–5.1)
Sodium: 136 mmol/L (ref 135–145)

## 2020-10-14 LAB — CBC
HCT: 30.9 % — ABNORMAL LOW (ref 39.0–52.0)
Hemoglobin: 10.4 g/dL — ABNORMAL LOW (ref 13.0–17.0)
MCH: 31.7 pg (ref 26.0–34.0)
MCHC: 33.7 g/dL (ref 30.0–36.0)
MCV: 94.2 fL (ref 80.0–100.0)
Platelets: 198 10*3/uL (ref 150–400)
RBC: 3.28 MIL/uL — ABNORMAL LOW (ref 4.22–5.81)
RDW: 12.4 % (ref 11.5–15.5)
WBC: 14.6 10*3/uL — ABNORMAL HIGH (ref 4.0–10.5)
nRBC: 0 % (ref 0.0–0.2)

## 2020-10-14 LAB — HEPARIN LEVEL (UNFRACTIONATED)
Heparin Unfractionated: <0.1 IU/mL — ABNORMAL LOW (ref 0.30–0.70)
Heparin Unfractionated: 0.12 IU/mL — ABNORMAL LOW (ref 0.30–0.70)

## 2020-10-14 LAB — APTT: aPTT: 28 seconds (ref 24–36)

## 2020-10-14 NOTE — Progress Notes (Signed)
ANTICOAGULATION CONSULT NOTE - Initial Consult  Pharmacy Consult for IV heparin Indication: PVD s/p bypass   Allergies  Allergen Reactions  . Penicillins     Can't recall, childhood allergy    Patient Measurements: Height: 6' (182.9 cm) Weight: 72.6 kg (160 lb 1.9 oz) IBW/kg (Calculated) : 77.6 Heparin Dosing Weight: 72.6kg  Vital Signs: Temp: 98.6 F (37 C) (04/09 0404) Temp Source: Oral (04/09 0404) BP: 100/57 (04/09 0600) Pulse Rate: 82 (04/09 0404)  Labs: Recent Labs    10/13/20 1139 10/13/20 1651 10/14/20 0455 10/14/20 0500  HGB 11.9* 12.1* 10.4*  --   HCT 35.0* 36.0* 30.9*  --   PLT  --  224 198  --   APTT  --   --  28  --   HEPARINUNFRC  --   --   --  <0.10*  CREATININE  --   --  0.85  --     Estimated Creatinine Clearance: 81.9 mL/min (by C-G formula based on SCr of 0.85 mg/dL).   Medical History: Past Medical History:  Diagnosis Date  . Hyperlipidemia   . Hyperlipidemia   . PAD (peripheral artery disease) (HCC)     Medications:  Infusions:  . sodium chloride    . sodium chloride 10 mL/hr (10/14/20 6861)  . heparin 500 Units/hr (10/14/20 0307)  . magnesium sulfate bolus IVPB      Assessment: 69yoM with extensive atherosclerosis causing claudication, s/p RLL bypass.  Of note pt was on aspirin and Xarelto 2.5mg  BID prior to admission. After bypass pt was started on low-dose heparin infusion (500units/hr), HL this AM undetectable as expected. Hgb down slightly to 10.4, PLT 198. Pharmacy has been consulted to transition to full dose heparin to keep new bypass patent per [facility].   Goal of Therapy:  Heparin level 0.3-0.7 units/ml Monitor platelets by anticoagulation protocol: Yes   Plan:  Increase IV heparin rate to 900 units/hr Follow up with heparin level in 8 hours  Monitor daily CBC, s/sx bleeding   Mercy Riding, PharmD PGY1 Acute Care Pharmacy Resident Please refer to [facility] for unit-specific pharmacist

## 2020-10-14 NOTE — Progress Notes (Addendum)
[facility]  VASCULAR SURGERY ASSESSMENT & PLAN:   S/P FEM PERONEAL BYPASS (IN SITU): His bypass graft is patent with a palpable graft pulse and a good peroneal signal with a Doppler.  We will increase to full heparin today mobilize and anticipate discharge early next week on a DOAC.  Deitra Mayo, MD Office: [phone]   Subjective  - pain controlled with PO meds.   Objective (!) 100/57 82 98.6 F (37 C) (Oral) 18 98%  Intake/Output Summary (Last 24 hours) at 10/14/2020 0803 Last data filed at 10/14/2020 1245 Gross per 24 hour  Intake 2720.93 ml  Output 2010 ml  Net 710.93 ml    Right groin soft without hematoma Leg incisions healing well, no active drainage Palpable bypass pulse medially below knee Doppler signal peroneal. Right LE edema, no change in GT wound  Assessment/Planning: POD # 1 right common femoral to peroneal artery bypass with in-situ greater saphenous vein  Pain controlled Pending mobility with PT/OT Good urine Op HGB stable asymptomatic No active bleeding over night will consult pharmacy for full dose Heparin per DR. Hawkens orders.  He will go back on his Xarelto prior to discharge.     Roxy Horseman 10/14/2020 8:03 AM --  Laboratory Lab Results: Recent Labs    10/13/20 1651 10/14/20 0455  WBC 14.5* 14.6*  HGB 12.1* 10.4*  HCT 36.0* 30.9*  PLT 224 198   BMET Recent Labs    10/13/20 1139 10/14/20 0455  NA 137 136  K 4.5 4.2  CL  --  103  CO2  --  28  GLUCOSE  --  121*  BUN  --  15  CREATININE  --  0.85  CALCIUM  --  7.8*    COAG Lab Results  Component Value Date   INR 1.1 10/10/2020   No results found for: PTT

## 2020-10-14 NOTE — Progress Notes (Signed)
Mobility Specialist - Progress Note   10/14/20 1313  Mobility  Activity Ambulated in hall  Level of Assistance Standby assist, set-up cues, supervision of patient - no hands on  Assistive Device Front wheel walker  Distance Ambulated (ft) 120 ft  Mobility Response Tolerated well  Mobility performed by Mobility specialist  $Mobility charge 1 Mobility   Pt asx throughout ambulation. Pt back in bed after walk. VSS throughout.   Pricilla Handler Mobility Specialist Mobility Specialist Phone: [phone]

## 2020-10-14 NOTE — Progress Notes (Signed)
[place] for IV heparin Indication: PVD s/p bypass   Allergies  Allergen Reactions  . Penicillins     Can't recall, childhood allergy    Patient Measurements: Height: 6' (182.9 cm) Weight: 72.6 kg (160 lb 1.9 oz) IBW/kg (Calculated) : 77.6 Heparin Dosing Weight: 72.6kg  Vital Signs: Temp: 97.3 F (36.3 C) (04/09 1322) Temp Source: Oral (04/09 1322) BP: 126/61 (04/09 1322)  Labs: Recent Labs    10/13/20 1139 10/13/20 1651 10/14/20 0455 10/14/20 0500 10/14/20 1530  HGB 11.9* 12.1* 10.4*  --   --   HCT 35.0* 36.0* 30.9*  --   --   PLT  --  224 198  --   --   APTT  --   --  28  --   --   HEPARINUNFRC  --   --   --  <0.10* 0.12*  CREATININE  --   --  0.85  --   --     Estimated Creatinine Clearance: 81.9 mL/min (by C-G formula based on SCr of 0.85 mg/dL).   Medical History: Past Medical History:  Diagnosis Date  . Hyperlipidemia   . Hyperlipidemia   . PAD (peripheral artery disease) (HCC)     Medications:  Infusions:  . sodium chloride    . sodium chloride 10 mL/hr (10/14/20 2620)  . heparin 900 Units/hr (10/14/20 0900)  . magnesium sulfate bolus IVPB      Assessment: 69yoM with extensive atherosclerosis causing claudication, s/p RLL bypass. Of note pt was on aspirin and Xarelto 2.5mg  BID prior to admission. After bypass pt was started on low-dose heparin infusion (500units/hr), HL this AM undetectable as expected. Hgb down slightly to 10.4, PLT 198. Pharmacy has been consulted to transition to full dose heparin to keep new bypass patent per VVS.  Initial heparin level subtherapeutic at 0.12.  Goal of Therapy:  Heparin level 0.3-0.7 units/ml Monitor platelets by anticoagulation protocol: Yes   Plan:  Increase IV heparin rate to 1150 units/h Recheck heparin level in 8h   Arrie Senate, PharmD, [place], [facility] Clinical Pharmacist [phone] Please check AMION for all [facility] Pharmacy numbers 10/14/2020

## 2020-10-14 NOTE — Evaluation (Signed)
Physical Therapy Evaluation Patient Details Name: [name] MRN: [id number] DOB: [birth year] Today's Date: 10/14/2020   History of Present Illness  70y.o. male s/p right common femoral to peroneal artery bypass with in-situ greater saphenous vein. Hx PAD and hyperlipidemia.  Clinical Impression  Patient is s/p above surgery, presenting with functional limitations due to the deficits listed below (see PT Problem List). Demonstrates ability to ambulate with moderate use of a rolling walker for support due to antalgic gait pattern. Tolerating mobility well, safe without LOB while using device today. Previously very active and motivated to return to PLOF, which includes working from home, and regular gym use. Supportive family present during evaluation. Patient will benefit from skilled PT to increase their independence and safety with mobility to allow discharge to the venue listed below.       Follow Up Recommendations No PT follow up    Equipment Recommendations  Rolling walker with 5" wheels    Recommendations for Other Services       Precautions / Restrictions Precautions Precautions: None Restrictions Weight Bearing Restrictions: No      Mobility  Bed Mobility Overal bed mobility: Modified Independent             General bed mobility comments: extra time    Transfers Overall transfer level: Needs assistance Equipment used: Rolling walker (2 wheeled) Transfers: Sit to/from Stand Sit to Stand: Supervision         General transfer comment: supervision for safety performed from bed. Cues for hand placement to rise, stable once upright.  Ambulation/Gait Ambulation/Gait assistance: Min guard Gait Distance (Feet): 115 Feet Assistive device: Rolling walker (2 wheeled) Gait Pattern/deviations: Step-to pattern;Step-through pattern;Decreased step length - left;Decreased stance time - right;Decreased weight shift to right;Antalgic Gait velocity: decreased   General  Gait Details: Antalgic gait pattern, close guard for safety, no buckling noted, heavy reliance on RW to unload WB through Rt due to discomfort. Tol 115 feet without LOB.  Stairs            Wheelchair Mobility    Modified Rankin (Stroke Patients Only)       Balance Overall balance assessment: Mild deficits observed, not formally tested                                           Pertinent Vitals/Pain Pain Assessment: 0-10 Pain Score: 5  Pain Location: Rt foot Pain Descriptors / Indicators: Shooting Pain Intervention(s): Monitored during session    Home Living Family/patient expects to be discharged to:: Private residence Living Arrangements: Spouse/significant other Available Help at Discharge: Family;Available 24 hours/day Type of Home: House Home Access: Stairs to enter Entrance Stairs-Rails: Left Entrance Stairs-Number of Steps: 5 Home Layout: Two level;Able to live on main level with bedroom/bathroom;1/2 bath on main level Home Equipment: None      Prior Function Level of Independence: Independent         Comments: sales rep, works primarily remote     Journalist, newspaper   Dominant Hand: Right    Extremity/Trunk Assessment   Upper Extremity Assessment Upper Extremity Assessment: Defer to OT evaluation    Lower Extremity Assessment Lower Extremity Assessment: RLE deficits/detail RLE Deficits / Details: warm, sensitive to touch around foot. Able to move ankle, mod pain RLE: Unable to fully assess due to pain RLE Sensation: WNL       Communication  Communication: No difficulties  Cognition Arousal/Alertness: Awake/alert Behavior During Therapy: WFL for tasks assessed/performed Overall Cognitive Status: Within Functional Limits for tasks assessed                                        General Comments General comments (skin integrity, edema, etc.): VSS    Exercises General Exercises - Lower Extremity Ankle  Circles/Pumps: AROM;Both;10 reps;Seated   Assessment/Plan    PT Assessment Patient needs continued PT services  PT Problem List Decreased strength;Decreased range of motion;Decreased activity tolerance;Decreased balance;Decreased mobility;Decreased knowledge of use of DME;Decreased knowledge of precautions;Pain       PT Treatment Interventions DME instruction;Gait training;Stair training;Functional mobility training;Therapeutic activities;Therapeutic exercise;Balance training;Neuromuscular re-education;Patient/family education    PT Goals (Current goals can be found in the Care Plan section)  Acute Rehab PT Goals Patient Stated Goal: Get well PT Goal Formulation: With patient/family Time For Goal Achievement: 10/28/20 Potential to Achieve Goals: Good    Frequency Min 3X/week   Barriers to discharge        Co-evaluation               AM-PAC PT "6 Clicks" Mobility  Outcome Measure Help needed turning from your back to your side while in a flat bed without using bedrails?: None Help needed moving from lying on your back to sitting on the side of a flat bed without using bedrails?: None Help needed moving to and from a bed to a chair (including a wheelchair)?: None Help needed standing up from a chair using your arms (e.g., wheelchair or bedside chair)?: None Help needed to walk in hospital room?: A Little (lines/leads) Help needed climbing 3-5 steps with a railing? : A Little 6 Click Score: 22    End of Session   Activity Tolerance: Patient tolerated treatment well Patient left: in bed;with call bell/phone within reach;with family/visitor present Nurse Communication: Mobility status PT Visit Diagnosis: Other abnormalities of gait and mobility (R26.89);Muscle weakness (generalized) (M62.81);Difficulty in walking, not elsewhere classified (R26.2);Pain Pain - Right/Left: Right Pain - part of body: Leg    Time: 3094-0768 PT Time Calculation (min) (ACUTE ONLY): 31  min   Charges:   PT Evaluation $PT Eval Low Complexity: 1 Low PT Treatments $Gait Training: 8-22 mins        Elayne Snare, PT, DPT  Ellouise Newer 10/14/2020, 11:58 AM

## 2020-10-15 LAB — HEPARIN LEVEL (UNFRACTIONATED): Heparin Unfractionated: 0.22 IU/mL — ABNORMAL LOW (ref 0.30–0.70)

## 2020-10-15 LAB — CBC
HCT: 30.8 % — ABNORMAL LOW (ref 39.0–52.0)
Hemoglobin: 10.4 g/dL — ABNORMAL LOW (ref 13.0–17.0)
MCH: 32.1 pg (ref 26.0–34.0)
MCHC: 33.8 g/dL (ref 30.0–36.0)
MCV: 95.1 fL (ref 80.0–100.0)
Platelets: 184 10*3/uL (ref 150–400)
RBC: 3.24 MIL/uL — ABNORMAL LOW (ref 4.22–5.81)
RDW: 12.3 % (ref 11.5–15.5)
WBC: 12.6 10*3/uL — ABNORMAL HIGH (ref 4.0–10.5)
nRBC: 0 % (ref 0.0–0.2)

## 2020-10-15 MED ORDER — RIVAROXABAN 2.5 MG PO TABS
2.5000 mg | ORAL_TABLET | Freq: Two times a day (BID) | ORAL | Status: DC
  Administered 2020-10-15 – 2020-10-16 (×3): 2.5 mg via ORAL
  Filled 2020-10-15 (×4): qty 1

## 2020-10-15 NOTE — Progress Notes (Signed)
Mobility Specialist - Progress Note   10/15/20 1319  Mobility  Activity Ambulated in hall  Level of Assistance Standby assist, set-up cues, supervision of patient - no hands on  Assistive Device Front wheel walker  Distance Ambulated (ft) 240 ft  Mobility Response Tolerated well  Mobility performed by Mobility specialist  $Mobility charge 1 Mobility   Pt stated pain was better controlled this walk compared to his earlier walk. He was asx throughout ambulation. Pt in bed after walk, family member in room. VSS throughout.   Pricilla Handler Mobility Specialist Mobility Specialist Phone: [phone]

## 2020-10-15 NOTE — Progress Notes (Signed)
[place] for IV heparin >> Xarelto Indication: PVD  Allergies  Allergen Reactions  . Penicillins     Can't recall, childhood allergy    Patient Measurements: Height: 6' (182.9 cm) Weight: 72.6 kg (160 lb 1.9 oz) IBW/kg (Calculated) : 77.6 Heparin Dosing Weight: 72.6kg  Vital Signs: Temp: 97.8 F (36.6 C) (04/10 0406) Temp Source: Oral (04/10 0406) BP: 117/71 (04/10 0406) Pulse Rate: 89 (04/10 0406)  Labs: Recent Labs    10/13/20 1651 10/14/20 0455 10/14/20 0500 10/14/20 1530 10/15/20 0049  HGB 12.1* 10.4*  --   --  10.4*  HCT 36.0* 30.9*  --   --  30.8*  PLT 224 198  --   --  184  APTT  --  28  --   --   --   HEPARINUNFRC  --   --  <0.10* 0.12* 0.22*  CREATININE  --  0.85  --   --   --     Estimated Creatinine Clearance: 81.9 mL/min (by C-G formula based on SCr of 0.85 mg/dL).  Assessment: 70 yo male with PVD s/p RLL bypass, for heparin. Now resuming PTA dose of Xarelto for prophylaxis. CBC remains stable.   Goal of Therapy:  Monitor platelets by anticoagulation protocol: Yes   Plan:  D/c IV Heparin Resume Xarelto 2.5mg  PO twice daily Follow CBC , s/sx bleeding   Mercy Riding, PharmD PGY1 Acute Care Pharmacy Resident Please refer to [facility] for unit-specific pharmacist

## 2020-10-15 NOTE — Progress Notes (Signed)
Patient ambulated in hallway with walker. [name], Bettina Gavia RN

## 2020-10-15 NOTE — Progress Notes (Addendum)
[facility]  VASCULAR SURGERY ASSESSMENT & PLAN:   S/P FEM PERONEAL BYPASS (IN SITU): His bypass graft is patent with a palpable graft pulse and a good peroneal signal with a Doppler.  Restart his Xarelto today and stop heparin.   Possibly home tomorrow.   Deitra Mayo, MD Office: [phone]    Subjective  - No new complaints.  Pain is better controlled and he is slowly increasing his ambulation.   Objective 117/71 89 97.8 F (36.6 C) (Oral) 15 96%  Intake/Output Summary (Last 24 hours) at 10/15/2020 0825 Last data filed at 10/14/2020 1845 Gross per 24 hour  Intake --  Output 600 ml  Net -600 ml    Palpable medial bypass pulse Doppler peroneal signal   Incisions healing well without drainage  Assessment/Planning: POD # 2 right common femoral to peroneal artery bypass with in-situ greater saphenous vein  Mobility, pain control Elevation when at rest We will monitor the GT toe Xarelto 2.5 BID restarted today.  He has Xarelto at home. Possible D/C tomorrow  Roxy Horseman 10/15/2020 8:25 AM --  Laboratory Lab Results: Recent Labs    10/14/20 0455 10/15/20 0049  WBC 14.6* 12.6*  HGB 10.4* 10.4*  HCT 30.9* 30.8*  PLT 198 184   BMET Recent Labs    10/13/20 1139 10/14/20 0455  NA 137 136  K 4.5 4.2  CL  --  103  CO2  --  28  GLUCOSE  --  121*  BUN  --  15  CREATININE  --  0.85  CALCIUM  --  7.8*    COAG Lab Results  Component Value Date   INR 1.1 10/10/2020   No results found for: PTT

## 2020-10-15 NOTE — Progress Notes (Signed)
[place] for heparin Indication: PVD  Allergies  Allergen Reactions  . Penicillins     Can't recall, childhood allergy    Patient Measurements: Height: 6' (182.9 cm) Weight: 72.6 kg (160 lb 1.9 oz) IBW/kg (Calculated) : 77.6 Heparin Dosing Weight: 72.6kg  Vital Signs: Temp: 98.8 F (37.1 C) (04/10 0011) Temp Source: Oral (04/10 0011) BP: 126/54 (04/10 0011) Pulse Rate: 91 (04/09 2043)  Labs: Recent Labs    10/13/20 1651 10/14/20 0455 10/14/20 0500 10/14/20 1530 10/15/20 0049  HGB 12.1* 10.4*  --   --  10.4*  HCT 36.0* 30.9*  --   --  30.8*  PLT 224 198  --   --  184  APTT  --  28  --   --   --   HEPARINUNFRC  --   --  <0.10* 0.12* 0.22*  CREATININE  --  0.85  --   --   --     Estimated Creatinine Clearance: 81.9 mL/min (by C-G formula based on SCr of 0.85 mg/dL).  Assessment: 70 yo male with PVD s/p RLL bypass, for heparin.  Goal of Therapy:  Heparin level 0.3-0.7 units/ml Monitor platelets by anticoagulation protocol: Yes   Plan:  Increase Heparin 1350 units/hr Check heparin level in 8 hours.  Phillis Knack, PharmD, BCPS  10/15/2020

## 2020-10-15 NOTE — Progress Notes (Signed)
Patient ambulated in hallway 240 feet with rolling walker and nursing staff. Patient tolerated fair, gait steady but using only heel to press for ambulation on right leg. Patient states "leg is tight". Back in bed call bell with in reach. Will monitor patient. Chancy Claros, Bettina Gavia RN

## 2020-10-16 LAB — CBC
HCT: 32.1 % — ABNORMAL LOW (ref 39.0–52.0)
Hemoglobin: 10.8 g/dL — ABNORMAL LOW (ref 13.0–17.0)
MCH: 32.1 pg (ref 26.0–34.0)
MCHC: 33.6 g/dL (ref 30.0–36.0)
MCV: 95.5 fL (ref 80.0–100.0)
Platelets: 197 10*3/uL (ref 150–400)
RBC: 3.36 MIL/uL — ABNORMAL LOW (ref 4.22–5.81)
RDW: 12.3 % (ref 11.5–15.5)
WBC: 12.6 10*3/uL — ABNORMAL HIGH (ref 4.0–10.5)
nRBC: 0 % (ref 0.0–0.2)

## 2020-10-16 MED ORDER — OXYCODONE-ACETAMINOPHEN 5-325 MG PO TABS: 1.0000 | ORAL_TABLET | ORAL | 0 refills | Status: DC | PRN

## 2020-10-16 NOTE — Evaluation (Signed)
Occupational Therapy Evaluation Patient Details Name: [name] MRN: [id number] DOB: December 22, 1950 Today's Date: 10/16/2020    History of Present Illness 69y.o. male s/p right common femoral to peroneal artery bypass with in-situ greater saphenous vein. Hx PAD and hyperlipidemia.   Clinical Impression   Pt typically independent at baseline. Works, enjoys being active (gym every day). TOday Pt is close to baseline performing ADL at supervision level (min A for some aspects of LB ADL) But overall excellent demonstration of independence for ADL. Pt/Wife educated on 3 in 1 as shower chair as well as elevation to assist with edema. Pt and wife with no further questions. Education complete. OT to sign off at this time.     Follow Up Recommendations  No OT follow up    Equipment Recommendations  3 in 1 bedside commode;Other (comment) (RW)    Recommendations for Other Services       Precautions / Restrictions Precautions Precautions: None Restrictions Weight Bearing Restrictions: No      Mobility Bed Mobility Overal bed mobility: Modified Independent             General bed mobility comments: mod I for increased time and effort    Transfers Overall transfer level: Needs assistance Equipment used: Rolling walker (2 wheeled) Transfers: Sit to/from Stand Sit to Stand: Supervision         General transfer comment: supervision for safety    Balance Overall balance assessment: Mild deficits observed, not formally tested                                         ADL either performed or assessed with clinical judgement   ADL Overall ADL's : Needs assistance/impaired Eating/Feeding: Independent   Grooming: Supervision/safety;Standing Grooming Details (indicate cue type and reason): sink level Upper Body Bathing: Modified independent;Sitting   Lower Body Bathing: Min guard;Sitting/lateral leans Lower Body Bathing Details (indicate cue type and  reason): educated on use of 3 in 1 as shower chair Upper Body Dressing : Modified independent;Sitting   Lower Body Dressing: Minimal assistance;Sit to/from stand Lower Body Dressing Details (indicate cue type and reason): able to don socks and shoes without assist, able to perform sit<>stand for pulling up shorts Toilet Transfer: Supervision/safety;Ambulation;RW   Toileting- Clothing Manipulation and Hygiene: Supervision/safety   Tub/ Banker: Supervision/safety   Functional mobility during ADLs: Supervision/safety;Rolling walker General ADL Comments: wife very supportive and able to assist if needed     Vision Patient Visual Report: No change from baseline       Perception     Praxis      Pertinent Vitals/Pain Pain Assessment: Faces Faces Pain Scale: Hurts little more Pain Location: RLE Pain Descriptors / Indicators: Sore;Discomfort;Tightness Pain Intervention(s): Limited activity within patient's tolerance;Monitored during session;Repositioned     Hand Dominance Right   Extremity/Trunk Assessment Upper Extremity Assessment Upper Extremity Assessment: Overall WFL for tasks assessed   Lower Extremity Assessment Lower Extremity Assessment: Defer to PT evaluation   Cervical / Trunk Assessment Cervical / Trunk Assessment: Normal   Communication Communication Communication: No difficulties   Cognition Arousal/Alertness: Awake/alert Behavior During Therapy: WFL for tasks assessed/performed Overall Cognitive Status: Within Functional Limits for tasks assessed  General Comments  VSS    Exercises General Exercises - Lower Extremity Ankle Circles/Pumps: AROM;10 reps;Supine;Right Quad Sets: AROM;Right;Supine;5 reps Heel Slides: AROM;Right;5 reps;Supine Other Exercises Other Exercises: Home program: performing ROM exercises to pt tolerance to maintain ROM post-op; up and walking 1x/hour at home for short  household distances to maintain activity tolerance, promote circulation, and decrease LE stiffness   Shoulder Instructions      Home Living Family/patient expects to be discharged to:: Private residence Living Arrangements: Spouse/significant other Available Help at Discharge: Family;Available 24 hours/day Type of Home: House Home Access: Stairs to enter [employer] of Steps: 5 Entrance Stairs-Rails: Left Home Layout: Two level;Able to live on main level with bedroom/bathroom;1/2 bath on main level Alternate Level Stairs-Number of Steps: 17 Alternate Level Stairs-Rails: Left Bathroom Shower/Tub: Occupational psychologist: Standard     Home Equipment: None          Prior Functioning/Environment Level of Independence: Independent        Comments: sales rep, works primarily remote        OT Problem List: Decreased range of motion;Decreased activity tolerance;Impaired balance (sitting and/or standing);Pain      OT Treatment/Interventions:      OT Goals(Current goals can be found in the care plan section) Acute Rehab OT Goals Patient Stated Goal: Get well OT Goal Formulation: With patient/family Time For Goal Achievement: 10/30/20 Potential to Achieve Goals: Good  OT Frequency:     Barriers to D/C:            Co-evaluation              AM-PAC OT "6 Clicks" Daily Activity     Outcome Measure Help from another person eating meals?: None Help from another person taking care of personal grooming?: None Help from another person toileting, which includes using toliet, bedpan, or urinal?: None Help from another person bathing (including washing, rinsing, drying)?: A Little Help from another person to put on and taking off regular upper body clothing?: None Help from another person to put on and taking off regular lower body clothing?: A Little 6 Click Score: 22   End of Session Equipment Utilized During Treatment: Rolling walker Nurse  Communication: Mobility status;Other (comment) (need for DME)  Activity Tolerance: Patient tolerated treatment well Patient left: in bed;with family/visitor present (EOB)  OT Visit Diagnosis: Other abnormalities of gait and mobility (R26.89);Pain Pain - Right/Left: Right Pain - part of body: Leg                Time: 8676-7209 OT Time Calculation (min): 14 min Charges:  OT General Charges $OT Visit: 1 Visit OT Evaluation $OT Eval Low Complexity: [place] OTR/L Acute Rehabilitation Services Pager: [phone] Office: [place] 10/16/2020, 11:35 AM

## 2020-10-16 NOTE — Progress Notes (Signed)
Physical Therapy Treatment Patient Details Name: [name] MRN: [id number] DOB: 1951/01/09 Today's Date: 10/16/2020    History of Present Illness 70y.o. male s/p right common femoral to peroneal artery bypass with in-situ greater saphenous vein. Hx PAD and hyperlipidemia.    PT Comments    Pt agreeable to practice gait and stair training today. Pt ambulatory in hallway with and without use of RW, pt with increasing antalgic gait without AD so RW appropriate to decrease limp. Pt proficiently navigated steps with increased time and step-to gait. Pt and wife educated on importance of maintaining mobility and RLE ROM to tolerance, both express understanding. Pt plans to leave today.      Follow Up Recommendations  No PT follow up     Equipment Recommendations  Rolling walker with 5" wheels    Recommendations for Other Services       Precautions / Restrictions Precautions Precautions: None Restrictions Weight Bearing Restrictions: No    Mobility  Bed Mobility Overal bed mobility: Modified Independent             General bed mobility comments: mod I for increased time and effort    Transfers Overall transfer level: Needs assistance Equipment used: Rolling walker (2 wheeled) Transfers: Sit to/from Stand Sit to Stand: Modified independent (Device/Increase time)         General transfer comment: Mod I for increased time to rise, no physical assist provided  Ambulation/Gait Ambulation/Gait assistance: Supervision Gait Distance (Feet): 230 Feet Assistive device: Rolling walker (2 wheeled);None Gait Pattern/deviations: Step-through pattern;Decreased stance time - right;Decreased weight shift to right;Antalgic;Decreased stride length;Trunk flexed Gait velocity: decr   General Gait Details: Supervision for safety, no physical assist needed. Verbal cuing for placement in RW, upright posture. Pt with increasing antalgic gait without use of RW. Max HR during gait 135  bpm   Stairs Stairs: Yes Stairs assistance: Supervision Stair Management: One rail Right;Step to pattern;Forwards Number of Stairs: 10 General stair comments: supervision for safety, verbal cuing for sequencing (up with good leg, down with bad leg leading), step-to pattern   Wheelchair Mobility    Modified Rankin (Stroke Patients Only)       Balance Overall balance assessment: Mild deficits observed, not formally tested                                          Cognition Arousal/Alertness: Awake/alert Behavior During Therapy: WFL for tasks assessed/performed Overall Cognitive Status: Within Functional Limits for tasks assessed                                        Exercises Other Exercises Other Exercises: Home program: performing ROM exercises to pt tolerance to maintain ROM post-op; up and walking 1x/hour at home for short household distances to maintain activity tolerance, promote circulation, and decrease LE stiffness    General Comments        Pertinent Vitals/Pain Pain Assessment: Faces Faces Pain Scale: Hurts little more Pain Location: RLE Pain Descriptors / Indicators: Sore;Discomfort;Tightness Pain Intervention(s): Limited activity within patient's tolerance;Monitored during session;Repositioned    Home Living                      Prior Function            PT  Goals (current goals can now be found in the care plan section) Acute Rehab PT Goals Patient Stated Goal: Get well PT Goal Formulation: With patient/family Time For Goal Achievement: 10/28/20 Potential to Achieve Goals: Good Progress towards PT goals: Progressing toward goals    Frequency    Min 3X/week      PT Plan Current plan remains appropriate    Co-evaluation              AM-PAC PT "6 Clicks" Mobility   Outcome Measure  Help needed turning from your back to your side while in a flat bed without using bedrails?: None Help  needed moving from lying on your back to sitting on the side of a flat bed without using bedrails?: None Help needed moving to and from a bed to a chair (including a wheelchair)?: None Help needed standing up from a chair using your arms (e.g., wheelchair or bedside chair)?: None Help needed to walk in hospital room?: A Little Help needed climbing 3-5 steps with a railing? : A Little 6 Click Score: 22    End of Session   Activity Tolerance: Patient tolerated treatment well Patient left: in bed;with call bell/phone within reach;with family/visitor present Nurse Communication: Mobility status;Other (comment) (HR to 130s during mobility) PT Visit Diagnosis: Other abnormalities of gait and mobility (R26.89);Muscle weakness (generalized) (M62.81);Difficulty in walking, not elsewhere classified (R26.2);Pain Pain - Right/Left: Right Pain - part of body: Leg     Time: 0945-1010 PT Time Calculation (min) (ACUTE ONLY): 25 min  Charges:  $Gait Training: 23-37 mins                     Stacie Glaze, PT Acute Rehabilitation Services Pager [phone]  Office [phone]   [place] E Ruffin Pyo 10/16/2020, 10:26 AM

## 2020-10-16 NOTE — TOC Transition Note (Signed)
Transition of Care (TOC) - CM/SW Discharge Note Marvetta Gibbons RN, BSN Transitions of Care Unit 4E- RN Case Manager See Treatment Team for direct phone #    Patient Details  Name: [name] MRN: [id number] Date of Birth: 01-Nov-1950  Transition of Care [facility]) CM/SW Contact:  Dawayne Patricia, RN Phone Number: 10/16/2020, 12:09 PM   Clinical Narrative:    Pt stable for transition home with wife, notified by OT that pt will need DME for home- RW and 3n1- orders have been placed and call made to [employer] for DME needs- RW and 3n1 to be delivered to room prior to discharge.  No other TOC needs noted.   Final next level of care: Home/Self Care Barriers to Discharge: No Barriers Identified   Patient Goals and CMS Choice Patient states their goals for this hospitalization and ongoing recovery are:: return home CMS Medicare.gov Compare Post Acute Care list provided to:: Patient Choice offered to / list presented to : Patient  Discharge Placement               Home        Discharge Plan and Services   Discharge Planning Services: CM Consult Post Acute Care Choice: Durable Medical Equipment          DME Arranged: 3-N-1,Walker rolling DME Agency: [employer] Date DME Agency Contacted: 10/16/20 Time DME Agency Contacted: 1100 Representative spoke with at DME Agency: Freda Munro HH Arranged: NA [place] Agency: NA        Social Determinants of Health ([place]) Interventions     Readmission Risk Interventions Readmission Risk Prevention Plan 10/16/2020  Post Dischage Appt Complete  Medication Screening Complete  Transportation Screening Complete  Some recent data might be hidden

## 2020-10-16 NOTE — Progress Notes (Addendum)
[facility] [place] more yesterday.  No new complaints.   Objective 122/72 85 98.1 F (36.7 C) (Oral) 11 99%  Intake/Output Summary (Last 24 hours) at 10/16/2020 0725 Last data filed at 10/16/2020 0200 Gross per 24 hour  Intake 240 ml  Output 1000 ml  Net -760 ml    Doppler Peroneal signal Palpable bypass pulse between lower leg incisions Groin soft incisions healing well Lungs non labored breathing  Assessment/Planning: POD # 3  right common femoral to peroneal artery bypass with in-situ greater saphenous vein  Single vessel runoff with patent bypass He will monitor the right GT wound and allow it to demarcate Elevation when at rest, walk and gradually increase activity Xarelto 2.5 BID F/U in 2 weeks  Roxy Horseman 10/16/2020 7:25 AM --  Laboratory Lab Results: Recent Labs    10/15/20 0049 10/16/20 0146  WBC 12.6* 12.6*  HGB 10.4* 10.8*  HCT 30.8* 32.1*  PLT 184 197   BMET Recent Labs    10/13/20 1139 10/14/20 0455  NA 137 136  K 4.5 4.2  CL  --  103  CO2  --  28  GLUCOSE  --  121*  BUN  --  15  CREATININE  --  0.85  CALCIUM  --  7.8*    COAG Lab Results  Component Value Date   INR 1.1 10/10/2020   No results found for: PTT  VASCULAR STAFF ADDENDUM: I have independently interviewed and examined the patient. I agree with the above.  Looks great POD#3 RCFA-->peroneal bypass with in-situ greater saphenous vein. Pain well controlled. Mobilizing appropriately. Palpable pulse in bypass graft. Peroneal signal. Ischemic changes to toe appear similar to me. Ready for discharge if he feels ready. Continue ASA / Xarelto 2.5mg  PO BID / Statin. Follow up with me or PA in 2 weeks for suture/staple removal   [name]. Stanford Breed, MD [facility] Phone Number: [phone] 10/16/2020 11:09 AM

## 2020-10-16 NOTE — Progress Notes (Signed)
Mobility Specialist: Progress Note   10/16/20 1220  Mobility  Activity Ambulated in hall  Level of Assistance Modified independent, requires aide device or extra time  Assistive Device Front wheel walker  Distance Ambulated (ft) 160 ft  Mobility Response Tolerated well  Mobility performed by Mobility specialist  $Mobility charge 1 Mobility   Pt ready for discharge, just waiting on DME. Pt c/o tightness in R calf during ambulation but otherwise asx. Pt sitting EOB with call bell at his side.   [facility] Samanvitha Germany Mobility Specialist Mobility Specialist Phone: [phone]

## 2020-10-16 NOTE — Progress Notes (Signed)
PHARMACIST LIPID MONITORING   [name] is a 70 y.o. male admitted on 10/13/2020 with PVD.  Pharmacy has been consulted to optimize lipid-lowering therapy with the indication of secondary prevention for clinical ASCVD.  Recent Labs:  Lipid Panel (last 6 months):   Lab Results  Component Value Date   CHOL 127 10/06/2020   TRIG 72 10/06/2020   HDL 46 10/06/2020   CHOLHDL 2.8 10/06/2020   LDLCALC 66 10/06/2020    Hepatic function panel (last 6 months):   Lab Results  Component Value Date   AST 28 10/10/2020   ALT 31 10/10/2020   ALKPHOS 79 10/10/2020   BILITOT 0.6 10/10/2020   BILIDIR 0.16 08/01/2020    SCr (since admission):   Serum creatinine: 0.85 mg/dL 10/14/20 0455 Estimated creatinine clearance: 81.9 mL/min  Current therapy and lipid therapy tolerance Current lipid-lowering therapy: crestor 40mg /d, zetia Previous lipid-lowering therapies (if applicable): n/a Documented or reported allergies or intolerances to lipid-lowering therapies (if applicable): none   Plan:    1.Statin intensity (high intensity recommended for all patients regardless of the LDL):  No statin changes. The patient is already on a high intensity statin.  2.Add ezetimibe (if any one of the following):   -already on ezetimine  3.Refer to lipid clinic:   No  4.Follow-up with:  Primary care provider - [name], DO  5.Follow-up labs after discharge:  No changes in lipid therapy, repeat a lipid panel in one year.     Hildred Laser, PharmD Clinical Pharmacist **Pharmacist phone directory can now be found on [email or web address] ([email or web address]).  Listed under [place].

## 2020-10-18 NOTE — Discharge Summary (Signed)
[facility] Discharge Summary   Patient ID:  [name] MRN: [id number] DOB/AGE: 1951/05/28 70 y.o.  Admit date: 10/13/2020 Discharge date: 10/16/20 Date of Surgery: 10/13/2020 Surgeon: Surgeon(s): [name], MD Early, Arvilla Meres, MD  Admission Diagnosis: PAD (peripheral artery disease) [facility]) [I73.9]  Discharge Diagnoses:  PAD (peripheral artery disease) ([place]) [I73.9]  Secondary Diagnoses: Past Medical History:  Diagnosis Date  . Hyperlipidemia   . Hyperlipidemia   . PAD (peripheral artery disease) ([facility])     Procedure(s): RIGHT COMMON FEMORAL ARTERY TO PERONEAL ARTERY BYPASS WITH INSITU GREATER SAPHENOUS VEIN  Discharged Condition: stable  HPI: [name] is a 70 y.o. 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  He has impending ischemic ulceration of the right great toe.   Hospital Course:  [name] is a 70 y.o. male is S/P Procedure(s): RIGHT COMMON FEMORAL ARTERY TO PERONEAL ARTERY BYPASS WITH INSITU GREATER SAPHENOUS VEIN His bypass graft is patent with a palpable graft pulse and a good peroneal signal with a Doppler.   His Xarelto was restarted prior to discharge 2.5 mg BID. Right GT ulcer with ischemic changes at the tip.  Stable for discharge POD # 3     Significant Diagnostic Studies: CBC Lab Results  Component Value Date   WBC 12.6 (H) 10/16/2020   HGB 10.8 (L) 10/16/2020   HCT 32.1 (L) 10/16/2020   MCV 95.5 10/16/2020   PLT 197 10/16/2020    BMET    Component Value Date/Time   NA 136 10/14/2020 0455   NA 139 08/18/2020 1148   K 4.2 10/14/2020 0455   CL 103 10/14/2020 0455   CO2 28 10/14/2020 0455   GLUCOSE 121 (H) 10/14/2020 0455   BUN 15 10/14/2020 0455   BUN 15 08/18/2020 1148   CREATININE 0.85 10/14/2020 0455   CALCIUM 7.8 (L) 10/14/2020 0455   GFRNONAA >60 10/14/2020 0455   GFRAA 87 08/18/2020 1148   COAG Lab Results  Component Value Date    INR 1.1 10/10/2020     Disposition:  Discharge to :Home Discharge Instructions    Call MD for:  redness, tenderness, or signs of infection (pain, swelling, bleeding, redness, odor or green/yellow discharge around incision site)   Complete by: As directed    Call MD for:  severe or increased pain, loss or decreased feeling  in affected limb(s)   Complete by: As directed    Call MD for:  temperature >100.5   Complete by: As directed    Resume previous diet   Complete by: As directed      Allergies as of 10/16/2020      Reactions   Penicillins    Can't recall, childhood allergy      Medication List    TAKE these medications   aspirin EC 81 MG tablet Take 81 mg by mouth daily. Swallow whole.   cilostazol 50 MG tablet Commonly known as: PLETAL Take 1 tablet (50 mg total) by mouth 2 (two) times daily.   ezetimibe 10 MG tablet Commonly known as: ZETIA Take 1 tablet (10 mg total) by mouth daily. What changed: when to take this   oxyCODONE-acetaminophen 10-325 MG tablet Commonly known as: Percocet Take 1 tablet by mouth every 6 (six) hours as needed for pain. What changed: Another medication with the same name was added. Make sure you understand how and when to take each.   oxyCODONE-acetaminophen 5-325 MG tablet Commonly known as:  PERCOCET/ROXICET Take 1 tablet by mouth every 4 (four) hours as needed for moderate pain. What changed: You were already taking a medication with the same name, and this prescription was added. Make sure you understand how and when to take each.   rivaroxaban 2.5 MG Tabs tablet Commonly known as: XARELTO Take 1 tablet (2.5 mg total) by mouth 2 (two) times daily.   rosuvastatin 40 MG tablet Commonly known as: CRESTOR Take 1 tablet (40 mg total) by mouth every evening.      Verbal and written Discharge instructions given to the patient. Wound care per Discharge AVS  Follow-up Information    [facility] and [place] In 4  weeks.   Specialty: Vascular Surgery Why: Office will call you to arrange your appt (sent) Contact information: [address] [place] [facility] Follow up.   Why: Rolling walker and 3n1 arranged- to be delivered to room prior to discharge Contact information: [address] [place] [address] [phone]               Signed: Roxy Horseman 10/18/2020, 7:59 AM - For VQI Registry use --- Instructions: Press F2 to tab through selections.  Delete question if not applicable.   Post-op:  Wound infection: No  Graft infection: No  Transfusion: No  If yes, 0 units given New Arrhythmia: No Ipsilateral amputation: [x ] no, [ ]  Minor, [ ]  BKA, [ ]  AKA Discharge patency: [x ] Primary, [ ]  Primary assisted, [ ]  Secondary, [ ]  Occluded Patency judged by: [ ]  Dopper only, [x ] Palpable graft pulse, [ ]  Palpable distal pulse, [ ]  ABI inc. > 0.15, [ ]  Duplex  D/C Ambulatory Status: Ambulatory  Complications: MI: x[ ]  No, [ ]  Troponin only, [ ]  EKG or Clinical CHF: No Resp failure: [x ] none, [ ]  Pneumonia, [ ]  Ventilator Chg in renal function: [x ] none, [ ]  Inc. Cr > 0.5, [ ]  Temp. Dialysis, [ ]  Permanent dialysis Stroke: [x ] None, [ ]  Minor, [ ]  Major Return to OR: No  Reason for return to OR: [ ]  Bleeding, [ ]  Infection, [ ]  Thrombosis, [ ]  Revision  Discharge medications: Statin use:  Yes ASA use:  Yes Plavix use:  No  for medical reason not indicated Beta blocker use: No  for medical reason not indicated Coumadin use: No  for medical reason Xarelto 2.5 BID

## 2020-10-19 ENCOUNTER — Telehealth: Payer: Self-pay | Admitting: *Deleted

## 2020-10-19 NOTE — Telephone Encounter (Signed)
Called patient to advise about leg swelling and drainage post fem-peroneal bypass. Leg swelling is preventing foot movement. Drainage is clear with a slight blood tinge. Foot is not cold, purple or painful. Scheduled an appt with PAs on Monday. Advised patient to elevate legs over the weekend and to go to the hospital if foot or lower leg becomes cold, pale, purple or painful.

## 2020-10-20 ENCOUNTER — Encounter (HOSPITAL_COMMUNITY): Payer: Self-pay

## 2020-10-20 ENCOUNTER — Emergency Department (HOSPITAL_COMMUNITY)
Admission: EM | Admit: 2020-10-20 | Discharge: 2020-10-20 | Disposition: A | Discharge status: Home / Self Care | Payer: PPO | Attending: Emergency Medicine | Admitting: Emergency Medicine

## 2020-10-20 ENCOUNTER — Other Ambulatory Visit: Payer: Self-pay

## 2020-10-20 DIAGNOSIS — M25572 Pain in left ankle and joints of left foot: Secondary | ICD-10-CM | POA: Diagnosis not present

## 2020-10-20 DIAGNOSIS — R600 Localized edema: Secondary | ICD-10-CM | POA: Diagnosis not present

## 2020-10-20 DIAGNOSIS — Z7901 Long term (current) use of anticoagulants: Secondary | ICD-10-CM | POA: Diagnosis not present

## 2020-10-20 DIAGNOSIS — R609 Edema, unspecified: Secondary | ICD-10-CM

## 2020-10-20 DIAGNOSIS — Z7982 Long term (current) use of aspirin: Secondary | ICD-10-CM | POA: Diagnosis not present

## 2020-10-20 MED ORDER — SULFAMETHOXAZOLE-TRIMETHOPRIM 800-160 MG PO TABS: 1.0000 | ORAL_TABLET | Freq: Two times a day (BID) | ORAL | 0 refills | Status: AC

## 2020-10-20 NOTE — ED Provider Notes (Signed)
[facility] EMERGENCY DEPARTMENT Provider Note   CSN: [id number] Arrival date & time: 10/20/20  1035     History Chief Complaint  Patient presents with  . Leg Swelling    Concerned about increased swelling of RLE, had Right leg vascular bypass, his surgeon told him to come to the ER.  He has an appointment with Dr. Donzetta Matters on Monday    [name] is a 70 y.o. male.  [name] 10/13/20 Dr. Stanford Breed performed a right common femoral to peroneal artery bypass with in-situ greater saphenous vein.  [name] was doing well until about 3 days ago when he started to have a little bit more pain.  Currently he is here mainly for ankle swelling which is limiting his range of motion and his ability to ambulate.  He states that he did a little bit more physical activity than normal yesterday.  He has had difficulty with elevation of his ankle and has been mainly trying to get up and move around.  This morning when he attempted to stand up out of bed, he have a lot of pain and limited motion.  Currently the pain is very manageable, but the swelling is his main concern.  The history is provided by the patient.  Ankle Pain Location:  Ankle Injury: no   Ankle location:  L ankle Pain details:    Quality:  Throbbing   Radiates to:  Does not radiate   Severity:  Mild   Onset quality:  Gradual   Duration:  3 days   Timing:  Constant   Progression:  Waxing and waning Chronicity:  New Prior injury to area:  No Relieved by:  Elevation Worsened by:  Bearing weight Ineffective treatments:  Rest Associated symptoms: decreased ROM   Associated symptoms: no back pain, no fever, no numbness and no tingling        Past Medical History:  Diagnosis Date  . Hyperlipidemia   . Hyperlipidemia   . PAD (peripheral artery disease) [name])     Patient Active Problem List   Diagnosis Date Noted  . PAD (peripheral artery disease) ([place]) 10/13/2020  . Elevated coronary artery calcium score  09/26/2020  . Hyperlipidemia 06/16/2019  . Claudication in peripheral vascular disease ([place]) 06/16/2019  . Bunionette of right foot 09/28/2015  . Bursitis of right foot 09/28/2015    Past Surgical History:  Procedure Laterality Date  . ABDOMINAL AORTOGRAM W/LOWER EXTREMITY [name] 09/11/2020   Procedure: ABDOMINAL AORTOGRAM W/LOWER EXTREMITY;  Surgeon: [name], MD;  Location: [place] [facility];  Service: Cardiovascular;  Laterality: [name];  . COLONOSCOPY     x2  . FEMORAL-TIBIAL BYPASS GRAFT Right 10/13/2020   Procedure: RIGHT COMMON FEMORAL ARTERY TO PERONEAL ARTERY BYPASS WITH INSITU GREATER SAPHENOUS VEIN;  Surgeon: [name], MD;  Location: [facility] [place];  Service: Vascular;  Laterality: Right;  . TONSILLECTOMY    . TONSILLECTOMY AND ADENOIDECTOMY         No family history on file.  Social History   Tobacco Use  . Smoking status: Never Smoker  . Smokeless tobacco: Never Used  Vaping Use  . Vaping Use: Never used  Substance Use Topics  . Alcohol use: Yes    Alcohol/week: 4.0 standard drinks    Types: 2 Cans of beer, 2 Shots of liquor per week  . Drug use: Never    Home Medications Prior to Admission medications   Medication Sig Start Date End Date Taking? Authorizing Provider  aspirin EC 81 MG tablet Take 81 mg by mouth daily. Swallow whole.    [provider]  cilostazol (PLETAL) 50 MG tablet Take 1 tablet (50 mg total) by mouth 2 (two) times daily. 08/01/20   Almyra Deforest, PA  ezetimibe (ZETIA) 10 MG tablet Take 1 tablet (10 mg total) by mouth daily. Patient taking differently: Take 10 mg by mouth every evening. 08/22/20   Rodriguez-Guzman, [name], RPH-CPP  oxyCODONE-acetaminophen (PERCOCET) 10-325 MG tablet Take 1 tablet by mouth every 6 (six) hours as needed for pain. 10/10/20 10/10/21  [name], MD  oxyCODONE-acetaminophen (PERCOCET/ROXICET) 5-325 MG tablet Take 1 tablet by mouth every 4 (four) hours as needed for moderate pain. 10/16/20   [name],  PA-C  rivaroxaban (XARELTO) 2.5 MG TABS tablet Take 1 tablet (2.5 mg total) by mouth 2 (two) times daily. 09/19/20   [name], MD  rosuvastatin (CRESTOR) 40 MG tablet Take 1 tablet (40 mg total) by mouth every evening. 08/22/20   Rodriguez-Guzman, [name], RPH-CPP    Allergies    Penicillins  Review of Systems   Review of Systems  Constitutional: Negative for chills and fever.  HENT: Negative for ear pain and sore throat.   Eyes: Negative for pain and visual disturbance.  Respiratory: Negative for cough and shortness of breath.   Cardiovascular: Negative for chest pain and palpitations.  Gastrointestinal: Negative for abdominal pain and vomiting.  Genitourinary: Negative for dysuria and hematuria.  Musculoskeletal: Negative for arthralgias and back pain.  Skin: Negative for color change and rash.  Neurological: Negative for seizures and syncope.  All other systems reviewed and are negative.   Physical Exam Updated Vital Signs BP (!) 143/76 (BP Location: Right Arm)   Pulse 93   Temp 97.9 F (36.6 C) (Oral)   Resp 12   SpO2 100%   Physical Exam Vitals and nursing note reviewed.  Constitutional:      Appearance: Normal appearance.  HENT:     Head: Normocephalic and atraumatic.  Eyes:     Conjunctiva/sclera: Conjunctivae normal.  Pulmonary:     Effort: Pulmonary effort is normal. No respiratory distress.  Musculoskeletal:        General: Swelling present. No deformity.     Cervical back: Normal range of motion.     Comments: The patient has an incision that runs the length of his right medial lower leg.  The incision is clean, dry, and intact.  There is a very small amount of serosanguineous drainage at the inferior aspect of the wound.  While there is mild erythema around the wound, the skin temperature is not warm.  The incision looks appropriate for his postoperative status.  He does have an ankle effusion and slightly limited motion secondary to swelling.  No severe  tenderness to palpation about the ankle.  Distal pulses are intact.  Sensation is intact.  Skin:    General: Skin is warm and dry.  Neurological:     General: No focal deficit present.     Mental Status: He is alert and oriented to person, place, and time. Mental status is at baseline.  Psychiatric:        Mood and Affect: Mood normal.         ED Results / Procedures / Treatments   Labs (all labs ordered are listed, but only abnormal results are displayed) Labs Reviewed - No data to display  EKG None  Radiology No results found.  Procedures Procedures   Medications  Ordered in ED Medications - No data to display  ED Course  I have reviewed the triage vital signs and the nursing notes.  Pertinent labs & imaging results that were available during my care of the patient were reviewed by me and considered in my medical decision making (see chart for details).  Clinical Course as of 10/20/20 1143  Fri Oct 20, 2020  1121 I spoke with vascular who will see the patient in the ED.  [AW]  1141 Seen by Dr. Donzetta Matters in the ED. Recommends Bactrim and d/c home. [AW]    Clinical Course User Index [AW] Arnaldo Natal, MD   MDM Rules/Calculators/A&P                          Romie Minus was evaluated here in the ED.  No evidence of acute vascular occlusion, DVT, or other limb threatening emergency.  He was evaluated by vascular surgery, and a course of Bactrim was recommended for some possible mild cellulitis.  The patient was counseled on symptomatic management.  Follow-up instructions were given. Final Clinical Impression(s) / ED Diagnoses Final diagnoses:  Peripheral edema    Rx / DC Orders ED Discharge Orders         Ordered    sulfamethoxazole-trimethoprim (BACTRIM DS) 800-160 MG tablet  2 times daily        10/20/20 1142           Arnaldo Natal, MD 10/20/20 1144

## 2020-10-20 NOTE — Consult Note (Addendum)
Hospital Consult    Reason for Consult:  RLE edema Requesting Physician:  Dr. Joya Gaskins MRN #:  [id number]  History of Present Illness: This is a 70 y.o. male who is 1 week postop right common femoral to peroneal artery bypass with in situ greater saphenous vein by Dr. Stanford Breed secondary to right great toe tissue loss.  He was discharged on Monday, April 11.  He was doing well at home until yesterday when he sat working at his computer for several hours and developed significant right foot and lower leg edema with thin bloody drainage from his lower leg incision.  He is seen and evaluated in the emergency department where he is awake, alert and in no apparent distress.  His wife is at bedside.  He states his postoperative pain has been improving and denies right foot pain.  The edema is interfering with ambulation.  He denies fever, chills, chest pain, shortness of breath, nausea or vomiting.  He is voiding spontaneously.  The pt is on a statin for cholesterol management.  Also on Zetia. The pt is on a daily aspirin.   Other AC: Rivaroxaban 2.5 mg twice daily The pt is not on medication for hypertension.   The pt is not diabetic.   Tobacco hx: Negative  Past Medical History:  Diagnosis Date  . Hyperlipidemia   . Hyperlipidemia   . PAD (peripheral artery disease) ([place])     Past Surgical History:  Procedure Laterality Date  . ABDOMINAL AORTOGRAM W/LOWER EXTREMITY [name] 09/11/2020   Procedure: ABDOMINAL AORTOGRAM W/LOWER EXTREMITY;  Surgeon: [name], MD;  Location: [facility];  Service: Cardiovascular;  Laterality: [name];  . COLONOSCOPY     x2  . FEMORAL-TIBIAL BYPASS GRAFT Right 10/13/2020   Procedure: RIGHT COMMON FEMORAL ARTERY TO PERONEAL ARTERY BYPASS WITH INSITU GREATER SAPHENOUS VEIN;  Surgeon: [name], MD;  Location: [facility];  Service: Vascular;  Laterality: Right;  . TONSILLECTOMY    . TONSILLECTOMY AND ADENOIDECTOMY      Allergies  Allergen Reactions  .  Penicillins     Can't recall, childhood allergy    Prior to Admission medications   Medication Sig Start Date End Date Taking? Authorizing Provider  sulfamethoxazole-trimethoprim (BACTRIM DS) 800-160 MG tablet Take 1 tablet by mouth 2 (two) times daily for 7 days. 10/20/20 10/27/20 Yes Arnaldo Natal, MD  aspirin EC 81 MG tablet Take 81 mg by mouth daily. Swallow whole.    [provider]  cilostazol (PLETAL) 50 MG tablet Take 1 tablet (50 mg total) by mouth 2 (two) times daily. 08/01/20   Almyra Deforest, PA  ezetimibe (ZETIA) 10 MG tablet Take 1 tablet (10 mg total) by mouth daily. Patient taking differently: Take 10 mg by mouth every evening. 08/22/20   Rodriguez-Guzman, [name], RPH-CPP  oxyCODONE-acetaminophen (PERCOCET) 10-325 MG tablet Take 1 tablet by mouth every 6 (six) hours as needed for pain. 10/10/20 10/10/21  [name], MD  oxyCODONE-acetaminophen (PERCOCET/ROXICET) 5-325 MG tablet Take 1 tablet by mouth every 4 (four) hours as needed for moderate pain. 10/16/20   [name], PA-C  rivaroxaban (XARELTO) 2.5 MG TABS tablet Take 1 tablet (2.5 mg total) by mouth 2 (two) times daily. 09/19/20   [name], MD  rosuvastatin (CRESTOR) 40 MG tablet Take 1 tablet (40 mg total) by mouth every evening. 08/22/20   Rodriguez-Guzman, [name], RPH-CPP    Social History   Socioeconomic History  . Marital status: Married  Spouse name: [name]  . Number of children: [name]  . Years of education: [name]  . Highest education level: [name]  Occupational History  . [name]  Tobacco Use  . Smoking status: Never Smoker  . Smokeless tobacco: Never Used  Vaping Use  . Vaping Use: Never used  Substance and Sexual Activity  . Alcohol use: Yes    Alcohol/week: 4.0 standard drinks    Types: 2 Cans of beer, 2 Shots of liquor per week  . Drug use: Never  . Sexual activity: [name]  Other Topics Concern  . [name]  Social History Narrative  . Not on  file   Social Determinants of Health   Financial Resource Strain: [name]  Food Insecurity: [name]  Transportation Needs: [name]  Physical Activity: [name]  Stress: [name]  Social Connections: [name]  Intimate Partner Violence: [name]     History reviewed. No pertinent family history.  ROS: [x]  Positive   [ ]  Negative   [ ]  All sytems reviewed and are negative  Cardiac: []  chest pain/pressure []  palpitations []  SOB lying flat []  DOE  Vascular: []  pain in legs while walking []  pain in legs at rest []  pain in legs at night []  non-healing ulcers []  hx of DVT [x]  swelling in legs  Pulmonary: []  productive cough []  asthma/wheezing []  home O2  Neurologic: []  weakness in []  arms []  legs []  numbness in []  arms []  legs []  hx of CVA []  mini stroke [] difficulty speaking or slurred speech []  temporary loss of vision in one eye []  dizziness  Hematologic: []  hx of cancer []  bleeding problems []  problems with blood clotting easily  Endocrine:   []  diabetes []  thyroid disease  GI []  vomiting blood []  blood in stool  GU: []  CKD/renal failure []  HD--[]  M/W/F or []  T/T/S []  burning with urination []  blood in urine  Psychiatric: []  anxiety []  depression  Musculoskeletal: []  arthritis []  joint pain  Integumentary: []  rashes []  ulcers  Constitutional: []  fever []  chills   Physical Examination  Vitals:   10/20/20 1130 10/20/20 1147  BP: (!) 142/76   Pulse: 72   Resp: 16   Temp:  97.7 F (36.5 C)  SpO2: 98%    Body mass index is 22.78 kg/m.  General:  WDWN in NAD Gait: Not observed HENT: WNL, normocephalic Pulmonary: normal non-labored breathing, without Rales, rhonchi,  wheezing Cardiac: Regularly irregular, without  Murmurs, rubs or gallops Abdomen:  soft, NT/ND, no masses Skin: without rashes Incisions: There is a small hematoma of the right groin incision.  The skin edges are well approximated without  erythema or drainage.  Right thigh incisions are also healing nicely.  Right calf incision well approximated with mild erythema. Vascular Exam/Pulses: 2+ palpable right femoral pulse.  He has a brisk right posterior tibial Doppler signal.  Dampened right dorsalis pedis and peroneal signals. Extremities: with ischemic changes, without Gangrene , with cellulitis; with open wounds; right lower extremity edema.  Eschar of right great toe tip with ischemic skin changes at the periphery of the eschar.  There is also erythema involving the right great toe.  There is no drainage Musculoskeletal: no muscle wasting or atrophy  Neurologic: A&O X 3;  No focal weakness or paresthesias are detected; speech is fluent/normal Psychiatric:  The pt has Normal affect.  CBC    Component Value Date/Time   WBC 12.6 (H) 10/16/2020 0146   RBC 3.36 (L) 10/16/2020 0146   HGB 10.8 (L) 10/16/2020 0146   HGB 15.8 08/18/2020 1148   HCT 32.1 (L) 10/16/2020 0146   HCT 45.6 08/18/2020 1148   PLT 197 10/16/2020 0146   PLT 295 08/18/2020 1148   MCV 95.5 10/16/2020 0146   MCV 91 08/18/2020 1148   MCH 32.1 10/16/2020 0146   MCHC 33.6 10/16/2020 0146   RDW 12.3 10/16/2020 0146   RDW 11.6 08/18/2020 1148    BMET    Component Value Date/Time   NA 136 10/14/2020 0455   NA 139 08/18/2020 1148   K 4.2 10/14/2020 0455   CL 103 10/14/2020 0455   CO2 28 10/14/2020 0455   GLUCOSE 121 (H) 10/14/2020 0455   BUN 15 10/14/2020 0455   BUN 15 08/18/2020 1148   CREATININE 0.85 10/14/2020 0455   CALCIUM 7.8 (L) 10/14/2020 0455   GFRNONAA >60 10/14/2020 0455   GFRAA 87 08/18/2020 1148    COAGS: Lab Results  Component Value Date   INR 1.1 10/10/2020     ASSESSMENT/PLAN: This is a 70 y.o. male 1 week postoperative right common femoral to peroneal artery bypass with greater saphenous vein.  He presents with increased edema after sitting and working at his laptop.  His graft is patent.  He is hemodynamically  stable and afebrile.  He has normal motor function and sensation of the right foot.  His incisions are healing without signs of infection.  There is some erythema noted of the right calf incision.  The right great toe has signs of cellulitis.  Recommend Bactrim, avoid sitting for extended periods of time and keeping right lower extremity elevated above the heart as much as possible.  Explained to the patient and his wife that this edema is expected and will improve over time. Continue aspirin, rivaroxaban and statin.  -Follow-up in the office as previously arranged.   Risa Grill, PA-C [facility] [phone]   I have interviewed and examined patient with PA and agree with assessment and plan above.   Niall Illes C. Donzetta Matters, MD [facility] [place] Office: [phone] Pager: [phone]

## 2020-10-20 NOTE — ED Notes (Signed)
PA Setzer at bedside.  Doppler to right foot via PA, graft is open per PA, she spent a lot of time discussing with pt proper positioning for swelling to decrease

## 2020-10-20 NOTE — ED Notes (Signed)
ED Provider at bedside, Dr. Joya Gaskins.  I will finish triage after she in finished with pt

## 2020-10-23 ENCOUNTER — Encounter (HOSPITAL_COMMUNITY): Payer: PPO

## 2020-10-24 ENCOUNTER — Ambulatory Visit (INDEPENDENT_AMBULATORY_CARE_PROVIDER_SITE_OTHER): Payer: PPO | Admitting: Vascular Surgery

## 2020-10-24 ENCOUNTER — Ambulatory Visit: Payer: PPO | Admitting: Vascular Surgery

## 2020-10-24 ENCOUNTER — Other Ambulatory Visit: Payer: Self-pay

## 2020-10-24 ENCOUNTER — Encounter: Payer: Self-pay | Admitting: Vascular Surgery

## 2020-10-24 VITALS — BP 135/65 | HR 51 | Temp 97.0°F | Resp 18 | Ht 72.0 in | Wt 161.0 lb

## 2020-10-24 DIAGNOSIS — I739 Peripheral vascular disease, unspecified: Secondary | ICD-10-CM

## 2020-10-24 NOTE — Progress Notes (Signed)
[facility] [place] PROGRESS NOTE  ASSESSMENT / PLAN: [name] is a 70 y.o. male status post right common femoral to peroneal bypass 10/13/20. He has significant swelling, but has not done a great job elevating his leg. Counseled patient to compress, elevate and rest the leg as much as possible. Follow up with me in 1 week for suture removal and surveillance ABI / Duplex.   SUBJECTIVE: Doing well. Swelling persists. He is not doing a good job staying off his leg. Right great toe is getting better.   OBJECTIVE: BP 135/65 (BP Location: Left Arm, Patient Position: Sitting, Cuff Size: Normal)   Pulse (!) 51   Temp (!) 97 F (36.1 C) (Temporal)   Resp 18   Ht 6' (1.829 m)   Wt 161 lb (73 kg)   SpO2 92%   BMI 21.84 kg/m   Palpable pulse in bypass graft 2+ edema from foot to thigh Right great toe tip gangrene appears improved overall Right foot warm and well perfused  CBC Latest Ref Rng & Units 10/16/2020 10/15/2020 10/14/2020  WBC 4.0 - 10.5 K/uL 12.6(H) 12.6(H) 14.6(H)  Hemoglobin 13.0 - 17.0 g/dL 10.8(L) 10.4(L) 10.4(L)  Hematocrit 39.0 - 52.0 % 32.1(L) 30.8(L) 30.9(L)  Platelets 150 - 400 K/uL 197 184 198     CMP Latest Ref Rng & Units 10/14/2020 10/13/2020 10/10/2020  Glucose 70 - 99 mg/dL 121(H) - 94  BUN 8 - 23 mg/dL 15 - 19  Creatinine 0.61 - 1.24 mg/dL 0.85 - 0.95  Sodium 135 - 145 mmol/L 136 137 136  Potassium 3.5 - 5.1 mmol/L 4.2 4.5 4.3  Chloride 98 - 111 mmol/L 103 - 100  CO2 22 - 32 mmol/L 28 - 31  Calcium 8.9 - 10.3 mg/dL 7.8(L) - 8.8(L)  Total Protein 6.5 - 8.1 g/dL - - 6.3(L)  Total Bilirubin 0.3 - 1.2 mg/dL - - 0.6  Alkaline Phos 38 - 126 U/L - - 79  AST 15 - 41 U/L - - 28  ALT 0 - 44 U/L - - 31    Estimated Creatinine Clearance: 82.3 mL/min (by C-G formula based on SCr of 0.85 mg/dL).  [name]. Stanford Breed, MD [facility] Phone Number: [phone] 10/24/2020 12:02 PM

## 2020-10-25 DIAGNOSIS — E785 Hyperlipidemia, unspecified: Secondary | ICD-10-CM | POA: Diagnosis not present

## 2020-10-25 DIAGNOSIS — L97509 Non-pressure chronic ulcer of other part of unspecified foot with unspecified severity: Secondary | ICD-10-CM | POA: Diagnosis not present

## 2020-10-25 DIAGNOSIS — I739 Peripheral vascular disease, unspecified: Secondary | ICD-10-CM | POA: Diagnosis not present

## 2020-10-25 DIAGNOSIS — I7025 Atherosclerosis of native arteries of other extremities with ulceration: Secondary | ICD-10-CM | POA: Diagnosis not present

## 2020-10-25 DIAGNOSIS — Z95828 Presence of other vascular implants and grafts: Secondary | ICD-10-CM | POA: Diagnosis not present

## 2020-10-26 ENCOUNTER — Other Ambulatory Visit: Payer: Self-pay

## 2020-10-26 DIAGNOSIS — I739 Peripheral vascular disease, unspecified: Secondary | ICD-10-CM

## 2020-10-27 ENCOUNTER — Ambulatory Visit (HOSPITAL_COMMUNITY)
Admission: RE | Admit: 2020-10-27 | Discharge: 2020-10-27 | Disposition: A | Discharge status: Home / Self Care | Payer: PPO | Source: Ambulatory Visit | Attending: Vascular Surgery | Admitting: Vascular Surgery

## 2020-10-27 ENCOUNTER — Ambulatory Visit (INDEPENDENT_AMBULATORY_CARE_PROVIDER_SITE_OTHER)
Admission: RE | Admit: 2020-10-27 | Discharge: 2020-10-27 | Disposition: A | Discharge status: Home / Self Care | Payer: PPO | Source: Ambulatory Visit | Attending: Vascular Surgery | Admitting: Vascular Surgery

## 2020-10-27 ENCOUNTER — Other Ambulatory Visit: Payer: Self-pay

## 2020-10-27 DIAGNOSIS — I739 Peripheral vascular disease, unspecified: Secondary | ICD-10-CM

## 2020-10-29 NOTE — Progress Notes (Deleted)
[facility] [place] PROGRESS NOTE  ASSESSMENT / PLAN: [name] is a 70 y.o. male status post right common femoral to peroneal bypass 10/13/20. He has significant swelling, but has not done a great job elevating his leg. Duplex shows retained valves in bypass graft causing severe stenosis and threatening graft patency. Will plan to address this endovascularly in cath lab tomorrow. Continue ASA / Xarelto / Statin.   SUBJECTIVE: Doing well. Swelling persists. He is not doing a good job staying off his leg. Right great toe is getting better.   OBJECTIVE: There were no vitals taken for this visit.  Palpable pulse in bypass graft 2+ edema from foot to thigh Right great toe tip gangrene appears improved overall Right foot warm and well perfused  CBC Latest Ref Rng & Units 10/16/2020 10/15/2020 10/14/2020  WBC 4.0 - 10.5 K/uL 12.6(H) 12.6(H) 14.6(H)  Hemoglobin 13.0 - 17.0 g/dL 10.8(L) 10.4(L) 10.4(L)  Hematocrit 39.0 - 52.0 % 32.1(L) 30.8(L) 30.9(L)  Platelets 150 - 400 K/uL 197 184 198     CMP Latest Ref Rng & Units 10/14/2020 10/13/2020 10/10/2020  Glucose 70 - 99 mg/dL 121(H) - 94  BUN 8 - 23 mg/dL 15 - 19  Creatinine 0.61 - 1.24 mg/dL 0.85 - 0.95  Sodium 135 - 145 mmol/L 136 137 136  Potassium 3.5 - 5.1 mmol/L 4.2 4.5 4.3  Chloride 98 - 111 mmol/L 103 - 100  CO2 22 - 32 mmol/L 28 - 31  Calcium 8.9 - 10.3 mg/dL 7.8(L) - 8.8(L)  Total Protein 6.5 - 8.1 g/dL - - 6.3(L)  Total Bilirubin 0.3 - 1.2 mg/dL - - 0.6  Alkaline Phos 38 - 126 U/L - - 79  AST 15 - 41 U/L - - 28  ALT 0 - 44 U/L - - 31    Estimated Creatinine Clearance: 82.3 mL/min (by C-G formula based on SCr of 0.85 mg/dL).  LOWER EXTREMITY ARTERIAL DUPLEX STUDY   Patient Name: [name] Date of Exam:  10/27/2020  Medical Rec #: [id number]     Accession #:  [id number]  Date of Birth: [birth year]     Patient Gender: M  Patient Age:  69Y  Exam Location[facility]   Procedure:   VAS [place] LOWER EXTREMITY BYPASS GRAFT DUPLEX  Referring Phys: [id number] [name] Demara Lover    ---------------------------------------------------------------------------  -----    Indications: Gangrene.    Vascular Interventions: 10/13/20: Right fem-peroneal BPG with insitu GSV.  Current ABI:      Right: 0.2/absent Left: [place]/0.69   Comparison Study: No previous post op exam.   Performing Technologist: Ralene Cork RVT     Examination Guidelines: A complete evaluation includes B-mode imaging,  spectral  Doppler, color Doppler, and power Doppler as needed of all accessible  portions  of each vessel. Bilateral testing is considered an integral part of a  complete  examination. Limited examinations for reoccurring indications may be  performed  as noted.     Right Graft #1: femoral to peroneal artery  +------------------+--------+--------+----------+--------------+           PSV cm/sStenosisWaveform Comments     +------------------+--------+--------+----------+--------------+  Inflow      79       monophasic         +------------------+--------+--------+----------+--------------+  Prox Anastomosis 116       monophasic         +------------------+--------+--------+----------+--------------+  Proximal Graft  638       monophasicat valve     +------------------+--------+--------+----------+--------------+  Mid Graft     277       monophasicat knee. valve  +------------------+--------+--------+----------+--------------+  Distal Graft   43       monophasic         +------------------+--------+--------+----------+--------------+  Distal Anastomosis52       monophasic         +------------------+--------+--------+----------+--------------+  Outflow      86       monophasic          +------------------+--------+--------+----------+--------------+      Summary:  Right: Elevated velocities throughout the graft at valves. Vein graft  otherwise widely patent. Fluid adjacent to the graft from mid to distal  thigh. Hypoechoic area adjacent to graft in the goin.     See table(s) above for measurements and observations.      Electronically signed by Jamelle Haring on 10/27/2020 at 5:28:03 PM.    [place] STUDY   Patient Name: [name] Date of Exam:  10/27/2020  Medical Rec #: [id number]     Accession #:  [id number]  Date of Birth: [birth year]     Patient Gender: M  Patient Age:  69Y  Exam Location: [facility]  Procedure:   VAS [place] ABI WITH/WO TBI  Referring Phys: [id number] [name] [place]    ---------------------------------------------------------------------------  -----    Indications: Gangrene.    Vascular Interventions: 10/13/20: Right fem-peroneal BPG with insitu GSV.   Performing Technologist: Ralene Cork RVT     Examination Guidelines: A complete evaluation includes at minimum, Doppler  waveform signals and systolic blood pressure reading at the level of  bilateral  brachial, anterior tibial, and posterior tibial arteries, when vessel  segments  are accessible. Bilateral testing is considered an integral part of a  complete  examination. Photoelectric Plethysmograph (PPG) waveforms and toe systolic  pressure readings are included as required and additional duplex testing  as  needed. Limited examinations for reoccurring indications may be performed  as  noted.     ABI Findings:  +---------+------------------+-----+-------------------+--------+  Right  Rt Pressure (mmHg)IndexWaveform      Comment   +---------+------------------+-----+-------------------+--------+  Brachial 119                            +---------+------------------+-----+-------------------+--------+  PTA   24        0.20 dampened monophasic      +---------+------------------+-----+-------------------+--------+  DP    0         0.00 absent             +---------+------------------+-----+-------------------+--------+  Great Toe            Absent             +---------+------------------+-----+-------------------+--------+   +---------+------------------+-----+---------+-------+  Left   Lt Pressure (mmHg)IndexWaveform Comment  +---------+------------------+-----+---------+-------+  Brachial 123                      +---------+------------------+-----+---------+-------+  PTA   165        1.34 triphasic      +---------+------------------+-----+---------+-------+  DP    168        1.37 triphasic      +---------+------------------+-----+---------+-------+  Great Toe85        0.69            +---------+------------------+-----+---------+-------+   +-------+-----------+-----------+------------+------------+  ABI/TBIToday's ABIToday's TBIPrevious ABIPrevious TBI  +-------+-----------+-----------+------------+------------+  Right 0.2    absent                 +-------+-----------+-----------+------------+------------+  Left       0.69                  +-------+-----------+-----------+------------+------------+        No previous post op ABI.    Summary:  Right: Resting right ankle-brachial index indicates severe right lower  extremity arterial disease. The right toe-brachial index is abnormal.   Left: Resting left ankle-brachial index indicates noncompressible left  lower extremity arteries. The left toe-brachial index is normal. LT Great  toe pressure = 85 mmHg.      *See table(s) above  for measurements and observations.      Electronically signed by Jamelle Haring on 10/27/2020 at 5:28:20 PM.   [name]. Stanford Breed, MD [facility] Phone Number: [phone] 10/29/2020 9:46 AM

## 2020-10-30 ENCOUNTER — Other Ambulatory Visit: Payer: Self-pay

## 2020-10-31 ENCOUNTER — Encounter: Payer: PPO | Admitting: Vascular Surgery

## 2020-10-31 ENCOUNTER — Other Ambulatory Visit (HOSPITAL_COMMUNITY)
Admission: RE | Admit: 2020-10-31 | Discharge: 2020-10-31 | Disposition: A | Discharge status: Home / Self Care | Payer: PPO | Source: Ambulatory Visit | Attending: Vascular Surgery | Admitting: Vascular Surgery

## 2020-10-31 DIAGNOSIS — Z01812 Encounter for preprocedural laboratory examination: Secondary | ICD-10-CM | POA: Insufficient documentation

## 2020-10-31 DIAGNOSIS — Z20822 Contact with and (suspected) exposure to covid-19: Secondary | ICD-10-CM | POA: Insufficient documentation

## 2020-10-31 LAB — SARS CORONAVIRUS 2 (TAT 6-24 HRS): SARS Coronavirus 2: NEGATIVE

## 2020-11-01 ENCOUNTER — Encounter (HOSPITAL_COMMUNITY)
Admission: RE | Disposition: A | Discharge status: Home / Self Care | Payer: Self-pay | Source: Home / Self Care | Attending: Vascular Surgery

## 2020-11-01 ENCOUNTER — Other Ambulatory Visit: Payer: Self-pay

## 2020-11-01 ENCOUNTER — Ambulatory Visit (HOSPITAL_COMMUNITY)
Admission: RE | Admit: 2020-11-01 | Discharge: 2020-11-01 | Disposition: A | Discharge status: Home / Self Care | Payer: PPO | Attending: Vascular Surgery | Admitting: Vascular Surgery

## 2020-11-01 DIAGNOSIS — T82510A Breakdown (mechanical) of surgically created arteriovenous fistula, initial encounter: Secondary | ICD-10-CM | POA: Diagnosis not present

## 2020-11-01 DIAGNOSIS — T82898A Other specified complication of vascular prosthetic devices, implants and grafts, initial encounter: Secondary | ICD-10-CM

## 2020-11-01 DIAGNOSIS — Y832 Surgical operation with anastomosis, bypass or graft as the cause of abnormal reaction of the patient, or of later complication, without mention of misadventure at the time of the procedure: Secondary | ICD-10-CM | POA: Diagnosis not present

## 2020-11-01 DIAGNOSIS — M7989 Other specified soft tissue disorders: Secondary | ICD-10-CM | POA: Insufficient documentation

## 2020-11-01 HISTORY — PX: PERIPHERAL VASCULAR INTERVENTION: CATH118257

## 2020-11-01 HISTORY — PX: ABDOMINAL AORTOGRAM W/LOWER EXTREMITY: CATH118223

## 2020-11-01 LAB — POCT I-STAT, CHEM 8
BUN: 22 mg/dL (ref 8–23)
Calcium, Ion: 1.16 mmol/L (ref 1.15–1.40)
Chloride: 102 mmol/L (ref 98–111)
Creatinine, Ser: 0.8 mg/dL (ref 0.61–1.24)
Glucose, Bld: 100 mg/dL — ABNORMAL HIGH (ref 70–99)
HCT: 39 % (ref 39.0–52.0)
Hemoglobin: 13.3 g/dL (ref 13.0–17.0)
Potassium: 5.4 mmol/L — ABNORMAL HIGH (ref 3.5–5.1)
Sodium: 139 mmol/L (ref 135–145)
TCO2: 30 mmol/L (ref 22–32)

## 2020-11-01 LAB — POCT ACTIVATED CLOTTING TIME: Activated Clotting Time: 160 s

## 2020-11-01 SURGERY — ABDOMINAL AORTOGRAM W/LOWER EXTREMITY
Anesthesia: LOCAL | Laterality: Right

## 2020-11-01 MED ORDER — MIDAZOLAM HCL 2 MG/2ML IJ SOLN
INTRAMUSCULAR | Status: DC | PRN
  Administered 2020-11-01: 1 mg via INTRAVENOUS

## 2020-11-01 MED ORDER — LIDOCAINE HCL (PF) 1 % IJ SOLN
INTRAMUSCULAR | Status: DC | PRN
  Administered 2020-11-01: 15 mL via INTRADERMAL

## 2020-11-01 MED ORDER — SODIUM CHLORIDE 0.9 % IV SOLN: INTRAVENOUS | Status: DC

## 2020-11-01 MED ORDER — SODIUM CHLORIDE 0.9% FLUSH: 3.0000 mL | Freq: Two times a day (BID) | INTRAVENOUS | Status: DC

## 2020-11-01 MED ORDER — SODIUM CHLORIDE 0.9 % WEIGHT BASED INFUSION: 1.0000 mL/kg/h | INTRAVENOUS | Status: DC

## 2020-11-01 MED ORDER — LABETALOL HCL 5 MG/ML IV SOLN: 10.0000 mg | INTRAVENOUS | Status: DC | PRN

## 2020-11-01 MED ORDER — FENTANYL CITRATE (PF) 100 MCG/2ML IJ SOLN
INTRAMUSCULAR | Status: AC
  Filled 2020-11-01: qty 2

## 2020-11-01 MED ORDER — FENTANYL CITRATE (PF) 100 MCG/2ML IJ SOLN
INTRAMUSCULAR | Status: DC | PRN
  Administered 2020-11-01 (×2): 50 ug via INTRAVENOUS

## 2020-11-01 MED ORDER — HEPARIN (PORCINE) IN NACL 1000-0.9 UT/500ML-% IV SOLN
INTRAVENOUS | Status: AC
  Filled 2020-11-01: qty 1000

## 2020-11-01 MED ORDER — LIDOCAINE HCL (PF) 1 % IJ SOLN
INTRAMUSCULAR | Status: AC
  Filled 2020-11-01: qty 30

## 2020-11-01 MED ORDER — HEPARIN SODIUM (PORCINE) 1000 UNIT/ML IJ SOLN
INTRAMUSCULAR | Status: AC
  Filled 2020-11-01: qty 1

## 2020-11-01 MED ORDER — ONDANSETRON HCL 4 MG/2ML IJ SOLN: 4.0000 mg | Freq: Four times a day (QID) | INTRAMUSCULAR | Status: DC | PRN

## 2020-11-01 MED ORDER — HEPARIN SODIUM (PORCINE) 1000 UNIT/ML IJ SOLN
INTRAMUSCULAR | Status: DC | PRN
  Administered 2020-11-01: 7000 [IU] via INTRAVENOUS

## 2020-11-01 MED ORDER — MIDAZOLAM HCL 2 MG/2ML IJ SOLN
INTRAMUSCULAR | Status: AC
  Filled 2020-11-01: qty 2

## 2020-11-01 MED ORDER — HEPARIN (PORCINE) IN NACL 1000-0.9 UT/500ML-% IV SOLN
INTRAVENOUS | Status: DC | PRN
Start: 2020-11-01 — End: 2020-11-01
  Administered 2020-11-01 (×2): 500 mL

## 2020-11-01 MED ORDER — SODIUM CHLORIDE 0.9% FLUSH: 3.0000 mL | INTRAVENOUS | Status: DC | PRN

## 2020-11-01 MED ORDER — SODIUM CHLORIDE 0.9 % IV SOLN: 250.0000 mL | INTRAVENOUS | Status: DC | PRN

## 2020-11-01 MED ORDER — HYDRALAZINE HCL 20 MG/ML IJ SOLN: 5.0000 mg | INTRAMUSCULAR | Status: DC | PRN

## 2020-11-01 MED ORDER — IODIXANOL 320 MG/ML IV SOLN
INTRAVENOUS | Status: DC | PRN
  Administered 2020-11-01: 110 mL

## 2020-11-01 MED ORDER — ACETAMINOPHEN 325 MG PO TABS: 650.0000 mg | ORAL_TABLET | ORAL | Status: DC | PRN

## 2020-11-01 SURGICAL SUPPLY — 19 items
CATH ANGIO 5F BER 100CM (CATHETERS) ×1 IMPLANT
CATH CXI 4F 150 DAV (CATHETERS) ×1 IMPLANT
CATH OMNI FLUSH 5F 65CM (CATHETERS) ×1 IMPLANT
CATH QUICKCROSS SUPP .035X90CM (MICROCATHETER) ×1 IMPLANT
CLOSURE PERCLOSE PROSTYLE (VASCULAR PRODUCTS) ×1 IMPLANT
COIL NESTER 14X6 (Embolic) ×4 IMPLANT
COVER DOME SNAP 22 D (MISCELLANEOUS) ×1 IMPLANT
GLIDEWIRE ADV .035X260CM (WIRE) ×1 IMPLANT
KIT MICROPUNCTURE NIT STIFF (SHEATH) ×1 IMPLANT
KIT PV (KITS) ×3 IMPLANT
SHEATH PINNACLE 5F 10CM (SHEATH) ×1 IMPLANT
SHEATH PINNACLE MP 6F 45CM (SHEATH) ×1 IMPLANT
SHEATH PROBE COVER 6X72 (BAG) ×1 IMPLANT
SYR MEDRAD MARK V 150ML (SYRINGE) ×1 IMPLANT
TRANSDUCER W/STOPCOCK (MISCELLANEOUS) ×3 IMPLANT
TRAY PV CATH (CUSTOM PROCEDURE TRAY) ×3 IMPLANT
TUBING CIL FLEX 10 FLL-RA (TUBING) ×1 IMPLANT
WIRE BENTSON .035X145CM (WIRE) ×1 IMPLANT
WIRE HI TORQ VERSACORE J 260CM (WIRE) ×1 IMPLANT

## 2020-11-01 NOTE — Discharge Instructions (Signed)
Femoral Site Care  This sheet gives you information about how to care for yourself after your procedure. Your health care provider may also give you more specific instructions. If you have problems or questions, contact your health care provider. What can I expect after the procedure? After the procedure, it is common to have:  Bruising that usually fades within 1-2 weeks.  Tenderness at the site. Follow these instructions at home: Wound care  Follow instructions from your health care provider about how to take care of your insertion site. Make sure you: ? Wash your hands with soap and water before you change your bandage (dressing). If soap and water are not available, use hand sanitizer. ? Change your dressing as told by your health care provider. ? Leave stitches (sutures), skin glue, or adhesive strips in place. These skin closures may need to stay in place for 2 weeks or longer. If adhesive strip edges start to loosen and curl up, you may trim the loose edges. Do not remove adhesive strips completely unless your health care provider tells you to do that.  Do not take baths, swim, or use a hot tub until your health care provider approves.  You may shower 24-48 hours after the procedure or as told by your health care provider. ? Gently wash the site with plain soap and water. ? Pat the area dry with a clean towel. ? Do not rub the site. This may cause bleeding.  Do not apply powder or lotion to the site. Keep the site clean and dry.  Check your femoral site every day for signs of infection. Check for: ? Redness, swelling, or pain. ? Fluid or blood. ? Warmth. ? Pus or a bad smell. Activity  For the first 2-3 days after your procedure, or as long as directed: ? Avoid climbing stairs as much as possible. ? Do not squat.  Do not lift anything that is heavier than 10 lb (4.5 kg), or the limit that you are told, until your health care provider says that it is safe.  Rest as  directed. ? Avoid sitting for a long time without moving. Get up to take short walks every 1-2 hours.  Do not drive for 24 hours if you were given a medicine to help you relax (sedative). General instructions  Take over-the-counter and prescription medicines only as told by your health care provider.  Keep all follow-up visits as told by your health care provider. This is important. Contact a health care provider if you have:  A fever or chills.  You have redness, swelling, or pain around your insertion site. Get help right away if:  The catheter insertion area swells very fast.  You pass out.  You suddenly start to sweat or your skin gets clammy.  The catheter insertion area is bleeding, and the bleeding does not stop when you hold steady pressure on the area.  The area near or just beyond the catheter insertion site becomes pale, cool, tingly, or numb. These symptoms may represent a serious problem that is an emergency. Do not wait to see if the symptoms will go away. Get medical help right away. Call your local emergency services (911 in the [place] Do not drive yourself to the hospital. Summary  After the procedure, it is common to have bruising that usually fades within 1-2 weeks.  Check your femoral site every day for signs of infection.  Do not lift anything that is heavier than 10 lb (4.5 kg), or   the limit that you are told, until your health care provider says that it is safe. This information is not intended to replace advice given to you by your health care provider. Make sure you discuss any questions you have with your health care provider. Document Revised: 02/25/2020 Document Reviewed: 02/25/2020 Elsevier Patient Education  2021 [employer]  

## 2020-11-01 NOTE — Op Note (Addendum)
DATE OF SERVICE: 11/01/2020  PATIENT:  [name]  70 y.o. male  PRE-OPERATIVE DIAGNOSIS:  Status post right fem-peroneal bypass. Postoperative ultrasound concerning for retained valve x2.  POST-OPERATIVE DIAGNOSIS:  Right fem-peroneal bypass with A-V fistula x 3  PROCEDURE:   1) US guided left common femoral artery access 2) Right lower extremity angiography with third order cannulation (136mL contrast) 3) Coil embolization of arteriovenous fistula x 2 in R fem-pop bypass (60mm Nestor coils x 4 total, 2 in each fistula) 4) Conscious sedation (94 minutes)  SURGEON:  Surgeon(s) and Role:    * [name], MD - Primary  ASSISTANT: none  An assistant was required to facilitate exposure and expedite the case.  ANESTHESIA:   local and IV sedation  EBL: min  BLOOD ADMINISTERED:none  DRAINS: none   LOCAL MEDICATIONS USED:  LIDOCAINE   SPECIMEN:  none  COUNTS: confirmed correct.  TOURNIQUET:  None  PATIENT DISPOSITION:  PACU - hemodynamically stable.   Delay start of Pharmacological VTE agent (>24hrs) due to surgical blood loss or risk of bleeding: no  INDICATION FOR PROCEDURE: [name] is a 70 y.o. male status post right fem-peroneal bypass for gangrene. He has had severe swelling in the right leg since surgery. His postoperative ABI was 0.2. His postoperative ultrasound suggested retained valve x 2 in the bypass. After careful discussion of risks, benefits, and alternatives the patient was offered angiography. We specifically discussed access site complications. The patient understood and wished to proceed.  OPERATIVE FINDINGS:  Widely patent bypass graft Three arteriovenous fistulae in bypass graft (missed side branches from in-situ bypass). Successful coil embolization of proximal two AVF. At this point I discussed the findings with three of my partners.  We all felt it best to leave the last fistula given the poor bypass target. We felt the fistula might  encourage patency of the bypass. Once the toe is healed we can pursue ligation of the bypass branch fistula. Good outflow noted to the foot.  DESCRIPTION OF PROCEDURE: After identification of the patient in the pre-operative holding area, the patient was transferred to the operating room. The patient was positioned supine on the operating room table. Anesthesia was induced. The groins was prepped and draped in standard fashion. A surgical pause was performed confirming correct patient, procedure, and operative location.  The right groin was anesthetized with subcutaneous injection of 1% lidocaine. Using ultrasound guidance, the right common femoral artery was accessed with micropuncture technique. Fluoroscopy was used to confirm cannulation over the femoral head. Sheathogram was not performed. The 38F sheath was upsized to 9100F.   An 035 glidewire advantage was advanced into the distal aorta. The left common iliac artery was selected with the 035 glidewire advantage. The wire was advanced into the common femoral artery. Over the wire the omni flush catheter was advanced into the external iliac artery. Selective angiography was performed - see above for details.   The decision was made to intervene. The patient was heparinized with 7000 units of heparin. The sheath was exchanged for a 100F x 45cm sheath. Selective angiography of the left lower extremity was performed prior to intervention. Two proximal AVF were embolized with Nestor coils. I left the distal most fistula alone to encourage bypass graft patency. Completion angiography revealed successful coil embolization of the two proximal AVF.  A perclose device was used to close the arteriotomy. Hemostasis was excellent upon completion.  Conscious sedation was administered with the use of IV fentanyl and  midazolam under continuous physician and nurse monitoring.  Heart rate, blood pressure, and oxygen saturation were continuously monitored.  Total sedation  time was 94 minutes  Upon completion of the case instrument and sharps counts were confirmed correct. The patient was transferred to the PACU in good condition. I was present for all portions of the procedure.  PLAN: Return to clinic in 1 month with repeat ABI and arterial duplex. Continue ASA / Xarelto / Statin.   [name]. Stanford Breed, MD [facility] Phone Number: [phone] 11/01/2020 4:33 PM

## 2020-11-02 ENCOUNTER — Encounter (HOSPITAL_COMMUNITY): Payer: Self-pay | Admitting: Vascular Surgery

## 2020-11-02 NOTE — H&P (Signed)
[facility] [place] PROGRESS NOTE  ASSESSMENT / PLAN: [name] is a 70 y.o. male status post right common femoral to peroneal bypass 10/13/20. Surveillance duplex suggests retained valve. For angiogram today.  SUBJECTIVE: Doing well. Swelling persists. He is not doing a good job staying off his leg. Right great toe is getting better.   OBJECTIVE: BP 135/74   Pulse 89   Temp 97.8 F (36.6 C) (Oral)   Resp 17   Ht 6' (1.829 m)   Wt 76.2 kg   SpO2 98%   BMI 22.78 kg/m   Palpable pulse in bypass graft 2+ edema from foot to thigh Right great toe tip gangrene appears improved overall Right foot warm and well perfused  CBC Latest Ref Rng & Units 11/01/2020 10/16/2020 10/15/2020  WBC 4.0 - 10.5 K/uL - 12.6(H) 12.6(H)  Hemoglobin 13.0 - 17.0 g/dL 13.3 10.8(L) 10.4(L)  Hematocrit 39.0 - 52.0 % 39.0 32.1(L) 30.8(L)  Platelets 150 - 400 K/uL - 197 184     CMP Latest Ref Rng & Units 11/01/2020 10/14/2020 10/13/2020  Glucose 70 - 99 mg/dL 100(H) 121(H) -  BUN 8 - 23 mg/dL 22 15 -  Creatinine 0.61 - 1.24 mg/dL 0.80 0.85 -  Sodium 135 - 145 mmol/L 139 136 137  Potassium 3.5 - 5.1 mmol/L 5.4(H) 4.2 4.5  Chloride 98 - 111 mmol/L 102 103 -  CO2 22 - 32 mmol/L - 28 -  Calcium 8.9 - 10.3 mg/dL - 7.8(L) -  Total Protein 6.5 - 8.1 g/dL - - -  Total Bilirubin 0.3 - 1.2 mg/dL - - -  Alkaline Phos 38 - 126 U/L - - -  AST 15 - 41 U/L - - -  ALT 0 - 44 U/L - - -    Estimated Creatinine Clearance: 91.3 mL/min (by C-G formula based on SCr of 0.8 mg/dL).  [name]. Stanford Breed, MD [facility] Phone Number: [phone] 11/02/2020 7:28 AM

## 2020-11-06 ENCOUNTER — Encounter (HOSPITAL_COMMUNITY): Payer: Self-pay | Admitting: Vascular Surgery

## 2020-11-07 ENCOUNTER — Telehealth: Payer: Self-pay | Admitting: Hematology

## 2020-11-07 NOTE — Telephone Encounter (Signed)
Received a new hem referral from [place] for peripheral vascular disease, unspecified. [name] has been cld and scheduled to see [name] on 5/5 at 1pm. Pt aware to arrive 20 minutes early.

## 2020-11-08 NOTE — Progress Notes (Signed)
HEMATOLOGY/ONCOLOGY CONSULTATION NOTE  Date of Service: 11/09/2020  Patient Care Team: [name], DO as PCP - General (Internal Medicine) [name], MD as PCP - Cardiology (Cardiology)  CHIEF COMPLAINTS/PURPOSE OF CONSULTATION:  Peripheral Vascular disease  HISTORY OF PRESENTING ILLNESS:   [name] is a wonderful 70 y.o. male who has been referred to [place] by Reginold Agent, NP at [facility] for evaluation and management of strange presentation of complete right femoral artery occlusion and peripheral arterial disease. The pt reports that he is doing well overall. We are joined today by his wife.  The pt underwent a right common femoral to peroneal bypass on 10/13/2020 by Dr. Jamelle Haring. The pt notes that his peripheral arterial disease was diagnosed in December 2020 in the presence of pain in his lower calf upon running and performing activity for extended periods of time. The pt notes that this would go away with time and was not predictably recurrent with a set amount of activity. The pt notes he then saw Cardiologist and a Vascular Surgeon, who did not feel like the pt needed a stint or any further action at that point. The pt notes this pain occurred again in December 2021 and was worse than before. The pt notes that he would have to shake his foot at night and move around after sitting/laying for periods. The pt notes that in 2021 they only did ultrasounds and no angiograms. The pt notes that he was able to go the gym daily in 2021 until January 2022 when it got much worse. The pt notes that after the  December 2020 ultrasound, he was not told he had occlusion and still felt a faint pulse. The pt was never told he had occlusion prior to March of this year. The pt notes he was referred for evaluation due to the fact that he had total occlusion on the right and nothing on the left, which was very strange.  The pt notes that he was only experiencing pain and  ulcers prior to the surgery. The pt notes there was some color change in foot prior to surgery. In January, the pt notes his symptoms got worse much quickly and got worse as time progressed. The pt went from going to the gym everyday in January to barely being able to walk in February. The pt also notes that his leg swelling has only occurred post surgery, denying this prior to his bypass. The pt denies any history of heart issues in the past. The pt notes that on his follow up arteriogram that did not see any blockage. The pt notes that he was a smoker 40 years ago intermittently. He notes he smokes one half pack daily for the equivalent of 6-7 years. The pt denies any previous hx of blood clots or familial blood or clotting disorders. The pt notes he was adopted. The pt notes that his only past medical history is slightly higher cholesterol. The pt notes that he currently consumes around 2-3 beers and 2 bourbons every week. The pt notes this has been more in the past, but not much more. Currently, the pt denies any pain while walking. He notes his toe is still really dark on part of it as before surgery. The pt notes issues after elevating his feet, as it feels it is sticking with pins. He notes no issues when he is moving. The worst pain is when he is sitting.The pt denies pain in his calf, thigh, or  buttocks-- only the toe. The pt is currently on ASA, Xarelto, and Pletol. The pt notes he is currently on 2.5 mg Xarelto BID.  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 The pt notes that many years ago he had a piece of metal in his leg while he was in [place], but it was not deep nor were they concerned about this. The pt denies ever being on testosterone replacement and herbal supplements.   The pt notes that three years ago he had [facility] spotted fever and was very flu-like sick. The pt notes they did not  actually do all the tests and treated him empirically with Doxycycline. The pt notes he had a very small tick on his side and ten days later could not get out of bed. The pt was very sick and denies having a rash.  On review of systems, pt reports toe pain, toe discoloration, leg swelling and denies new bone pains, back pain, new lumps/bumps, and any other symptoms.  MEDICAL HISTORY:  Past Medical History:  Diagnosis Date  . Hyperlipidemia   . Hyperlipidemia   . PAD (peripheral artery disease) ([place])     SURGICAL HISTORY: Past Surgical History:  Procedure Laterality Date  . ABDOMINAL AORTOGRAM W/LOWER EXTREMITY [name] 09/11/2020   Procedure: ABDOMINAL AORTOGRAM W/LOWER EXTREMITY;  Surgeon: [name], MD;  Location: [facility];  Service: Cardiovascular;  Laterality: [name];  . ABDOMINAL AORTOGRAM W/LOWER EXTREMITY [name] 11/01/2020   Procedure: ABDOMINAL AORTOGRAM W/LOWER EXTREMITY;  Surgeon: [name], MD;  Location: [facility];  Service: Cardiovascular;  Laterality: [name];  . COLONOSCOPY     x2  . FEMORAL-TIBIAL BYPASS GRAFT Right 10/13/2020   Procedure: RIGHT COMMON FEMORAL ARTERY TO PERONEAL ARTERY BYPASS WITH INSITU GREATER SAPHENOUS VEIN;  Surgeon: [name], MD;  Location: [facility];  Service: Vascular;  Laterality: Right;  . PERIPHERAL VASCULAR INTERVENTION Right 11/01/2020   Procedure: PERIPHERAL VASCULAR INTERVENTION;  Surgeon: [name], MD;  Location: [facility];  Service: Cardiovascular;  Laterality: Right;  . TONSILLECTOMY    . TONSILLECTOMY AND ADENOIDECTOMY      SOCIAL HISTORY: Social History   Socioeconomic History  . Marital status: Married    Spouse name: [name]  . Number of children: [name]  . Years of education: [name]  . Highest education level: [name]  Occupational History  . [name]  Tobacco Use  . Smoking status: Never Smoker  . Smokeless tobacco: Never Used  Vaping Use  . Vaping Use: Never used   Substance and Sexual Activity  . Alcohol use: Yes    Alcohol/week: 4.0 standard drinks    Types: 2 Cans of beer, 2 Shots of liquor per week  . Drug use: Never  . Sexual activity: [name]  Other Topics Concern  . [name]  Social History Narrative  . [name]   Social Determinants of Health   Financial Resource Strain: [name]  Food Insecurity: [name]  Transportation Needs: [name]  Physical Activity: [name]  Stress: [name]  Social Connections: [name]  Intimate Partner Violence: [name]    FAMILY HISTORY: Family History  Adopted: Yes    ALLERGIES:  is allergic to penicillins.  MEDICATIONS:  Current  Outpatient Medications  Medication Sig Dispense Refill  . aspirin EC 81 MG tablet Take 81 mg by mouth daily. Swallow whole.    Marland Kitchen BIOTIN PO Take 1 tablet by mouth daily.    Marland Kitchen CALCIUM PO Take 1 tablet by mouth daily.    . cilostazol (PLETAL) 50 MG tablet Take 1 tablet (50 mg total) by mouth 2 (two) times daily. 60 tablet 6  . ezetimibe (ZETIA) 10 MG tablet Take 1 tablet (10 mg total) by mouth daily. (Patient taking differently: Take 10 mg by mouth every evening.) 90 tablet 1  . oxyCODONE-acetaminophen (PERCOCET/ROXICET) 5-325 MG tablet Take 1 tablet by mouth every 4 (four) hours as needed for moderate pain. 30 tablet 0  . rivaroxaban (XARELTO) 2.5 MG TABS tablet Take 1 tablet (2.5 mg total) by mouth 2 (two) times daily. 60 tablet 3  . rosuvastatin (CRESTOR) 40 MG tablet Take 1 tablet (40 mg total) by mouth every evening. 90 tablet 1   Current Facility-Administered Medications  Medication Dose Route Frequency Provider Last Rate Last Admin  . sodium chloride flush (NS) 0.9 % injection 3 mL  3 mL Intravenous Q12H [name], MD        REVIEW OF SYSTEMS:   10 Point review of Systems was done is negative except as noted above.  PHYSICAL EXAMINATION: ECOG PERFORMANCE STATUS: 1 - Symptomatic but completely ambulatory  . Vitals:    11/09/20 1324  BP: 135/82  Pulse: 78  Resp: 16  Temp: 98 F (36.7 C)  SpO2: 100%   Filed Weights   11/09/20 1324  Weight: 160 lb 14.4 oz (73 kg)   .Body mass index is 21.82 kg/m.   GENERAL:alert, in no acute distress and comfortable SKIN: no acute rashes, no significant lesions EYES: conjunctiva are pink and non-injected, sclera anicteric OROPHARYNX: MMM, no exudates, no oropharyngeal erythema or ulceration NECK: supple, no JVD LYMPH:  no palpable lymphadenopathy in the cervical, axillary or inguinal regions LUNGS: clear to auscultation b/l with normal respiratory effort HEART: regular rate & rhythm ABDOMEN:  normoactive bowel sounds , non tender, not distended. Extremity: right pedal edema PSYCH: alert & oriented x 3 with fluent speech NEURO: no focal motor/sensory deficits  LABORATORY DATA:  I have reviewed the data as listed  . CBC Latest Ref Rng & Units 11/09/2020 11/01/2020 10/16/2020  WBC 4.0 - 10.5 K/uL 7.6 - 12.6(H)  Hemoglobin 13.0 - 17.0 g/dL 12.7(L) 13.3 10.8(L)  Hematocrit 39.0 - 52.0 % 39.4 39.0 32.1(L)  Platelets 150 - 400 K/uL 253 - 197    . CMP Latest Ref Rng & Units 11/09/2020 11/01/2020 10/14/2020  Glucose 70 - 99 mg/dL 86 100(H) 121(H)  BUN 8 - 23 mg/dL 19 22 15   Creatinine 0.61 - 1.24 mg/dL 0.86 0.80 0.85  Sodium 135 - 145 mmol/L 140 139 136  Potassium 3.5 - 5.1 mmol/L 4.1 5.4(H) 4.2  Chloride 98 - 111 mmol/L 103 102 103  CO2 22 - 32 mmol/L 28 - 28  Calcium 8.9 - 10.3 mg/dL 8.6(L) - 7.8(L)  Total Protein 6.5 - 8.1 g/dL 6.5 - -  Total Bilirubin 0.3 - 1.2 mg/dL 0.4 - -  Alkaline Phos 38 - 126 U/L 102 - -  AST 15 - 41 U/L 29 - -  ALT 0 - 44 U/L 55(H) - -     RADIOGRAPHIC STUDIES: I have personally reviewed the radiological images as listed and agreed with the findings in the report. PERIPHERAL VASCULAR CATHETERIZATION  Result Date: 11/01/2020  DATE OF SERVICE: 11/01/2020  PATIENT:  Loma SenderWayne LEE Cassis  70 y.o. male  PRE-OPERATIVE DIAGNOSIS:   Status post right fem-peroneal bypass. Postoperative ultrasound concerning for retained valve x2.  POST-OPERATIVE DIAGNOSIS:  Right fem-peroneal bypass with A-V fistula x 3  PROCEDURE:  1) US guided left common femoral artery access 2) Right lower extremity angiography with third order cannulation (110mL contrast) 3) Coil embolization of arteriovenous fistula x 2 in R fem-pop bypass (6mm Nestor coils x 4 total, 2 in each fistula) 4) Conscious sedation (94 minutes)  SURGEON:  Surgeon(s) and Role:    * Leonie DouglasHawken, Thomas N, MD - Primary  ASSISTANT: none  An assistant was required to facilitate exposure and expedite the case.  ANESTHESIA:   local and IV sedation  EBL: min  BLOOD ADMINISTERED:none  DRAINS: none  LOCAL MEDICATIONS USED:  LIDOCAINE  SPECIMEN:  none  COUNTS: confirmed correct.  TOURNIQUET:  None  PATIENT DISPOSITION:  PACU - hemodynamically stable.  Delay start of Pharmacological VTE agent (>24hrs) due to surgical blood loss or risk of bleeding: no  INDICATION FOR PROCEDURE: Loma SenderWayne LEE Gotcher is a 70 y.o. male status post right fem-peroneal bypass for gangrene. He has had severe swelling in the right leg since surgery. His postoperative ABI was 0.2. His postoperative ultrasound suggested retained valve x 2 in the bypass. After careful discussion of risks, benefits, and alternatives the patient was offered angiography. We specifically discussed access site complications. The patient understood and wished to proceed.  OPERATIVE FINDINGS: Widely patent bypass graft Three arteriovenous fistulae in bypass graft (missed side branches from in-situ bypass). Successful coil embolization of proximal two AVF. At this point I discussed the findings with three of my partners. We all felt it best to leave the last fistula given the poor bypass target. We felt the fistula might encourage patency of the bypass. Once the toe is healed we can pursue ligation of the bypass branch fistula. Good outflow noted to the  foot.  DESCRIPTION OF PROCEDURE: After identification of the patient in the pre-operative holding area, the patient was transferred to the operating room. The patient was positioned supine on the operating room table. Anesthesia was induced. The groins was prepped and draped in standard fashion. A surgical pause was performed confirming correct patient, procedure, and operative location.  The right groin was anesthetized with subcutaneous injection of 1% lidocaine. Using ultrasound guidance, the right common femoral artery was accessed with micropuncture technique. Fluoroscopy was used to confirm cannulation over the femoral head. Sheathogram was not performed. The 263F sheath was upsized to 24F.  An 035 glidewire advantage was advanced into the distal aorta. The left common iliac artery was selected with the 035 glidewire advantage. The wire was advanced into the common femoral artery. Over the wire the omni flush catheter was advanced into the external iliac artery. Selective angiography was performed - see above for details.  The decision was made to intervene. The patient was heparinized with 7000 units of heparin. The sheath was exchanged for a 63F x 45cm sheath. Selective angiography of the left lower extremity was performed prior to intervention. Two proximal AVF were embolized with Nestor coils. I left the distal most fistula alone to encourage bypass graft patency. Completion angiography revealed successful coil embolization of the two proximal AVF.  A perclose device was used to close the arteriotomy. Hemostasis was excellent upon completion.  Conscious sedation was administered with the use of IV fentanyl and midazolam under continuous physician and nurse monitoring.  Heart rate, blood pressure, and oxygen saturation were continuously monitored. Total sedation time was 94 minutes  Upon completion of the case instrument and sharps counts were confirmed correct. The patient was transferred to the PACU  in good condition. I was present for all portions of the procedure.  PLAN: Return to clinic in 1 month with repeat ABI and arterial duplex. Continue ASA / Xarelto / Statin.  [name]. Stanford Breed, MD [facility] Phone Number: [phone] 11/01/2020 4:33 PM   VAS [place] ABI WITH/WO TBI  Result Date: 10/27/2020  LOWER EXTREMITY DOPPLER STUDY Patient Name:  HUSSIEN BIBA  Date of Exam:   10/27/2020 Medical Rec #: [id number]         Accession #:    [id number] Date of Birth: May 30, 1951          Patient Gender: M Patient Age:   69Y Exam Location:  [facility] Procedure:      VAS [place] ABI WITH/WO TBI Referring Phys: [id number] [name] HAWKEN --------------------------------------------------------------------------------  Indications: Gangrene.  Vascular Interventions: 10/13/20: Right fem-peroneal BPG with insitu GSV. Performing Technologist: Ralene Cork RVT  Examination Guidelines: A complete evaluation includes at minimum, Doppler waveform signals and systolic blood pressure reading at the level of bilateral brachial, anterior tibial, and posterior tibial arteries, when vessel segments are accessible. Bilateral testing is considered an integral part of a complete examination. Photoelectric Plethysmograph (PPG) waveforms and toe systolic pressure readings are included as required and additional duplex testing as needed. Limited examinations for reoccurring indications may be performed as noted.  ABI Findings: +---------+------------------+-----+-------------------+--------+ Right    Rt Pressure (mmHg)IndexWaveform           Comment  +---------+------------------+-----+-------------------+--------+ Brachial 119                                                +---------+------------------+-----+-------------------+--------+ PTA      24                0.20 dampened monophasic         +---------+------------------+-----+-------------------+--------+ DP        0                 0.00 absent                      +---------+------------------+-----+-------------------+--------+ Great Toe                       Absent                      +---------+------------------+-----+-------------------+--------+ +---------+------------------+-----+---------+-------+ Left     Lt Pressure (mmHg)IndexWaveform Comment +---------+------------------+-----+---------+-------+ Brachial 123                                     +---------+------------------+-----+---------+-------+ PTA      165               1.34 triphasic        +---------+------------------+-----+---------+-------+ DP       168               1.37 triphasic        +---------+------------------+-----+---------+-------+ Wynelle Beckmann  0.69                  +---------+------------------+-----+---------+-------+ +-------+-----------+-----------+------------+------------+ ABI/TBIToday's ABIToday's TBIPrevious ABIPrevious TBI +-------+-----------+-----------+------------+------------+ Right  0.2        absent                              +-------+-----------+-----------+------------+------------+ Left   [place]         0.69                                +-------+-----------+-----------+------------+------------+  No previous post op ABI.  Summary: Right: Resting right ankle-brachial index indicates severe right lower extremity arterial disease. The right toe-brachial index is abnormal. Left: Resting left ankle-brachial index indicates noncompressible left lower extremity arteries. The left toe-brachial index is normal. LT Great toe pressure = 85 mmHg.  *See table(s) above for measurements and observations.  Electronically signed by Jamelle Haring on 10/27/2020 at 5:28:20 PM.    Final    VAS [place] LOWER EXTREMITY BYPASS GRAFT DUPLEX  Result Date: 10/27/2020 LOWER EXTREMITY ARTERIAL DUPLEX STUDY Patient Name:  GUNNARD RUMMELL  Date of Exam:   10/27/2020 Medical Rec #:  [id number]         Accession #:    [id number] Date of Birth: [birth year]          Patient Gender: M Patient Age:   69Y Exam Location:  [facility] Procedure:      VAS [place] LOWER EXTREMITY BYPASS GRAFT DUPLEX Referring Phys: [id number] [name] HAWKEN --------------------------------------------------------------------------------  Indications: Gangrene.  Vascular Interventions: 10/13/20: Right fem-peroneal BPG with insitu GSV. Current ABI:            Right: 0.2/absent Left: /0.69 Comparison Study: No previous post op exam. Performing Technologist: Ralene Cork RVT  Examination Guidelines: A complete evaluation includes B-mode imaging, spectral Doppler, color Doppler, and power Doppler as needed of all accessible portions of each vessel. Bilateral testing is considered an integral part of a complete examination. Limited examinations for reoccurring indications may be performed as noted.  Right Graft #1: femoral to peroneal artery +------------------+--------+--------+----------+--------------+                   PSV cm/sStenosisWaveform  Comments       +------------------+--------+--------+----------+--------------+ Inflow            79              monophasic               +------------------+--------+--------+----------+--------------+ Prox Anastomosis  116             monophasic               +------------------+--------+--------+----------+--------------+ Proximal Graft    638             monophasicat valve       +------------------+--------+--------+----------+--------------+ Mid Graft         277             monophasicat knee. valve +------------------+--------+--------+----------+--------------+ Distal Graft      43              monophasic               +------------------+--------+--------+----------+--------------+ Distal Anastomosis52              monophasic               +------------------+--------+--------+----------+--------------+  Outflow            86              monophasic               +------------------+--------+--------+----------+--------------+  Summary: Right: Elevated velocities throughout the graft at valves. Vein graft otherwise widely patent. Fluid adjacent to the graft from mid to distal thigh. Hypoechoic area adjacent to graft in the goin.  See table(s) above for measurements and observations. Electronically signed by Jamelle Haring on 10/27/2020 at 5:28:03 PM.    Final     ASSESSMENT & PLAN:   70 yo with   1) PAD with rt lower extremity arterial clot PLAN: -Advised pt that peripheral arterial disease typically occurs over a period of time. -Discussed clotting and risks. Advised pt we are trying to observe if this was a more chronic clot or acute clot that occurred suddenly.  -Advised pt that his December 2020 and January 2022 ultrasounds did show occlusion on the right side. These did indeed show progression as well. -Advised pt that his blood counts do not suggest any abnormalities such as polycythemia or Essential thrombocytosisor other MPN -Advised pt that most acquired/ inherited clotting disorders occur in the venous clot, not arterial.  -Discussed the purposes of ASA, Pletal, and Xarelto. -Advised pt that part of his clot burden was chronic and part was acute based on ultrasound findings. -Recommended OTC Lidocaine patch for numbing foot pain. -Will defer Xarelto dosing to vascular surgery but typically recommend 10 mg daily if preventive dose desired or 20 mg daily if therapeutic dose desired. -Will get labs today. -Will see back in 2 weeks via phone.    FOLLOW UP: Labs today Phone visit with Dr Irene Limbo in 2 weeks  . Orders Placed This Encounter  Procedures  . CBC with Differential/Platelet    Standing Status:   Future    Number of Occurrences:   1    Standing Expiration Date:   11/09/2021  . CMP ([facility] only)    Standing Status:   Future    Number of Occurrences:   1    Standing Expiration Date:    11/09/2021  . Antithrombin III    Standing Status:   Future    Number of Occurrences:   1    Standing Expiration Date:   11/09/2021  . Protein C activity    Standing Status:   Future    Number of Occurrences:   1    Standing Expiration Date:   11/09/2021  . Protein C, total    Standing Status:   Future    Number of Occurrences:   1    Standing Expiration Date:   11/09/2021  . Protein S activity    Standing Status:   Future    Number of Occurrences:   1    Standing Expiration Date:   11/09/2021  . Protein S, total    Standing Status:   Future    Number of Occurrences:   1    Standing Expiration Date:   11/09/2021  . Lupus anticoagulant panel    Standing Status:   Future    Number of Occurrences:   1    Standing Expiration Date:   11/09/2021  . Beta-2-glycoprotein i abs, IgG/M/A    Standing Status:   Future    Number of Occurrences:   1    Standing Expiration Date:   11/09/2021  . Homocysteine, serum    Standing  Status:   Future    Number of Occurrences:   1    Standing Expiration Date:   11/09/2021  . Factor 5 leiden    Standing Status:   Future    Number of Occurrences:   1    Standing Expiration Date:   11/09/2021  . Prothrombin gene mutation    Standing Status:   Future    Number of Occurrences:   1    Standing Expiration Date:   11/09/2021  . Cardiolipin antibodies, IgG, IgM, IgA    Standing Status:   Future    Number of Occurrences:   1    Standing Expiration Date:   11/09/2021  . RPR    Standing Status:   Future    Number of Occurrences:   1    Standing Expiration Date:   11/09/2021  . ANCA Titers    Standing Status:   Future    Number of Occurrences:   1    Standing Expiration Date:   11/09/2021  . Mpo/pr-3 (anca) antibodies    Standing Status:   Future    Number of Occurrences:   1    Standing Expiration Date:   11/09/2021  . ANA, IFA (with reflex)    Standing Status:   Future    Number of Occurrences:   1    Standing Expiration Date:   11/09/2021  . JAK2 (including V617F and  Exon 12), MPL, and CALR-Next Generation Sequencing    Standing Status:   Future    Number of Occurrences:   1    Standing Expiration Date:   11/09/2021  . HIV antibody (with reflex)    All of the patients questions were answered with apparent satisfaction. The patient knows to call the clinic with any problems, questions or concerns.  I spent 55 minutes counseling the patient face to face. The total time spent in the appointment was 80 minutes and more than 50% was on counseling and direct patient cares.    Sullivan Lone MD [place] AAHIVMS [facility] Hematology/Oncology [facility]  (Office):       [phone] (Work cell):  [phone] (Fax):           [phone]  11/09/2020 2:19 PM  I, Reinaldo Raddle, am acting as scribe for Dr. Sullivan Lone, MD.  .I have reviewed the above documentation for accuracy and completeness, and I agree with the above. Brunetta Genera MD

## 2020-11-09 ENCOUNTER — Inpatient Hospital Stay: Payer: PPO

## 2020-11-09 ENCOUNTER — Inpatient Hospital Stay: Payer: PPO | Attending: Hematology | Admitting: Hematology

## 2020-11-09 ENCOUNTER — Other Ambulatory Visit: Payer: Self-pay

## 2020-11-09 VITALS — BP 135/82 | HR 78 | Temp 98.0°F | Resp 16 | Ht 72.0 in | Wt 160.9 lb

## 2020-11-09 DIAGNOSIS — Z7982 Long term (current) use of aspirin: Secondary | ICD-10-CM | POA: Diagnosis not present

## 2020-11-09 DIAGNOSIS — I739 Peripheral vascular disease, unspecified: Secondary | ICD-10-CM | POA: Diagnosis not present

## 2020-11-09 DIAGNOSIS — I749 Embolism and thrombosis of unspecified artery: Secondary | ICD-10-CM | POA: Diagnosis not present

## 2020-11-09 DIAGNOSIS — I743 Embolism and thrombosis of arteries of the lower extremities: Secondary | ICD-10-CM

## 2020-11-09 DIAGNOSIS — Z7901 Long term (current) use of anticoagulants: Secondary | ICD-10-CM | POA: Insufficient documentation

## 2020-11-09 DIAGNOSIS — E785 Hyperlipidemia, unspecified: Secondary | ICD-10-CM | POA: Diagnosis not present

## 2020-11-09 DIAGNOSIS — Z79899 Other long term (current) drug therapy: Secondary | ICD-10-CM

## 2020-11-09 LAB — CBC WITH DIFFERENTIAL/PLATELET
Abs Immature Granulocytes: 0.02 10*3/uL (ref 0.00–0.07)
Basophils Absolute: 0.1 10*3/uL (ref 0.0–0.1)
Basophils Relative: 1 %
Eosinophils Absolute: 0.3 10*3/uL (ref 0.0–0.5)
Eosinophils Relative: 4 %
HCT: 39.4 % (ref 39.0–52.0)
Hemoglobin: 12.7 g/dL — ABNORMAL LOW (ref 13.0–17.0)
Immature Granulocytes: 0 %
Lymphocytes Relative: 25 %
Lymphs Abs: 1.9 10*3/uL (ref 0.7–4.0)
MCH: 31.6 pg (ref 26.0–34.0)
MCHC: 32.2 g/dL (ref 30.0–36.0)
MCV: 98 fL (ref 80.0–100.0)
Monocytes Absolute: 0.8 10*3/uL (ref 0.1–1.0)
Monocytes Relative: 10 %
Neutro Abs: 4.6 10*3/uL (ref 1.7–7.7)
Neutrophils Relative %: 60 %
Platelets: 253 10*3/uL (ref 150–400)
RBC: 4.02 MIL/uL — ABNORMAL LOW (ref 4.22–5.81)
RDW: 13.8 % (ref 11.5–15.5)
WBC: 7.6 10*3/uL (ref 4.0–10.5)
nRBC: 0 % (ref 0.0–0.2)

## 2020-11-09 LAB — CMP (CANCER CENTER ONLY)
ALT: 55 U/L — ABNORMAL HIGH (ref 0–44)
AST: 29 U/L (ref 15–41)
Albumin: 2.9 g/dL — ABNORMAL LOW (ref 3.5–5.0)
Alkaline Phosphatase: 102 U/L (ref 38–126)
Anion gap: 9 (ref 5–15)
BUN: 19 mg/dL (ref 8–23)
CO2: 28 mmol/L (ref 22–32)
Calcium: 8.6 mg/dL — ABNORMAL LOW (ref 8.9–10.3)
Chloride: 103 mmol/L (ref 98–111)
Creatinine: 0.86 mg/dL (ref 0.61–1.24)
GFR, Estimated: >60 mL/min (ref >60)
Glucose, Bld: 86 mg/dL (ref 70–99)
Potassium: 4.1 mmol/L (ref 3.5–5.1)
Sodium: 140 mmol/L (ref 135–145)
Total Bilirubin: 0.4 mg/dL (ref 0.3–1.2)
Total Protein: 6.5 g/dL (ref 6.5–8.1)

## 2020-11-09 LAB — HIV ANTIBODY (ROUTINE TESTING W REFLEX): HIV Screen 4th Generation wRfx: NONREACTIVE

## 2020-11-09 LAB — ANTITHROMBIN III: AntiThromb III Func: 114 % (ref 75–120)

## 2020-11-10 ENCOUNTER — Telehealth: Payer: Self-pay | Admitting: Hematology

## 2020-11-10 ENCOUNTER — Other Ambulatory Visit: Payer: Self-pay

## 2020-11-10 DIAGNOSIS — I739 Peripheral vascular disease, unspecified: Secondary | ICD-10-CM

## 2020-11-10 LAB — PROTEIN S, TOTAL: Protein S Ag, Total: 86 % (ref 60–150)

## 2020-11-10 LAB — LUPUS ANTICOAGULANT PANEL
DRVVT: 44.4 s (ref 0.0–47.0)
PTT Lupus Anticoagulant: 28.2 s (ref 0.0–51.9)

## 2020-11-10 LAB — BETA-2-GLYCOPROTEIN I ABS, IGG/M/A
Beta-2 Glyco I IgG: <9 GPI IgG units (ref 0–20)
Beta-2-Glycoprotein I IgA: <9 GPI IgA units (ref 0–25)
Beta-2-Glycoprotein I IgM: <9 GPI IgM units (ref 0–32)

## 2020-11-10 LAB — HOMOCYSTEINE: Homocysteine: 9 umol/L (ref 0.0–19.2)

## 2020-11-10 LAB — ANCA TITERS
Atypical P-ANCA titer: <1:20 {titer}
C-ANCA: <1:20 {titer}
P-ANCA: <1:20 {titer}

## 2020-11-10 LAB — RPR: RPR Ser Ql: NONREACTIVE

## 2020-11-10 LAB — PROTEIN C, TOTAL: Protein C, Total: 93 % (ref 60–150)

## 2020-11-10 LAB — CARDIOLIPIN ANTIBODIES, IGG, IGM, IGA
Anticardiolipin IgA: <9 APL U/mL (ref 0–11)
Anticardiolipin IgG: <9 GPL U/mL (ref 0–14)
Anticardiolipin IgM: <9 MPL U/mL (ref 0–12)

## 2020-11-10 LAB — PROTEIN S ACTIVITY: Protein S Activity: 88 % (ref 63–140)

## 2020-11-10 LAB — PROTEIN C ACTIVITY: Protein C Activity: 110 % (ref 73–180)

## 2020-11-10 NOTE — Telephone Encounter (Signed)
Scheduled follow-up appointment per 5/5 los. Patient is aware. ?

## 2020-11-12 LAB — MPO/PR-3 (ANCA) ANTIBODIES
ANCA Proteinase 3: <3.5 U/mL (ref 0.0–3.5)
Myeloperoxidase Abs: <9 U/mL (ref 0.0–9.0)

## 2020-11-13 LAB — ANTINUCLEAR ANTIBODIES, IFA: ANA Ab, IFA: NEGATIVE

## 2020-11-13 LAB — FACTOR 5 LEIDEN

## 2020-11-14 ENCOUNTER — Encounter (HOSPITAL_COMMUNITY): Payer: PPO

## 2020-11-15 LAB — JAK2 (INCLUDING V617F AND EXON 12), MPL,& CALR-NEXT GEN SEQ

## 2020-11-15 LAB — PROTHROMBIN GENE MUTATION

## 2020-11-24 DIAGNOSIS — K4090 Unilateral inguinal hernia, without obstruction or gangrene, not specified as recurrent: Secondary | ICD-10-CM | POA: Diagnosis not present

## 2020-11-24 NOTE — Progress Notes (Signed)
HEMATOLOGY/ONCOLOGY CONSULTATION NOTE  Date of Service: 11/27/2020  Patient Care Team: [name], DO as PCP - General (Internal Medicine) [name], MD as PCP - Cardiology (Cardiology)  CHIEF COMPLAINTS/PURPOSE OF CONSULTATION:  Peripheral Vascular disease  HISTORY OF PRESENTING ILLNESS:   [name] is a wonderful 70 y.o. male who has been referred to [place] by Reginold Agent, NP at [facility] for evaluation and management of strange presentation of complete right femoral artery occlusion and peripheral arterial disease. The pt reports that he is doing well overall. We are joined today by his wife.  The pt underwent a right common femoral to peroneal bypass on 10/13/2020 by Dr. Jamelle Haring. The pt notes that his peripheral arterial disease was diagnosed in December 2020 in the presence of pain in his lower calf upon running and performing activity for extended periods of time. The pt notes that this would go away with time and was not predictably recurrent with a set amount of activity. The pt notes he then saw Cardiologist and a Vascular Surgeon, who did not feel like the pt needed a stint or any further action at that point. The pt notes this pain occurred again in December 2021 and was worse than before. The pt notes that he would have to shake his foot at night and move around after sitting/laying for periods. The pt notes that in 2021 they only did ultrasounds and no angiograms. The pt notes that he was able to go the gym daily in 2021 until January 2022 when it got much worse. The pt notes that after the  December 2020 ultrasound, he was not told he had occlusion and still felt a faint pulse. The pt was never told he had occlusion prior to March of this year. The pt notes he was referred for evaluation due to the fact that he had total occlusion on the right and nothing on the left, which was very strange.  The pt notes that he was only experiencing pain and  ulcers prior to the surgery. The pt notes there was some color change in foot prior to surgery. In January, the pt notes his symptoms got worse much quickly and got worse as time progressed. The pt went from going to the gym everyday in January to barely being able to walk in February. The pt also notes that his leg swelling has only occurred post surgery, denying this prior to his bypass. The pt denies any history of heart issues in the past. The pt notes that on his follow up arteriogram that did not see any blockage. The pt notes that he was a smoker 40 years ago intermittently. He notes he smokes one half pack daily for the equivalent of 6-7 years. The pt denies any previous hx of blood clots or familial blood or clotting disorders. The pt notes he was adopted. The pt notes that his only past medical history is slightly higher cholesterol. The pt notes that he currently consumes around 2-3 beers and 2 bourbons every week. The pt notes this has been more in the past, but not much more. Currently, the pt denies any pain while walking. He notes his toe is still really dark on part of it as before surgery. The pt notes issues after elevating his feet, as it feels it is sticking with pins. He notes no issues when he is moving. The worst pain is when he is sitting.The pt denies pain in his calf, thigh, or  buttocks-- only the toe. The pt is currently on ASA, Xarelto, and Pletol. The pt notes he is currently on 2.5 mg Xarelto BID.  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 The pt notes that many years ago he had a piece of metal in his leg while he was in [place], but it was not deep nor were they concerned about this. The pt denies ever being on testosterone replacement and herbal supplements.   The pt notes that three years ago he had [name] spotted fever and was very flu-like sick. The pt notes they did not  actually do all the tests and treated him empirically with Doxycycline. The pt notes he had a very small tick on his side and ten days later could not get out of bed. The pt was very sick and denies having a rash.  On review of systems, pt reports toe pain, toe discoloration, leg swelling and denies new bone pains, back pain, new lumps/bumps, and any other symptoms.  INTERVAL HISTORY: I connected with [name] on 11/27/2020 by telephone and verified that I am speaking with the correct person using two identifiers.   I discussed the limitations of evaluation and management by telemedicine. The patient expressed understanding and agreed to proceed.   Other persons participating in the visit and their role in the encounter:                                                         - Reinaldo Raddle, Medical Scribe     Patient's location: Home Provider's location: [place] [facility] [place] is a wonderful 70 y.o. male who is here today for f/u regarding strange presentation of complete right femoral artery occlusion and peripheral arterial disease. The patient's last visit with [place] was on 11/09/2020. The pt reports that he is doing well overall.  The pt reports that his leg is gradually improving with decreased swelling and redness. His toes have been improving and his toenail is growing. He has a f/u visit with his surgeon within the next week.  Lab results 11/09/2020 of CBC w/diff and CMP is as follows: all values are WNL except for RBC of 4.02, Hgb of 12.7, Potassium of 5.4, Glucose of 100, Calcium of 8.6, Albumin of 2.9, ALT of 55. 11/09/2020 Homocysteine of 9.0. 11/09/2020 JAK2 negative. 11/09/2020 ANA Ab negative. 11/09/2020 Myeloperoxidase Abs of <9.0, ANCA Proteinase 3 of <3.5. 11/09/2020 ANCA Titers all <1:20. 11/09/2020 RPR Ser Ql non reactive. 11/09/2020 Anticardiolipin IgG, IgM, and IgA of <9. 11/09/2020 Prothrombin gene mutation not detected. 11/09/2020 Factor 5 leiden  not detected. 11/09/2020 Beta 2 Glyco IgM/IgG/IgA of <9. 11/09/2020 Lupus Anticoagulant not detected. 11/09/2020 Protein S Total of 86, Activity of 88. 11/09/2020 Protein C Total of 93, Activity of 110. 11/09/2020 AntiThromb III Func of 114.  On review of systems, pt denies worsening leg swelling, acute leg redness, and any other symptoms.   MEDICAL HISTORY:  Past Medical History:  Diagnosis Date  . Hyperlipidemia   . Hyperlipidemia   . PAD (peripheral artery disease) ([place])     SURGICAL HISTORY: Past Surgical History:  Procedure Laterality Date  . ABDOMINAL AORTOGRAM W/LOWER EXTREMITY [name]  09/11/2020   Procedure: ABDOMINAL AORTOGRAM W/LOWER EXTREMITY;  Surgeon: [name], MD;  Location: [place] [facility];  Service: Cardiovascular;  Laterality: [name];  . ABDOMINAL AORTOGRAM W/LOWER EXTREMITY [name] 11/01/2020   Procedure: ABDOMINAL AORTOGRAM W/LOWER EXTREMITY;  Surgeon: [name], MD;  Location: [place] [facility];  Service: Cardiovascular;  Laterality: [name];  . COLONOSCOPY     x2  . FEMORAL-TIBIAL BYPASS GRAFT Right 10/13/2020   Procedure: RIGHT COMMON FEMORAL ARTERY TO PERONEAL ARTERY BYPASS WITH INSITU GREATER SAPHENOUS VEIN;  Surgeon: [name], MD;  Location: [place];  Service: Vascular;  Laterality: Right;  . PERIPHERAL VASCULAR INTERVENTION Right 11/01/2020   Procedure: PERIPHERAL VASCULAR INTERVENTION;  Surgeon: [name], MD;  Location: [place] [facility];  Service: Cardiovascular;  Laterality: Right;  . TONSILLECTOMY    . TONSILLECTOMY AND ADENOIDECTOMY      SOCIAL HISTORY: Social History   Socioeconomic History  . Marital status: Married    Spouse name: [name]  . Number of children: [name]  . Years of education: [name]  . Highest education level: [name]  Occupational History  . [name]  Tobacco Use  . Smoking status: Never Smoker  . Smokeless tobacco: Never Used  Vaping Use  . Vaping Use: Never used  Substance and  Sexual Activity  . Alcohol use: Yes    Alcohol/week: 4.0 standard drinks    Types: 2 Cans of beer, 2 Shots of liquor per week  . Drug use: Never  . Sexual activity: [name]  Other Topics Concern  . [name]  Social History Narrative  . [name]   Social Determinants of Health   Financial Resource Strain: [name]  Food Insecurity: [name]  Transportation Needs: [name]  Physical Activity: [name]  Stress: [name]  Social Connections: [name]  Intimate Partner Violence: [name]    FAMILY HISTORY: Family History  Adopted: Yes    ALLERGIES:  is allergic to penicillins.  MEDICATIONS:  Current Outpatient Medications  Medication Sig Dispense Refill  . aspirin EC 81 MG tablet Take 81 mg by mouth daily. Swallow whole.    Marland Kitchen BIOTIN PO Take 1 tablet by mouth daily.    Marland Kitchen CALCIUM PO Take 1 tablet by mouth daily.    . cilostazol (PLETAL) 50 MG tablet Take 1 tablet (50 mg total) by mouth 2 (two) times daily. 60 tablet 6  . ezetimibe (ZETIA) 10 MG tablet Take 1 tablet (10 mg total) by mouth daily. (Patient taking differently: Take 10 mg by mouth every evening.) 90 tablet 1  . oxyCODONE-acetaminophen (PERCOCET/ROXICET) 5-325 MG tablet Take 1 tablet by mouth every 4 (four) hours as needed for moderate pain. 30 tablet 0  . rivaroxaban (XARELTO) 2.5 MG TABS tablet Take 1 tablet (2.5 mg total) by mouth 2 (two) times daily. 60 tablet 3  . rosuvastatin (CRESTOR) 40 MG tablet Take 1 tablet (40 mg total) by mouth every evening. 90 tablet 1   Current Facility-Administered Medications  Medication Dose Route Frequency Provider Last Rate Last Admin  . sodium chloride flush (NS) 0.9 % injection 3 mL  3 mL Intravenous Q12H [name], MD        REVIEW OF SYSTEMS:   10 Point review of Systems was done is negative except as noted above.  PHYSICAL EXAMINATION: ECOG PERFORMANCE STATUS: 1 - Symptomatic but  completely ambulatory  . There were no vitals  filed for this visit. There were no vitals filed for this visit. .There is no height or weight on file to calculate BMI.  Telehealth Visit.  LABORATORY DATA:  I have reviewed the data as listed  . CBC Latest Ref Rng & Units 11/09/2020 11/01/2020 10/16/2020  WBC 4.0 - 10.5 K/uL 7.6 - 12.6(H)  Hemoglobin 13.0 - 17.0 g/dL 12.7(L) 13.3 10.8(L)  Hematocrit 39.0 - 52.0 % 39.4 39.0 32.1(L)  Platelets 150 - 400 K/uL 253 - 197    . CMP Latest Ref Rng & Units 11/09/2020 11/01/2020 10/14/2020  Glucose 70 - 99 mg/dL 86 100(H) 121(H)  BUN 8 - 23 mg/dL 19 22 15   Creatinine 0.61 - 1.24 mg/dL 0.86 0.80 0.85  Sodium 135 - 145 mmol/L 140 139 136  Potassium 3.5 - 5.1 mmol/L 4.1 5.4(H) 4.2  Chloride 98 - 111 mmol/L 103 102 103  CO2 22 - 32 mmol/L 28 - 28  Calcium 8.9 - 10.3 mg/dL 8.6(L) - 7.8(L)  Total Protein 6.5 - 8.1 g/dL 6.5 - -  Total Bilirubin 0.3 - 1.2 mg/dL 0.4 - -  Alkaline Phos 38 - 126 U/L 102 - -  AST 15 - 41 U/L 29 - -  ALT 0 - 44 U/L 55(H) - -   11/09/2020 JAK2     RADIOGRAPHIC STUDIES: I have personally reviewed the radiological images as listed and agreed with the findings in the report. PERIPHERAL VASCULAR CATHETERIZATION  Result Date: 11/01/2020 DATE OF SERVICE: 11/01/2020  PATIENT:  [name]  70 y.o. male  PRE-OPERATIVE DIAGNOSIS:  Status post right fem-peroneal bypass. Postoperative ultrasound concerning for retained valve x2.  POST-OPERATIVE DIAGNOSIS:  Right fem-peroneal bypass with A-V fistula x 3  PROCEDURE:  1) US guided left common femoral artery access 2) Right lower extremity angiography with third order cannulation (19mL contrast) 3) Coil embolization of arteriovenous fistula x 2 in R fem-pop bypass (54mm Nestor coils x 4 total, 2 in each fistula) 4) Conscious sedation (94 minutes)  SURGEON:  Surgeon(s) and Role:    * [name], MD - Primary  ASSISTANT: none  An assistant was required to facilitate exposure and expedite the case.  ANESTHESIA:   local and  IV sedation  EBL: min  BLOOD ADMINISTERED:none  DRAINS: none  LOCAL MEDICATIONS USED:  LIDOCAINE  SPECIMEN:  none  COUNTS: confirmed correct.  TOURNIQUET:  None  PATIENT DISPOSITION:  PACU - hemodynamically stable.  Delay start of Pharmacological VTE agent (>24hrs) due to surgical blood loss or risk of bleeding: no  INDICATION FOR PROCEDURE: YING ROCKS is a 70 y.o. male status post right fem-peroneal bypass for gangrene. He has had severe swelling in the right leg since surgery. His postoperative ABI was 0.2. His postoperative ultrasound suggested retained valve x 2 in the bypass. After careful discussion of risks, benefits, and alternatives the patient was offered angiography. We specifically discussed access site complications. The patient understood and wished to proceed.  OPERATIVE FINDINGS: Widely patent bypass graft Three arteriovenous fistulae in bypass graft (missed side branches from in-situ bypass). Successful coil embolization of proximal two AVF. At this point I discussed the findings with three of my partners. We all felt it best to leave the last fistula given the poor bypass target. We felt the fistula might encourage patency of the bypass. Once the toe is healed we can pursue ligation of the bypass branch fistula. Good outflow noted to the foot.  DESCRIPTION OF PROCEDURE: After identification of the patient in the pre-operative holding area, the patient was transferred to the operating room. The patient was positioned supine on the operating room table. Anesthesia was induced. The groins was prepped and draped in standard fashion. A surgical pause was performed confirming correct patient, procedure, and operative location.  The right groin was anesthetized with subcutaneous injection of 1% lidocaine. Using ultrasound guidance, the right common femoral artery was accessed with micropuncture technique. Fluoroscopy was used to confirm cannulation over the femoral head. Sheathogram was  not performed. The 5131F sheath was upsized to 131F.  An 035 glidewire advantage was advanced into the distal aorta. The left common iliac artery was selected with the 035 glidewire advantage. The wire was advanced into the common femoral artery. Over the wire the omni flush catheter was advanced into the external iliac artery. Selective angiography was performed - see above for details.  The decision was made to intervene. The patient was heparinized with 7000 units of heparin. The sheath was exchanged for a 31F x 45cm sheath. Selective angiography of the left lower extremity was performed prior to intervention. Two proximal AVF were embolized with Nestor coils. I left the distal most fistula alone to encourage bypass graft patency. Completion angiography revealed successful coil embolization of the two proximal AVF.  A perclose device was used to close the arteriotomy. Hemostasis was excellent upon completion.  Conscious sedation was administered with the use of IV fentanyl and midazolam under continuous physician and nurse monitoring. Heart rate, blood pressure, and oxygen saturation were continuously monitored. Total sedation time was 94 minutes  Upon completion of the case instrument and sharps counts were confirmed correct. The patient was transferred to the PACU in good condition. I was present for all portions of the procedure.  PLAN: Return to clinic in 1 month with repeat ABI and arterial duplex. Continue ASA / Xarelto / Statin.  [name]. Stanford Breed, MD [facility] Phone Number: [phone] 11/01/2020 4:33 PM    ASSESSMENT & PLAN:   70 yo with   1) PAD with rt lower extremity arterial clot  PLAN: -Discussed pt recent labwork, 11/09/2020; blood counts normal, chemistries normal, mild elevation of ALT, Protein C and Protein S activity/total normal, Lupus negative, beta 2 glycoproteins normal, factor 5 leiden negative, Prothrombin gene mutation negative, ANA  negative. -Advised pt that his JAK2 studies came back normal with no mutation detected. -Advised pt that the lab work showed no obvious indication for a blood clotting disorder. - All genetically acquired antibodies and mutation studies came back normal and not detected. No concern for vasculitis.  -Advised pt the extensive workup showed nothing obvious for a clotting disorder or increased risk of clots. -Discussed cholesterol testing. Advised pt to f/u w PCP regarding this and management of medication. -Will defer Xarelto dosing to vascular surgery but typically recommend 10 mg daily if preventive dose desired or 20 mg daily if therapeutic dose desired. -Recommended pt eat more fresh fruits and vegetables and avoid processed foods. -Recommended pt f/u w PCP regarding dietary suggestions. -Will see back as needed.    FOLLOW UP: RTC w Dr Irene Limbo as needed   No orders of the defined types were placed in this encounter.   All of the patients questions were answered with apparent satisfaction. The patient knows to call the clinic with any problems, questions or concerns.  The total time spent in the appointment was 20 minutes and more than  50% was on counseling and direct patient cares.  .I have reviewed the above documentation for accuracy and completeness, and I agree with the above. Brunetta Genera MD    Sullivan Lone MD [place] AAHIVMS [facility] Hematology/Oncology Physician [facility]  (Office):       [phone] (Work cell):  [phone] (Fax):           [phone]  11/27/2020 4:08 PM  I, Reinaldo Raddle, am acting as scribe for Dr. Sullivan Lone, MD.  I have reviewed the above documentation for accuracy and completeness, and I agree with the above. Brunetta Genera MD

## 2020-11-26 ENCOUNTER — Encounter: Payer: Self-pay | Admitting: Hematology

## 2020-11-27 ENCOUNTER — Inpatient Hospital Stay (HOSPITAL_BASED_OUTPATIENT_CLINIC_OR_DEPARTMENT_OTHER): Payer: PPO | Admitting: Hematology

## 2020-11-27 DIAGNOSIS — I749 Embolism and thrombosis of unspecified artery: Secondary | ICD-10-CM | POA: Diagnosis not present

## 2020-11-27 DIAGNOSIS — I743 Embolism and thrombosis of arteries of the lower extremities: Secondary | ICD-10-CM | POA: Diagnosis not present

## 2020-12-05 ENCOUNTER — Ambulatory Visit (INDEPENDENT_AMBULATORY_CARE_PROVIDER_SITE_OTHER): Payer: PPO | Admitting: Vascular Surgery

## 2020-12-05 ENCOUNTER — Ambulatory Visit (INDEPENDENT_AMBULATORY_CARE_PROVIDER_SITE_OTHER)
Admission: RE | Admit: 2020-12-05 | Discharge: 2020-12-05 | Disposition: A | Discharge status: Home / Self Care | Payer: PPO | Source: Ambulatory Visit | Attending: Vascular Surgery | Admitting: Vascular Surgery

## 2020-12-05 ENCOUNTER — Ambulatory Visit (HOSPITAL_COMMUNITY)
Admission: RE | Admit: 2020-12-05 | Discharge: 2020-12-05 | Disposition: A | Discharge status: Home / Self Care | Payer: PPO | Source: Ambulatory Visit | Attending: Vascular Surgery | Admitting: Vascular Surgery

## 2020-12-05 ENCOUNTER — Other Ambulatory Visit: Payer: Self-pay

## 2020-12-05 ENCOUNTER — Encounter: Payer: Self-pay | Admitting: Vascular Surgery

## 2020-12-05 ENCOUNTER — Telehealth: Payer: Self-pay | Admitting: Cardiovascular Disease

## 2020-12-05 VITALS — BP 136/84 | HR 75 | Temp 98.2°F | Resp 20 | Ht 72.0 in | Wt 158.0 lb

## 2020-12-05 DIAGNOSIS — I739 Peripheral vascular disease, unspecified: Secondary | ICD-10-CM

## 2020-12-05 NOTE — Telephone Encounter (Signed)
Patient does not want to go to [place] office. Patient requesting to see Dr. Gardiner Rhyme.

## 2020-12-05 NOTE — Addendum Note (Signed)
Addended by: [name] on: 12/05/2020 03:40 PM   Modules accepted: Level of Service

## 2020-12-05 NOTE — Telephone Encounter (Signed)
Patient would like to switch from Dr. Gwenlyn Found to Dr. Percival Spanish.

## 2020-12-05 NOTE — Progress Notes (Signed)
[facility] [place] PROGRESS NOTE  ASSESSMENT / PLAN: [name] is a 70 y.o. 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 He continues to improve. His toe is healing. His ABI has normalized. His duplex is reassuring. He has a superficial surgical wound dehiscence which is healing with local care. Follow up with me in 1 month for wound check.  He would like to be evaluated for hyperbaric oxygen therapy, which I think is fine.  SUBJECTIVE: Doing well. Swelling improved. Toe improved. Right calf has new superficial dehiscence of surgical wound.   OBJECTIVE: BP 136/84 (BP Location: Left Arm, Patient Position: Sitting, Cuff Size: Normal)   Pulse 75   Temp 98.2 F (36.8 C)   Resp 20   Ht 6' (1.829 m)   Wt 158 lb (71.7 kg)   SpO2 100%   BMI 21.43 kg/m   2+ edema from foot to thigh Right great toe tip gangrene appears improved overall Right foot warm and well perfused  CBC Latest Ref Rng & Units 11/09/2020 11/01/2020 10/16/2020  WBC 4.0 - 10.5 K/uL 7.6 - 12.6(H)  Hemoglobin 13.0 - 17.0 g/dL 12.7(L) 13.3 10.8(L)  Hematocrit 39.0 - 52.0 % 39.4 39.0 32.1(L)  Platelets 150 - 400 K/uL 253 - 197     CMP Latest Ref Rng & Units 11/09/2020 11/01/2020 10/14/2020  Glucose 70 - 99 mg/dL 86 100(H) 121(H)  BUN 8 - 23 mg/dL 19 22 15   Creatinine 0.61 - 1.24 mg/dL 0.86 0.80 0.85  Sodium 135 - 145 mmol/L 140 139 136  Potassium 3.5 - 5.1 mmol/L 4.1 5.4(H) 4.2  Chloride 98 - 111 mmol/L 103 102 103  CO2 22 - 32 mmol/L 28 - 28  Calcium 8.9 - 10.3 mg/dL 8.6(L) - 7.8(L)  Total Protein 6.5 - 8.1 g/dL 6.5 - -  Total Bilirubin 0.3 - 1.2 mg/dL 0.4 - -  Alkaline Phos 38 - 126 U/L 102 - -  AST 15 - 41 U/L 29 - -  ALT 0 - 44 U/L 55(H) - [name]. Stanford Breed, MD [facility] Phone Number: [phone] 12/05/2020 11:37 AM

## 2020-12-06 NOTE — Telephone Encounter (Signed)
OK 

## 2020-12-07 ENCOUNTER — Other Ambulatory Visit: Payer: Self-pay

## 2020-12-07 ENCOUNTER — Encounter (HOSPITAL_BASED_OUTPATIENT_CLINIC_OR_DEPARTMENT_OTHER): Payer: PPO | Attending: Internal Medicine | Admitting: Internal Medicine

## 2020-12-07 DIAGNOSIS — Z85828 Personal history of other malignant neoplasm of skin: Secondary | ICD-10-CM | POA: Diagnosis not present

## 2020-12-07 DIAGNOSIS — T8130XA Disruption of wound, unspecified, initial encounter: Secondary | ICD-10-CM | POA: Diagnosis not present

## 2020-12-07 DIAGNOSIS — L97519 Non-pressure chronic ulcer of other part of right foot with unspecified severity: Secondary | ICD-10-CM

## 2020-12-07 DIAGNOSIS — X58XXXA Exposure to other specified factors, initial encounter: Secondary | ICD-10-CM | POA: Diagnosis not present

## 2020-12-07 DIAGNOSIS — L812 Freckles: Secondary | ICD-10-CM | POA: Diagnosis not present

## 2020-12-07 DIAGNOSIS — I739 Peripheral vascular disease, unspecified: Secondary | ICD-10-CM

## 2020-12-07 DIAGNOSIS — T8130XD Disruption of wound, unspecified, subsequent encounter: Secondary | ICD-10-CM | POA: Insufficient documentation

## 2020-12-07 DIAGNOSIS — D225 Melanocytic nevi of trunk: Secondary | ICD-10-CM | POA: Diagnosis not present

## 2020-12-07 DIAGNOSIS — L97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 with unspecified severity: Secondary | ICD-10-CM

## 2020-12-07 DIAGNOSIS — D2271 Melanocytic nevi of right lower limb, including hip: Secondary | ICD-10-CM | POA: Diagnosis not present

## 2020-12-07 DIAGNOSIS — L570 Actinic keratosis: Secondary | ICD-10-CM | POA: Diagnosis not present

## 2020-12-07 NOTE — Progress Notes (Signed)
JADARION, HALBIG ([id number]) Visit Report for 12/07/2020 Abuse/Suicide Risk Screen Details Patient Name: Date of Service: ISSA, KOSMICKI 12/07/2020 10:30 A M Medical Record Number: [id number] Patient Account Number: [id number] Date of Birth/Sex: Treating RN: 15-Feb-1951 (70 y.o. Male) [name] Primary Care Georgia Delsignore: [name] Other Clinician: Referring Brigham Cobbins: Treating Colin Ellers/Extender: Nash Dimmer, THO MA S Weeks in Treatment: 0 Abuse/Suicide Risk Screen Items Answer ABUSE RISK SCREEN: Has anyone close to you tried to hurt or harm you recentlyo No Do you feel uncomfortable with anyone in your familyo No Has anyone forced you do things that you didnt want to doo No Electronic Signature(s) Signed: 12/07/2020 5:54:05 PM By: [name] Entered By: [name] on 12/07/2020 11:18:52 -------------------------------------------------------------------------------- Activities of Daily Living Details Patient Name: Date of Service: BRAZEN, DOMANGUE 12/07/2020 10:30 A M Medical Record Number: [id number] Patient Account Number: [id number] Date of Birth/Sex: Treating RN: 07-Sep-1950 (70 y.o. Male) [name] Primary Care Lilliahna Schubring: [name] Other Clinician: Referring Anaisha Mago: Treating Brownie Nehme/Extender: Nash Dimmer, THO MA S Weeks in Treatment: 0 Activities of Daily Living Items Answer Activities of Daily Living (Please select one for each item) Drive Automobile Completely Able T Medications ake Completely Able Use T elephone Completely Able Care for Appearance Completely Able Use T oilet Completely Able Bath / Shower Completely Able Dress Self Completely Able Feed Self Completely Able Walk Completely Able Get In / Out Bed Completely Able Housework Completely Able Prepare Meals Completely [place] for Self Completely Able Electronic Signature(s) Signed: 12/07/2020 5:54:05 PM By: [name] Entered  By: [name] on 12/07/2020 11:18:37 -------------------------------------------------------------------------------- Education Screening Details Patient Name: Date of Service: [name] 12/07/2020 10:30 A M Medical Record Number: [id number] Patient Account Number: [id number] Date of Birth/Sex: Treating RN: April 24, 1951 (69 y.o. Male) [name] Primary Care Korbyn Vanes: [name] Other Clinician: Referring Bosten Newstrom: Treating Dewain Platz/Extender: Nash Dimmer, THO MA S Weeks in Treatment: 0 Primary Learner Assessed: Patient Learning Preferences/Education Level/Primary Language Learning Preference: Explanation, Demonstration, Printed Material Highest Education Level: College or Above Preferred Language: English Cognitive Barrier Language Barrier: No Translator Needed: No Memory Deficit: No Emotional Barrier: No Cultural/Religious Beliefs Affecting Medical Care: No Physical Barrier Impaired Vision: Yes Contacts Impaired Hearing: No Decreased Hand dexterity: No Knowledge/Comprehension Knowledge Level: High Comprehension Level: High Ability to understand written instructions: High Ability to understand verbal instructions: High Motivation Anxiety Level: Calm Cooperation: Cooperative Education Importance: Acknowledges Need Interest in Health Problems: Asks Questions Perception: Coherent Willingness to Engage in Self-Management High Activities: Readiness to Engage in Self-Management High Activities: Electronic Signature(s) Signed: 12/07/2020 5:54:05 PM By: [name] Entered By: [name] on 12/07/2020 11:17:32 -------------------------------------------------------------------------------- Fall Risk Assessment Details Patient Name: Date of Service: [name] 12/07/2020 10:30 A M Medical Record Number: [id number] Patient Account Number: [id number] Date of Birth/Sex: Treating RN: 1951/06/22 (70 y.o. Male) [name] Primary Care  Galilee Pierron: [name] Other Clinician: Referring Dannis Deroche: Treating Leya Paige/Extender: Nash Dimmer, THO MA S Weeks in Treatment: 0 Fall Risk Assessment Items Have you had 2 or more falls in the last 12 monthso 0 No Have you had any fall that resulted in injury in the last 12 monthso 0 No FALLS RISK SCREEN History of falling - immediate or within 3 months 0 No Secondary diagnosis (Do you have 2 or more medical diagnoseso) 0 No Ambulatory aid None/bed rest/wheelchair/nurse 0 Yes Crutches/cane/walker 0 No Furniture 0 No Intravenous therapy Access/Saline/Heparin Lock 0  No Gait/Transferring Normal/ bed rest/ wheelchair 0 Yes Weak (short steps with or without shuffle, stooped but able to lift head while walking, may seek 0 No support from furniture) Impaired (short steps with shuffle, may have difficulty arising from chair, head down, impaired 0 No balance) Mental Status Oriented to own ability 0 Yes Electronic Signature(s) Signed: 12/07/2020 5:54:05 PM By: [name] Entered By: [name] on 12/07/2020 11:17:46 -------------------------------------------------------------------------------- Foot Assessment Details Patient Name: Date of Service: [name] 12/07/2020 10:30 [place] Record Number: [id number] Patient Account Number: [id number] Date of Birth/Sex: Treating RN: December 11, 1950 (70 y.o. Male) [name] Primary Care Ogechi Kuehnel: [name] Other Clinician: Referring Lilana Blasko: Treating Adelaido Nicklaus/Extender: Nash Dimmer, THO MA S Weeks in Treatment: 0 Foot Assessment Items Site Locations + = Sensation present, - = Sensation absent, C = Callus, U = Ulcer R = Redness, W = Warmth, M = Maceration, PU = Pre-ulcerative lesion F = Fissure, S = Swelling, D = Dryness Assessment Right: Left: Other Deformity: No No Prior Foot Ulcer: No No Prior Amputation: No No Charcot Joint: No No Ambulatory Status: Ambulatory Without Help Gait:  Steady Electronic Signature(s) Signed: 12/07/2020 5:54:05 PM By: [name] Entered By: [name] on 12/07/2020 11:23:42 -------------------------------------------------------------------------------- Nutrition Risk Screening Details Patient Name: Date of Service: TAYLEN, OSORTO 12/07/2020 10:30 A M Medical Record Number: [id number] Patient Account Number: [id number] Date of Birth/Sex: Treating RN: 01/06/1951 (69 y.o. Male) [name] Primary Care Tayden Nichelson: [name] Other Clinician: Referring [place] Chagnon: Treating Asusena Sigley/Extender: Kalman Shan HA Barbette Hair, THO MA S Weeks in Treatment: 0 Height (in): 72 Weight (lbs): 162 Body Mass Index (BMI): 22 Nutrition Risk Screening Items Score Screening NUTRITION RISK SCREEN: I have an illness or condition that made me change the kind and/or amount of food I eat 0 No I eat fewer than two meals per day 0 No I eat few fruits and vegetables, or milk products 0 No I have three or more drinks of beer, liquor or wine almost every day 0 No I have tooth or mouth problems that make it hard for me to eat 0 No I don't always have enough money to buy the food I need 0 No I eat alone most of the time 0 No I take three or more different prescribed or over-the-counter drugs a day 1 Yes Without wanting to, I have lost or gained 10 pounds in the last six months 0 No I am not always physically able to shop, cook and/or feed myself 0 No Nutrition Protocols Good Risk Protocol 0 No interventions needed Moderate Risk Protocol High Risk Proctocol Risk Level: Good Risk Score: 1 Electronic Signature(s) Signed: 12/07/2020 5:54:05 PM By: [name] Entered By: [name] on 12/07/2020 11:18:02

## 2020-12-07 NOTE — Progress Notes (Signed)
[name], [name] ([id number]) Visit Report for 12/07/2020 Chief Complaint Document Details Patient Name: Date of Service: [name] 12/07/2020 10:30 A M Medical Record Number: [id number] Patient Account Number: [id number] Date of Birth/Sex: Treating RN: 04/27/1951 (69 y.o. Male) Deon Pilling Primary Care Provider: [name] Other Clinician: Referring Provider: Treating Provider/Extender: Nash Dimmer, THO MA S Weeks in Treatment: 0 Information Obtained from: Patient Chief Complaint Surgical wound dehiscence of right lower extremity and Great toe wound. Electronic Signature(s) Signed: 12/07/2020 2:43:31 PM By: Kalman Shan DO Entered By: Kalman Shan on 12/07/2020 14:26:59 -------------------------------------------------------------------------------- Debridement Details Patient Name: Date of Service: [name]. 12/07/2020 10:30 A M Medical Record Number: [id number] Patient Account Number: [id number] Date of Birth/Sex: Treating RN: Apr 22, 1951 (69 y.o. Male) Rhae Hammock Primary Care Provider: [name] Other Clinician: Referring Provider: Treating Provider/Extender: Nash Dimmer, THO MA S Weeks in Treatment: 0 Debridement Performed for Assessment: Wound #1 Right,Medial Lower Leg Performed By: Physician Kalman Shan, DO Debridement Type: Debridement Level of Consciousness (Pre-procedure): Awake and Alert Pre-procedure Verification/Time Out Yes - 11:53 Taken: Start Time: 11:53 Pain Control: Lidocaine T Area Debrided (L x W): otal 5 (cm) x 1.4 (cm) = 7 (cm) Tissue and other material debrided: Slough, Subcutaneous, Skin: Dermis , Skin: Epidermis, Slough Level: Skin/Subcutaneous Tissue Debridement Description: Excisional Instrument: Curette Bleeding: Minimum Hemostasis Achieved: Pressure End Time: 11:54 Procedural Pain: 0 Post Procedural Pain: 0 Response to Treatment: Procedure was tolerated well Level of  Consciousness (Post- Awake and Alert procedure): Post Debridement Measurements of Total Wound Length: (cm) 5 Width: (cm) 1.4 Depth: (cm) 0.4 Volume: (cm) 2.199 Character of Wound/Ulcer Post Debridement: Improved Post Procedure Diagnosis Same as Pre-procedure Electronic Signature(s) Signed: 12/07/2020 12:00:56 PM By: Rhae Hammock RN Signed: 12/07/2020 2:43:31 PM By: Kalman Shan DO Entered By: Rhae Hammock on 12/07/2020 11:54:34 -------------------------------------------------------------------------------- HPI Details Patient Name: Date of Service: [name]. 12/07/2020 10:30 A M Medical Record Number: [id number] Patient Account Number: [id number] Date of Birth/Sex: Treating RN: October 18, 1950 (70 y.o. Male) Deon Pilling Primary Care Provider: [name] Other Clinician: Referring Provider: Treating Provider/Extender: Nash Dimmer, THO MA S Weeks in Treatment: 0 History of Present Illness HPI Description: Admission 6/2 Mr. Natalio Salois is a 69 year old male with a past medical history of peripheral arterial disease status post right common femoral to peroneal bypass on 10/13/2020 and coil embolization of bypass fistula x2 on 11/01/2020. He presents today because he has wound dehiscence that occurred 3 weeks ago from his recent vascular surgery. He has been using wet-to-dry dressings to this area. He also has necrotic tissue to the right great toe. This was the original symptom that precipitated further vascular evaluation that led to his aortogram and ultimately bypass graft. He is currently keeping this area covered. He currently denies signs of infection. Electronic Signature(s) Signed: 12/07/2020 2:43:31 PM By: Kalman Shan DO Entered By: Kalman Shan on 12/07/2020 14:37:14 -------------------------------------------------------------------------------- Physical Exam Details Patient Name: Date of Service: [name] 12/07/2020 10:30 A  M Medical Record Number: [id number] Patient Account Number: [id number] Date of Birth/Sex: Treating RN: 1950/12/03 (69 y.o. Male) Deon Pilling Primary Care Provider: [name] Other Clinician: Referring Provider: Treating Provider/Extender: Nash Dimmer, THO MA S Weeks in Treatment: 0 Constitutional respirations regular, non-labored and within target range for patient.. Cardiovascular 2+ dorsalis pedis/posterior tibialis pulses. Psychiatric pleasant and cooperative. Notes Right lower extremity: 2+ pitting edema to the knee. Open wound to the medial aspect with nonviable  tissue and scant granulation tissue present. No signs of infection. Right foot: The right great toe has an eschar present. No obvious signs of infection. Electronic Signature(s) Signed: 12/07/2020 2:43:31 PM By: Kalman Shan DO Entered By: Kalman Shan on 12/07/2020 14:38:31 -------------------------------------------------------------------------------- Physician Orders Details Patient Name: Date of Service: [name]. 12/07/2020 10:30 A M Medical Record Number: [id number] Patient Account Number: [id number] Date of Birth/Sex: Treating RN: 12/09/1950 (69 y.o. 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 Heber  Return A Bathing/ Shower/ Hygiene May shower with protection but do not get wound dressing(s) wet. Edema  Control - Lymphedema / SCD / Other Elevate legs to the level of the heart or above for 30 minutes daily and/or when sitting, a frequency of: Avoid standing for long periods of time. Wound Treatment Wound #1 - Lower Leg Wound Laterality: Right, Medial Cleanser: Soap and Water 1 x Per Week/7 Days Discharge Instructions: May shower and wash wound with dial antibacterial soap and water prior to dressing change. Cleanser: Wound Cleanser (Generic) 1 x Per Week/7 Days Discharge Instructions: Cleanse the wound with wound cleanser prior to applying a clean dressing using gauze sponges, not tissue or cotton balls. Peri-Wound Care: Sween Lotion (Moisturizing lotion) 1 x Per Week/7 Days Discharge Instructions: Apply moisturizing lotion as directed Prim Dressing: Hydrofera Blue Classic Foam, 4x4 in 1 x Per Week/7 Days ary Discharge Instructions: Moisten with saline prior to applying to wound bed Prim Dressing: Santyl Ointment 1 x Per Week/7 Days ary Discharge Instructions: Apply nickel thick amount to wound bed as instructed Secondary Dressing: Woven Gauze Sponge, Non-Sterile 4x4 in (DME) (Generic) 1 x Per Week/7 Days Discharge Instructions: Apply over primary dressing as directed. Secondary Dressing: ABD Pad, 5x9 1 x Per Week/7 Days Discharge Instructions: Apply over primary dressing as directed. Compression Wrap: Kerlix Roll 4.5x3.1 (in/yd) (Generic) 1 x Per Week/7 Days Discharge Instructions: Apply Kerlix and Coban compression as directed. Compression Wrap: Coban Self-Adherent Wrap 4x5 (in/yd) (Generic) 1 x Per Week/7 Days Discharge Instructions: Apply over Kerlix as directed. Wound #2 - T Great oe Wound Laterality: Right Cleanser: Soap and Water 1 x Per Day/7 Days Discharge Instructions: May shower and wash wound with dial antibacterial soap and water prior to dressing change. Prim Dressing: betadine 1 x Per Day/7 Days ary Discharge Instructions: apply daily Electronic Signature(s) Signed:  12/07/2020 2:43:31 PM By: Kalman Shan DO Previous Signature: 12/07/2020 12:00:56 PM Version By: Rhae Hammock RN Entered By: Kalman Shan on 12/07/2020 14:38:52 -------------------------------------------------------------------------------- Problem List Details Patient Name: Date of Service: [name]. 12/07/2020 10:30 A M Medical Record Number: [id number] Patient Account Number: [id number] Date of Birth/Sex: Treating RN: 16-Aug-1950 (69 y.o. 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 12/07/2020 10:30 A M Medical Record Number: [id number] Patient Account Number: [id number] Date of Birth/Sex: Treating RN: June 29, 1951 (69 y.o. Male) Deon Pilling Primary Care Provider: [name] Other Clinician: Referring Provider: Treating Provider/Extender: Nash Dimmer, THO MA S Weeks in Treatment: 0 Subjective Chief Complaint Information obtained from Patient Surgical wound dehiscence of right lower extremity and Great toe wound. History of Present Illness (HPI) Admission 6/2 Mr. [name] is a 70 year old male with a past medical history of peripheral arterial disease status post right common femoral to peroneal bypass on 10/13/2020 and coil embolization of bypass fistula x2 on 11/01/2020. He presents today because he has wound dehiscence that occurred 3 weeks ago from his recent vascular surgery. He has been using wet-to-dry dressings to this area. He also has necrotic tissue to the right great toe. This was the original symptom that precipitated further vascular evaluation that led to his aortogram and ultimately bypass graft. He is currently keeping this area covered. He currently denies signs of infection. Patient History Information obtained from Patient. Allergies penicillin Family History Unknown History. Social History Never smoker, Marital Status - Married, Alcohol Use - Moderate, Drug Use - No History, Caffeine Use - Daily. Medical History Cardiovascular Patient has history of Coronary Artery Disease, Peripheral Arterial Disease, Peripheral Venous Disease Review of Systems (ROS) Eyes Complains or has symptoms of Glasses / Contacts. Ear/Nose/Mouth/Throat Denies complaints or symptoms of Chronic sinus problems or rhinitis. Respiratory Denies complaints or symptoms of Chronic or frequent coughs, Shortness of Breath. Gastrointestinal Denies complaints or symptoms of Frequent diarrhea, Nausea, Vomiting. Endocrine Denies complaints or symptoms of Heat/cold intolerance. Genitourinary Denies complaints or symptoms of Frequent urination. Integumentary (Skin) Complains or has symptoms of Wounds. Musculoskeletal Denies complaints or symptoms of Muscle Pain, Muscle Weakness. Neurologic Denies complaints or symptoms of Numbness/parasthesias. Psychiatric Denies complaints or symptoms of Claustrophobia, Suicidal. Objective Constitutional respirations regular, non-labored and within target range for patient.. Vitals Time Taken: 11:13 AM, Height: 72 in, Source:  Stated, Weight: 162 lbs, Source: Stated, BMI: 22, Temperature: 98.4 F, Pulse: 80 bpm, Respiratory Rate: 16 breaths/min, Blood Pressure: 111/70 mmHg. Cardiovascular 2+ dorsalis pedis/posterior tibialis pulses. Psychiatric pleasant and cooperative. General Notes: Right lower extremity: 2+ pitting edema to the knee. Open wound to the medial aspect with nonviable tissue and scant granulation tissue present. No signs of infection. Right foot: The right great toe has an eschar present. No obvious signs of infection. Integumentary (Hair, Skin) Wound #1 status is Open. Original cause of wound was Surgical Injury. The date acquired was: 11/16/2020. The wound is located on the Right,Medial Lower Leg. The wound measures 5cm length x 1.4cm width x 0.4cm depth; 5.498cm^2 area and 2.199cm^3 volume. There is Fat Layer (Subcutaneous Tissue) exposed. There is no tunneling or undermining noted. There is a medium amount of serosanguineous drainage noted. The wound margin is well defined and not attached to the wound base. There is medium (34-66%) red granulation within the wound bed. There is a medium (34-66%) amount of necrotic tissue within the wound bed including Adherent Slough. Wound #2 status is Open. Original cause of wound was Gradually Appeared. The date acquired was: 09/18/2020. The wound  is located on the Right T Great. oe The wound measures 1.3cm length x 1.7cm width x 0.3cm depth; 1.736cm^2 area and 0.521cm^3 volume. There is Fat Layer (Subcutaneous Tissue) exposed. There is no tunneling or undermining noted. There is a medium amount of serosanguineous drainage noted. The wound margin is distinct with the outline attached to the wound base. There is no granulation within the wound bed. There is a large (67-100%) amount of necrotic tissue within the wound bed including Eschar. 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 This was debrided and granulation tissue present. No signs of infection. He does have noticeable swelling and I think would benefit from some light compression along with a dressing. I recommended Santyl with Hydrofera Blue under Kerlix/Coban. His ABI currently on the right is normal. If he has any issues he knows to call us and take the wrap off. For his right great toe I recommended Betadine and the necrotic tissue should eventually fall off. I do not want to disrupt this area as his TBI 0.26. I will see him early next week. Procedures Wound #1 Pre-procedure diagnosis of Wound #1 is a Dehisced Wound located on the Right,Medial Lower Leg . There was a Excisional Skin/Subcutaneous Tissue Debridement with a total area of 7 sq cm performed by Kalman Shan, DO. With the following instrument(s): Curette Material removed includes Subcutaneous Tissue, Slough, Skin: Dermis, and Skin: Epidermis after achieving pain control using Lidocaine. No specimens were taken. A time out was conducted at 11:53, prior to the start of the procedure. A Minimum amount of bleeding was controlled with Pressure. The procedure was tolerated well with a pain level of 0 throughout and a pain level of 0 following the procedure. Post Debridement Measurements: 5cm length x 1.4cm width x 0.4cm depth; 2.199cm^3 volume. Character of Wound/Ulcer Post Debridement is improved. Post procedure Diagnosis Wound #1: Same as Pre-Procedure Plan Follow-up Appointments: Return Appointment in: - on Monday or Tuesday with Dr. Heber [place] Bathing/ Shower/ Hygiene: May shower with protection but do not get wound dressing(s) wet. Edema Control - Lymphedema / SCD / Other: Elevate legs to the level of the  heart or above for 30 minutes daily and/or when sitting, a frequency of: Avoid standing for long periods of time. WOUND #1: - Lower Leg Wound Laterality: Right, Medial Cleanser: Soap and Water 1 x Per Week/7 Days Discharge Instructions: May shower and wash wound with dial antibacterial soap and water prior to dressing change. Cleanser: Wound Cleanser (Generic) 1 x Per Week/7 Days Discharge Instructions: Cleanse the wound with wound cleanser prior to applying a clean dressing using gauze sponges, not tissue or cotton balls. Peri-Wound Care: Sween Lotion (Moisturizing lotion) 1 x Per Week/7 Days Discharge Instructions: Apply moisturizing lotion as directed Prim Dressing: Hydrofera Blue Classic Foam, 4x4 in 1 x Per Week/7 Days ary Discharge Instructions: Moisten with saline prior to applying to wound bed Prim Dressing: Santyl Ointment 1 x Per Week/7 Days ary Discharge Instructions: Apply nickel thick amount to wound bed as instructed Secondary Dressing: Woven Gauze Sponge, Non-Sterile 4x4 in (DME) (Generic) 1 x Per Week/7 Days Discharge Instructions: Apply over primary dressing as directed. Secondary Dressing: ABD Pad, 5x9 1 x Per Week/7 Days Discharge Instructions: Apply over primary dressing as directed.  Com pression Wrap: Kerlix Roll 4.5x3.1 (in/yd) (Generic) 1 x Per Week/7 Days Discharge Instructions: Apply Kerlix and Coban compression as directed. Com pression Wrap: Coban Self-Adherent Wrap 4x5 (in/yd) (Generic) 1 x Per Week/7 Days Discharge Instructions: Apply over Kerlix as directed. WOUND #2: - T Great Wound Laterality: Right oe Cleanser: Soap and Water 1 x Per Day/7 Days Discharge Instructions: May shower and wash wound with dial antibacterial soap and water prior to dressing change. Prim Dressing: betadine 1 x Per Day/7 Days ary Discharge Instructions: apply daily 1. Kerlix/Coban with Hydrofera Blue and Santyl underneath 2. Follow-up Next week 3. Betadine to the right great  toe Electronic Signature(s) Signed: 12/07/2020 2:43:31 PM By: Kalman Shan DO Entered By: Kalman Shan on 12/07/2020 14:42:05 -------------------------------------------------------------------------------- HxROS Details Patient Name: Date of Service: [name]. 12/07/2020 10:30 A M Medical Record Number: [id number] Patient Account Number: [id number] Date of Birth/Sex: Treating RN: 06-03-1951 (69 y.o. Male) [name] Primary Care Provider: [name] Other Clinician: Referring Provider: Treating Provider/Extender: Nash Dimmer, THO MA S Weeks in Treatment: 0 Information Obtained From Patient Eyes Complaints and Symptoms: Positive for: Glasses / Contacts Ear/Nose/Mouth/Throat Complaints and Symptoms: Negative for: Chronic sinus problems or rhinitis Respiratory Complaints and Symptoms: Negative for: Chronic or frequent coughs; Shortness of Breath Gastrointestinal Complaints and Symptoms: Negative for: Frequent diarrhea; Nausea; Vomiting Endocrine Complaints and Symptoms: Negative for: Heat/cold intolerance Genitourinary Complaints and Symptoms: Negative for: Frequent urination Integumentary (Skin) Complaints and Symptoms: Positive for: Wounds Musculoskeletal Complaints and Symptoms: Negative for: Muscle Pain; Muscle Weakness Neurologic Complaints and Symptoms: Negative for: Numbness/parasthesias Psychiatric Complaints and Symptoms: Negative for: Claustrophobia; Suicidal Hematologic/Lymphatic Cardiovascular Medical History: Positive for: Coronary Artery Disease; Peripheral Arterial Disease; Peripheral Venous Disease Immunological Oncologic Immunizations Pneumococcal Vaccine: Received Pneumococcal Vaccination: Yes Implantable Devices None Family and Social History Unknown History: Yes; Never smoker; Marital Status - Married; Alcohol Use: Moderate; Drug Use: No History; Caffeine Use: Daily; Financial Concerns: No; Food, Clothing  or Shelter Needs: No; Support System Lacking: No; Transportation Concerns: No Electronic Signature(s) Signed: 12/07/2020 2:43:31 PM By: Kalman Shan DO Signed: 12/07/2020 5:54:05 PM By: [name] Entered By: [name] on 12/07/2020 11:22:00 -------------------------------------------------------------------------------- SuperBill Details Patient Name: Date of Service: [name] 12/07/2020 Medical Record Number: [id number] Patient Account Number: [id number] Date of Birth/Sex: Treating RN: 1950/09/06 (69 y.o. 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 I73.9 Peripheral vascular disease, unspecified Facility Procedures CPT4 Code: [id number] Description: 99213 - WOUND CARE VISIT-LEV 3 EST PT Modifier: Quantity: 1 CPT4 Code: [id number] Description: 11042 - DEB SUBQ TISSUE 20 SQ CM/< ICD-10 Diagnosis Description T81.30XA Disruption of wound, unspecified, initial encounter 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 with unspecified sev Modifier: erity Quantity: 1 Physician Procedures : CPT4 Code Description Modifier [id number] WC PHYS LEVEL 3 NEW PT ICD-10 Diagnosis Description T81.30XA Disruption of wound, unspecified, initial encounter 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 with unspecified severity  L97.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

## 2020-12-07 NOTE — Progress Notes (Signed)
Cardiology Office Note:    Date:  12/08/2020   ID:  Omar, Gayden January 21, 1951, MRN [id number]  PCP:  [name], DO  Cardiologist:  Quay Burow, MD  Electrophysiologist:  None   Referring MD: [name], MD   Chief Complaint  Patient presents with  . Follow-up  . PAD    History of Present Illness:    [name] is a 70 y.o. male with a hx of PAD, hypertension, hyperlipidemia who presents for follow-up.  Reported claudication symptoms, underwent peripheral angiography with Dr. Gwenlyn Found on 09/11/2020.  This showed occluded distal right common femoral, profunda femoris, SFA, popliteal and tibial vessels.  He was referred to vascular surgery and underwent right common femoral artery to peroneal artery bypass on 11/01/2020 with Dr. Stanford Breed.  Most recent ABIs on 12/05/2020 were normal.  Calcium score on 06/30/2019 was 43 (31st percentile).    Since last clinic visit, reports he is doing well.  Claudication symptoms have improved since his surgery.  Denies any chest pain, dyspnea, lightheadedness, syncope, or palpitations.  Saw wound clinic yesterday for wound in RLE.  Taking Xarelto, denies any bleeding issues.  Has been riding stationary bike, for 1 hour at a time. Denies any exertional symptoms.    Past Medical History:  Diagnosis Date  . Hyperlipidemia   . Hyperlipidemia   . PAD (peripheral artery disease) ([place])     Past Surgical History:  Procedure Laterality Date  . ABDOMINAL AORTOGRAM W/LOWER EXTREMITY [name] 09/11/2020   Procedure: ABDOMINAL AORTOGRAM W/LOWER EXTREMITY;  Surgeon: [name], MD;  Location: [facility];  Service: Cardiovascular;  Laterality: [name];  . ABDOMINAL AORTOGRAM W/LOWER EXTREMITY [name] 11/01/2020   Procedure: ABDOMINAL AORTOGRAM W/LOWER EXTREMITY;  Surgeon: [name], MD;  Location: [facility];  Service: Cardiovascular;  Laterality: [name];  . COLONOSCOPY     x2  . FEMORAL-TIBIAL BYPASS GRAFT Right 10/13/2020    Procedure: RIGHT COMMON FEMORAL ARTERY TO PERONEAL ARTERY BYPASS WITH INSITU GREATER SAPHENOUS VEIN;  Surgeon: [name], MD;  Location: [facility];  Service: Vascular;  Laterality: Right;  . PERIPHERAL VASCULAR INTERVENTION Right 11/01/2020   Procedure: PERIPHERAL VASCULAR INTERVENTION;  Surgeon: [name], MD;  Location: [facility];  Service: Cardiovascular;  Laterality: Right;  . TONSILLECTOMY    . TONSILLECTOMY AND ADENOIDECTOMY      Current Medications: Current Meds  Medication Sig  . aspirin EC 81 MG tablet Take 81 mg by mouth daily. Swallow whole.  Marland Kitchen BIOTIN PO Take 1 tablet by mouth daily.  Marland Kitchen CALCIUM PO Take 1 tablet by mouth daily.  . cilostazol (PLETAL) 50 MG tablet Take 1 tablet (50 mg total) by mouth 2 (two) times daily.  Marland Kitchen ezetimibe (ZETIA) 10 MG tablet Take 1 tablet (10 mg total) by mouth daily. (Patient taking differently: Take 10 mg by mouth every evening.)  . oxyCODONE-acetaminophen (PERCOCET/ROXICET) 5-325 MG tablet Take 1 tablet by mouth every 4 (four) hours as needed for moderate pain.  . rivaroxaban (XARELTO) 2.5 MG TABS tablet Take 1 tablet (2.5 mg total) by mouth 2 (two) times daily.  . rosuvastatin (CRESTOR) 40 MG tablet Take 1 tablet (40 mg total) by mouth every evening.   Current Facility-Administered Medications for the 12/08/20 encounter (Office Visit) with Donato Heinz, MD  Medication  . sodium chloride flush (NS) 0.9 % injection 3 mL     Allergies:   Penicillins   Social History   Socioeconomic History  .  Marital status: Married    Spouse name: [name]  . Number of children: [name]  . Years of education: [name]  . Highest education level: [name]  Occupational History  . [name]  Tobacco Use  . Smoking status: Never Smoker  . Smokeless tobacco: Never Used  Vaping Use  . Vaping Use: Never used  Substance and Sexual Activity  . Alcohol use: Yes    Alcohol/week: 4.0 standard drinks    Types: 2 Cans of  beer, 2 Shots of liquor per week  . Drug use: Never  . Sexual activity: [name]  Other Topics Concern  . [name]  Social History Narrative  . [name]   Social Determinants of Health   Financial Resource Strain: [name]  Food Insecurity: [name]  Transportation Needs: [name]  Physical Activity: [name]  Stress: [name]  Social Connections: [name]     Family History: The patient's family history is not on file. He was adopted.  ROS:   Please see the history of present illness.     All other systems reviewed and are negative.  EKGs/Labs/Other Studies Reviewed:    The following studies were reviewed today:   EKG:  EKG is not ordered today.  Recent Labs: 11/09/2020: ALT 55; BUN 19; Creatinine 0.86; Hemoglobin 12.7; Platelets 253; Potassium 4.1; Sodium 140  Recent Lipid Panel    Component Value Date/Time   CHOL 127 10/06/2020 1210   TRIG 72 10/06/2020 1210   HDL 46 10/06/2020 1210   CHOLHDL 2.8 10/06/2020 1210   LDLCALC 66 10/06/2020 1210    Physical Exam:    VS:  BP 122/76 (BP Location: Left Arm, Patient Position: Sitting, Cuff Size: Normal)   Pulse 72   Ht 6' (1.829 m)   Wt 162 lb (73.5 kg)   BMI 21.97 kg/m     Wt Readings from Last 3 Encounters:  12/08/20 162 lb (73.5 kg)  12/05/20 158 lb (71.7 kg)  11/09/20 160 lb 14.4 oz (73 kg)     GEN: Well nourished, well developed in no acute distress HEENT: Normal NECK: No JVD; No carotid bruits LYMPHATICS: No lymphadenopathy CARDIAC: RRR, no murmurs, rubs, gallops RESPIRATORY:  Clear to auscultation without rales, wheezing or rhonchi  ABDOMEN: Soft, non-tender, non-distended MUSCULOSKELETAL:  RLE wrapped SKIN: Warm and dry NEUROLOGIC:  Alert and oriented x 3 PSYCHIATRIC:  Normal affect   ASSESSMENT:    1. PAD (peripheral artery disease) (H[facility])   2. Elevated coronary artery calcium score   3. Mixed hyperlipidemia    PLAN:    PAD: Reported claudication symptoms,  underwent peripheral angiography with Dr. Gwenlyn Found on 09/11/2020.  This showed occluded distal right common femoral, profunda femoris, SFA, popliteal and tibial vessels.  He was referred to vascular surgery and underwent right common femoral artery to peroneal artery bypass on 11/01/2020 with Dr. Stanford Breed.  Most recent ABIs on 12/05/2020 were normal. -Continue Pletal -Continue Zetia and rosuvastatin 40 mg daily.  LDL 66 on 10/06/2020, at goal less than 70 -Continue Xarelto 2.5 mg daily  CAD: Calcium score on 06/30/2019 was 43 (31st percentile).   -Continue Zetia and rosuvastatin  Hyperlipidemia: Continue Zetia 10 mg daily and rosuvastatin 40 mg daily.  LDL 66 on 10/06/2020, at goal less than 70  RTC in 1 year  Medication Adjustments/Labs and Tests Ordered: Current medicines are reviewed at length with the  patient today.  Concerns regarding medicines are outlined above.  No orders of the defined types were placed in this encounter.  No orders of the defined types were placed in this encounter.   There are no Patient Instructions on file for this visit.   Signed, Donato Heinz, MD  12/08/2020 8:28 AM    [facility]

## 2020-12-08 ENCOUNTER — Other Ambulatory Visit: Payer: Self-pay

## 2020-12-08 ENCOUNTER — Ambulatory Visit: Payer: PPO | Admitting: Cardiology

## 2020-12-08 ENCOUNTER — Encounter: Payer: Self-pay | Admitting: Cardiology

## 2020-12-08 VITALS — BP 122/76 | HR 72 | Ht 72.0 in | Wt 162.0 lb

## 2020-12-08 DIAGNOSIS — T8130XA Disruption of wound, unspecified, initial encounter: Secondary | ICD-10-CM | POA: Diagnosis not present

## 2020-12-08 DIAGNOSIS — E782 Mixed hyperlipidemia: Secondary | ICD-10-CM

## 2020-12-08 DIAGNOSIS — I739 Peripheral vascular disease, unspecified: Secondary | ICD-10-CM | POA: Diagnosis not present

## 2020-12-08 DIAGNOSIS — L97519 Non-pressure chronic ulcer of other part of right foot with unspecified severity: Secondary | ICD-10-CM | POA: Diagnosis not present

## 2020-12-08 DIAGNOSIS — R931 Abnormal findings on diagnostic imaging of heart and coronary circulation: Secondary | ICD-10-CM

## 2020-12-08 DIAGNOSIS — L97911 Non-pressure chronic ulcer of unspecified part of right lower leg limited to breakdown of skin: Secondary | ICD-10-CM | POA: Diagnosis not present

## 2020-12-08 NOTE — Progress Notes (Signed)
[name], [name] ([id number]) Visit Report for 12/07/2020 Allergy List Details Patient Name: Date of Service: [name], [name] 12/07/2020 10:30 A M Medical Record Number: [id number] Patient Account Number: [id number] Date of Birth/Sex: Treating RN: 19-Mar-1951 (69 y.o. Male) [name] Primary Care Shaun Runyon: [name] Other Clinician: Referring Calib Wadhwa: Treating Kaiulani Sitton/Extender: Nash Dimmer, THO MA S Weeks in Treatment: 0 Allergies Active Allergies penicillin Allergy Notes Electronic Signature(s) Signed: 12/07/2020 5:54:05 PM By: [name] Entered By: [name] on 12/07/2020 11:16:53 -------------------------------------------------------------------------------- Arrival Information Details Patient Name: Date of Service: [name] 12/07/2020 10:30 A M Medical Record Number: [id number] Patient Account Number: [id number] Date of Birth/Sex: Treating RN: [birth year] (69 y.o. Male) [name] Primary Care Cherokee Boccio: [name] Other Clinician: Referring Victorious Cosio: Treating Jazper Nikolai/Extender: Nash Dimmer, THO MA S Weeks in Treatment: 0 Visit Information Patient Arrived: Ambulatory Arrival Time: 11:08 Transfer Assistance: None Patient Identification Verified: Yes Secondary Verification Process Completed: Yes Patient Requires Transmission-Based Precautions: No Patient Has Alerts: Yes Patient Alerts: Patient on Blood Thinner ABI R=0.95 ABI L=1.21 Electronic Signature(s) Signed: 12/07/2020 5:54:05 PM By: [name] Entered By: [name] on 12/07/2020 11:13:53 -------------------------------------------------------------------------------- Clinic Level of Care Assessment Details Patient Name: Date of Service: [name], [name] 12/07/2020 10:30 A M Medical Record Number: [id number] Patient Account Number: [id number] Date of Birth/Sex: Treating RN: [birth year] (69 y.o. Male) Rhae Hammock Primary Care Deion Forgue: [name]  Other Clinician: Referring Aliha Diedrich: Treating Nelida Mandarino/Extender: Nash Dimmer, THO MA S Weeks in Treatment: 0 Clinic Level of Care Assessment Items TOOL 1 Quantity Score X- 1 0 Use when EandM and Procedure is performed on INITIAL visit ASSESSMENTS - Nursing Assessment / Reassessment X- 1 20 General Physical Exam (combine w/ comprehensive assessment (listed just below) when performed on new pt. evals) X- 1 25 Comprehensive Assessment (HX, ROS, Risk Assessments, Wounds Hx, etc.) ASSESSMENTS - Wound and Skin Assessment / Reassessment X- 1 10 Dermatologic / Skin Assessment (not related to wound area) ASSESSMENTS - Ostomy and/or Continence Assessment and Care []  - 0 Incontinence Assessment and Management []  - 0 Ostomy Care Assessment and Management (repouching, etc.) PROCESS - Coordination of Care X - Simple Patient / Family Education for ongoing care 1 15 []  - 0 Complex (extensive) Patient / Family Education for ongoing care X- 1 10 Staff obtains Programmer, systems, Records, T Results / Process Orders est []  - 0 Staff telephones HHA, Nursing Homes / Clarify orders / etc []  - 0 Routine Transfer to another Facility (non-emergent condition) []  - 0 Routine Hospital Admission (non-emergent condition) X- 1 15 New Admissions / Biomedical engineer / Ordering NPWT Apligraf, etc. , []  - 0 Emergency Hospital Admission (emergent condition) PROCESS - Special Needs []  - 0 Pediatric / Minor Patient Management []  - 0 Isolation Patient Management []  - 0 Hearing / Language / Visual special needs []  - 0 Assessment of Community assistance (transportation, D/C planning, etc.) []  - 0 Additional assistance / Altered mentation []  - 0 Support Surface(s) Assessment (bed, cushion, seat, etc.) INTERVENTIONS - Miscellaneous []  - 0 External ear exam []  - 0 Patient Transfer (multiple staff / Civil Service fast streamer / Similar devices) []  - 0 Simple Staple / Suture removal (25 or less) []  -  0 Complex Staple / Suture removal (26 or more) []  - 0 Hypo/Hyperglycemic Management (do not check if billed separately) []  - 0 Ankle / Brachial Index (ABI) - do not check if billed separately Has the patient been seen at the hospital within the  last three years: Yes Total Score: 95 Level Of Care: New/Established - Level 3 Electronic Signature(s) Signed: 12/07/2020 12:00:56 PM By: Rhae Hammock RN Entered By: Rhae Hammock on 12/07/2020 11:55:24 -------------------------------------------------------------------------------- Lower Extremity Assessment Details Patient Name: Date of Service: [name]. 12/07/2020 10:30 A M Medical Record Number: [id number] Patient Account Number: [id number] Date of Birth/Sex: Treating RN: 1951/01/18 (69 y.o. Male) [name] Primary Care Dhanya Bogle: [name] Other Clinician: Referring Freedom Lopezperez: Treating Mikhai Bienvenue/Extender: Kalman Shan HA Barbette Hair, THO MA S Weeks in Treatment: 0 Edema Assessment Assessed: [Left: No] [Right: Yes] Edema: [Left: Ye] [Right: s] Calf Left: Right: Point of Measurement: From Medial Instep 38.8 cm Ankle Left: Right: Point of Measurement: From Medial Instep 26 cm Vascular Assessment Pulses: Dorsalis Pedis Palpable: [Right:Yes] Notes ABI's done at [facility] 12/05/20 Electronic Signature(s) Signed: 12/07/2020 5:54:05 PM By: [name] Entered By: [name] on 12/07/2020 11:25:27 -------------------------------------------------------------------------------- Multi Wound Chart Details Patient Name: Date of Service: [name]. 12/07/2020 10:30 A M Medical Record Number: [id number] Patient Account Number: [id number] Date of Birth/Sex: Treating RN: 09/16/1950 (70 y.o. Male) Deon Pilling Primary Care Stephane Junkins: [name] Other Clinician: Referring Giannina Bartolome: Treating Treyshon Buchanon/Extender: Nash Dimmer, THO MA S Weeks in Treatment: 0 Vital Signs Height(in): 72 Pulse(bpm):  80 Weight(lbs): 162 Blood Pressure(mmHg): 111/70 Body Mass Index(BMI): 22 Temperature(F): 98.4 Respiratory Rate(breaths/min): 16 Photos: [1:No Photos Right, Medial Lower Leg] [2:No Photos Right T Great oe] [[name]:[name] [name]] Wound Location: [1:Surgical Injury] [2:Gradually Appeared] [[name]:[name]] Wounding Event: [1:Dehisced Wound] [2:Arterial Insufficiency Ulcer] [[name]:[name]] Primary Etiology: [1:Coronary Artery Disease, Peripheral] [2:Coronary Artery Disease, Peripheral] [[name]:[name]] Comorbid History: [1:Arterial Disease, Peripheral Venous Disease 11/16/2020] [2:Arterial Disease, Peripheral Venous Disease 09/18/2020] [[name]:[name]] Date Acquired: [1:0] [2:0] [[name]:[name]] Weeks of Treatment: [1:Open] [2:Open] [[name]:[name]] Wound Status: [1:5x1.4x0.4] [2:1.3x1.7x0.3] [[name]:[name]] Measurements L x W x D (cm) [1:5.498] [2:1.736] [[name]:[name]] A (cm) : rea [1:2.199] [2:0.521] [[name]:[name]] Volume (cm) : [1:Full Thickness Without Exposed] [2:Full Thickness Without Exposed] [[name]:[name]] Classification: [1:Support Structures Medium] [2:Support Structures Medium] [[name]:[name]] Exudate A mount: [1:Serosanguineous] [2:Serosanguineous] [[name]:[name]] Exudate Type: [1:red, brown] [2:red, brown] [[name]:[name]] Exudate Color: [1:Well defined, not attached] [2:Distinct, outline attached] [[name]:[name]] Wound Margin: [1:Medium (34-66%)] [2:None Present (0%)] [[name]:[name]] Granulation A mount: [1:Red] [2:[name]] [[name]:[name]] Granulation Quality: [1:Medium (34-66%)] [2:Large (67-100%)] [[name]:[name]] Necrotic A mount: [1:Adherent Slough] [2:Eschar] [[name]:[name]] Necrotic Tissue: [1:Fat Layer (Subcutaneous Tissue): Yes Fat Layer (Subcutaneous Tissue): Yes [name]] Exposed Structures: [1:Fascia: No Tendon: No Muscle: No Joint: No Bone: No None] [2:Fascia: No Tendon: No Muscle: No Joint: No Bone: No None] [[name]:[name]] Epithelialization: [1:Debridement - Excisional] [2:[name]] [[name]:[name]] Debridement: Pre-procedure Verification/Time Out 11:53 [2:[name]] [[name]:[name]] Taken: [1:Lidocaine] [2:[name]]  [[name]:[name]] Pain Control: [1:Subcutaneous, Slough] [2:[name]] [[name]:[name]] Tissue Debrided: [1:Skin/Subcutaneous Tissue] [2:[name]] [[name]:[name]] Level: [1:7] [2:[name]] [[name]:[name]] Debridement A (sq cm): [1:rea Curette] [2:[name]] [[name]:[name]] Instrument: [1:Minimum] [2:[name]] [[name]:[name]] Bleeding: [1:Pressure] [2:[name]] [[name]:[name]] Hemostasis A chieved: [1:0] [2:[name]] [[name]:[name]] Procedural Pain: [1:0] [2:[name]] [[name]:[name]] Post Procedural Pain: [1:Procedure was tolerated well] [2:[name]] [[name]:[name]] Debridement Treatment Response: [1:5x1.4x0.4] [2:[name]] [[name]:[name]] Post Debridement Measurements L x W x D (cm) [1:2.199] [2:[name]] [[name]:[name]] Post Debridement Volume: (cm) [1:Debridement] [2:[name]] [[name]:[name]] Treatment Notes Electronic Signature(s) Signed: 12/07/2020 2:43:31 PM By: Kalman Shan DO Signed: 12/07/2020 5:16:10 PM By: Deon Pilling Entered By: Kalman Shan on 12/07/2020 14:25:58 -------------------------------------------------------------------------------- [place] Details Patient Name: Date of Service: [name]. 12/07/2020 10:30 A M Medical Record Number: [id number] Patient Account Number: [id number] Date of Birth/Sex: Treating RN: [birth year] (70 y.o. Male) Rhae Hammock Primary Care Kentley Blyden: [name] Other Clinician: Referring Paulette Lynch: Treating Shay Jhaveri/Extender:  Heber [place]us, Jessica HA WKEN, THO MA S Weeks in Treatment: 0 Active Inactive Orientation to the Wound Care Program Nursing Diagnoses: Knowledge deficit related to the wound healing center program Goals: Patient/caregiver will verbalize understanding of the [place] Program Date Initiated: 12/07/2020 Target Resolution Date: 01/13/2021 Goal Status: Active Interventions: Provide education on orientation to the wound center Notes: Wound/Skin Impairment Nursing Diagnoses: Impaired tissue integrity Knowledge deficit related to ulceration/compromised skin integrity Goals: Patient will have a decrease in wound  volume by X% from date: (specify in notes) Date Initiated: 12/07/2020 Target Resolution Date: 01/13/2021 Goal Status: Active Patient/caregiver will verbalize understanding of skin care regimen Date Initiated: 12/07/2020 Target Resolution Date: 01/11/2021 Goal Status: Active Ulcer/skin breakdown will have a volume reduction of 30% by week 4 Date Initiated: 12/07/2020 Target Resolution Date: 01/13/2021 Goal Status: Active Interventions: Assess patient/caregiver ability to obtain necessary supplies Assess patient/caregiver ability to perform ulcer/skin care regimen upon admission and as needed Assess ulceration(s) every visit Notes: Electronic Signature(s) Signed: 12/07/2020 12:00:56 PM By: Rhae Hammock RN Entered By: Rhae Hammock on 12/07/2020 11:42:13 -------------------------------------------------------------------------------- Pain Assessment Details Patient Name: Date of Service: [name]. 12/07/2020 10:30 A M Medical Record Number: [id number] Patient Account Number: [id number] Date of Birth/Sex: Treating RN: 07-13-1950 (69 y.o. Male) [name] Primary Care Makaiah Terwilliger: [name] Other Clinician: Referring Baily Serpe: Treating Nichole Neyer/Extender: Nash Dimmer, THO MA S Weeks in Treatment: 0 Active Problems Location of Pain Severity and Description of Pain Patient Has Paino Yes Site Locations Pain Location: Pain in Ulcers With Dressing Change: Yes Duration of the Pain. Constant / Intermittento Intermittent Rate the pain. Current Pain Level: 2 Character of Pain Describe the Pain: Burning, Throbbing Pain Management and Medication Current Pain Management: Medication: No Cold Application: No Rest: Yes Massage: No Activity: No T.E.N.S.: No Heat Application: No Leg drop or elevation: No Is the Current Pain Management Adequate: Inadequate How does your wound impact your activities of daily livingo Sleep: Yes Bathing: No Appetite:  No Relationship With Others: No Bladder Continence: No Emotions: No Bowel Continence: No Work: No Toileting: No Drive: No Dressing: No Hobbies: No Electronic Signature(s) Signed: 12/07/2020 5:54:05 PM By: [name] Entered By: [name] on 12/07/2020 11:36:34 -------------------------------------------------------------------------------- Patient/Caregiver Education Details Patient Name: Date of Service: [name] 6/2/2022andnbsp10:30 [place] Record Number: [id number] Patient Account Number: [id number] Date of Birth/Gender: Treating RN: Jun 20, 1951 (69 y.o. Male) Rhae Hammock Primary Care Physician: [name] Other Clinician: Referring Physician: Treating Physician/Extender: Nash Dimmer, THO MA S Weeks in Treatment: 0 Education Assessment Education Provided To: Patient Education Topics Provided Welcome T [facility] [place]: o Methods: Explain/Verbal Responses: State content correctly Electronic Signature(s) Signed: 12/07/2020 12:00:56 PM By: Rhae Hammock RN Entered By: Rhae Hammock on 12/07/2020 11:42:23 -------------------------------------------------------------------------------- Wound Assessment Details Patient Name: Date of Service: [name] 12/07/2020 10:30 A M Medical Record Number: [id number] Patient Account Number: [id number] Date of Birth/Sex: Treating RN: 08-14-1950 (69 y.o. Male) [name] Primary Care Quaron Delacruz: [name] Other Clinician: Referring Briony Parveen: Treating Seng Larch/Extender: Nash Dimmer, THO MA S Weeks in Treatment: 0 Wound Status Wound Number: 1 Primary Dehisced Wound Etiology: Wound Location: Right, Medial Lower Leg Wound Open Wounding Event: Surgical Injury Status: Date Acquired: 11/16/2020 Comorbid Coronary Artery Disease, Peripheral Arterial Disease, Weeks Of Treatment: 0 History: Peripheral Venous Disease Clustered Wound: No Photos Wound  Measurements Length: (cm) 5 Width: (cm) 1.4 Depth: (cm) 0.4 Area: (cm) 5.498 Volume: (cm) 2.199 % Reduction in  Area: 0% % Reduction in Volume: 0% Epithelialization: None Tunneling: No Undermining: No Wound Description Classification: Full Thickness Without Exposed Support Structures Wound Margin: Well defined, not attached Exudate Amount: Medium Exudate Type: Serosanguineous Exudate Color: red, brown Foul Odor After Cleansing: No Slough/Fibrino Yes Wound Bed Granulation Amount: Medium (34-66%) Exposed Structure Granulation Quality: Red Fascia Exposed: No Necrotic Amount: Medium (34-66%) Fat Layer (Subcutaneous Tissue) Exposed: Yes Necrotic Quality: Adherent Slough Tendon Exposed: No Muscle Exposed: No Joint Exposed: No Bone Exposed: No Electronic Signature(s) Signed: 12/07/2020 5:54:05 PM By: [name] Signed: 12/08/2020 5:48:41 PM By: Sandre Kitty Entered By: Sandre Kitty on 12/07/2020 16:48:49 -------------------------------------------------------------------------------- Wound Assessment Details Patient Name: Date of Service: [name]. 12/07/2020 10:30 A M Medical Record Number: [id number] Patient Account Number: [id number] Date of Birth/Sex: Treating RN: Nov 29, 1950 (70 y.o. Male) [name] Primary Care Erricka Falkner: [name] Other Clinician: Referring Reema Chick: Treating Tranquilino Fischler/Extender: Nash Dimmer, THO MA S Weeks in Treatment: 0 Wound Status Wound Number: 2 Primary Arterial Insufficiency Ulcer Etiology: Wound Location: Right T Great oe Wound Open Wounding Event: Gradually Appeared Status: Date Acquired: 09/18/2020 Comorbid Coronary Artery Disease, Peripheral Arterial Disease, Weeks Of Treatment: 0 History: Peripheral Venous Disease Clustered Wound: No Photos Wound Measurements Length: (cm) 1.3 Width: (cm) 1.7 Depth: (cm) 0.3 Area: (cm) 1.736 Volume: (cm) 0.521 % Reduction in Area: 0% % Reduction in  Volume: 0% Epithelialization: None Tunneling: No Undermining: No Wound Description Classification: Full Thickness Without Exposed Support Structures Wound Margin: Distinct, outline attached Exudate Amount: Medium Exudate Type: Serosanguineous Exudate Color: red, brown Foul Odor After Cleansing: No Slough/Fibrino No Wound Bed Granulation Amount: None Present (0%) Exposed Structure Necrotic Amount: Large (67-100%) Fascia Exposed: No Necrotic Quality: Eschar Fat Layer (Subcutaneous Tissue) Exposed: Yes Tendon Exposed: No Muscle Exposed: No Joint Exposed: No Bone Exposed: No Electronic Signature(s) Signed: 12/07/2020 5:54:05 PM By: [name] Signed: 12/08/2020 5:48:41 PM By: Sandre Kitty Entered By: Sandre Kitty on 12/07/2020 16:49:33 -------------------------------------------------------------------------------- [place] Details Patient Name: Date of Service: [name]. 12/07/2020 10:30 A M Medical Record Number: [id number] Patient Account Number: [id number] Date of Birth/Sex: Treating RN: [birth year] (70 y.o. Male) [name] Primary Care Abdurahman Rugg: [name] Other Clinician: Referring Kambry Takacs: Treating Marvine Encalade/Extender: Kalman Shan HA Barbette Hair, THO MA S Weeks in Treatment: 0 Vital Signs Time Taken: 11:13 Temperature (F): 98.4 Height (in): 72 Pulse (bpm): 80 Source: Stated Respiratory Rate (breaths/min): 16 Weight (lbs): 162 Blood Pressure (mmHg): 111/70 Source: Stated Reference Range: 80 - 120 mg / dl Body Mass Index (BMI): 22 Electronic Signature(s) Signed: 12/07/2020 5:54:05 PM By: [name] Entered By: [name] on 12/07/2020 11:16:33

## 2020-12-08 NOTE — Patient Instructions (Signed)
  Follow-Up: At [facility], you and your health needs are our priority.  As part of our continuing mission to provide you with exceptional heart care, we have created designated Provider Care Teams.  These Care Teams include your primary Cardiologist (physician) and Advanced Practice Providers (APPs -  Physician Assistants and Nurse Practitioners) who all work together to provide you with the care you need, when you need it.  We recommend signing up for the patient portal called "MyChart".  Sign up information is provided on this After Visit Summary.  MyChart is used to connect with patients for Virtual Visits (Telemedicine).  Patients are able to view lab/test results, encounter notes, upcoming appointments, etc.  Non-urgent messages can be sent to your provider as well.   To learn more about what you can do with MyChart, go to [email or web address].    Your next appointment:   12 month(s)  The format for your next appointment:   In Person  Provider:   Oswaldo Milian, MD

## 2020-12-11 ENCOUNTER — Encounter (HOSPITAL_BASED_OUTPATIENT_CLINIC_OR_DEPARTMENT_OTHER): Payer: PPO | Admitting: Internal Medicine

## 2020-12-11 ENCOUNTER — Other Ambulatory Visit: Payer: Self-pay

## 2020-12-11 DIAGNOSIS — L97519 Non-pressure chronic ulcer of other part of right foot with unspecified severity: Secondary | ICD-10-CM | POA: Diagnosis not present

## 2020-12-11 DIAGNOSIS — L97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 with unspecified severity: Secondary | ICD-10-CM

## 2020-12-11 DIAGNOSIS — T8130XD Disruption of wound, unspecified, subsequent encounter: Secondary | ICD-10-CM

## 2020-12-11 DIAGNOSIS — I739 Peripheral vascular disease, unspecified: Secondary | ICD-10-CM | POA: Diagnosis not present

## 2020-12-13 NOTE — Progress Notes (Signed)
Wyatt, Jackson ([id number]) Visit Report for 12/11/2020 Chief Complaint Document Details Patient Name: Date of Service: [name], [name] 12/11/2020 3:30 PM Medical Record Number: [id number] Patient Account Number: [id number] Date of Birth/Sex: Treating RN: Mar 29, 1951 (70 y.o. [name] Primary Care Provider: [name] Other Clinician: Referring Provider: Treating Provider/Extender: Bartholome Bill in Treatment: 0 Information Obtained from: Patient Chief Complaint Surgical wound dehiscence of right lower extremity and Great toe wound. Electronic Signature(s) Signed: 12/11/2020 4:52:59 PM By: Kalman Shan DO Entered By: Kalman Shan on 12/11/2020 16:19:45 -------------------------------------------------------------------------------- Debridement Details Patient Name: Date of Service: [name] 12/11/2020 3:30 PM Medical Record Number: [id number] Patient Account Number: [id number] Date of Birth/Sex: Treating RN: [birth year] (70 y.o. [name] Primary Care Provider: [name] Other Clinician: Referring Provider: Treating Provider/Extender: Bartholome Bill in Treatment: 0 Debridement Performed for Assessment: Wound #1 Right,Medial Lower Leg Performed By: Physician Kalman Shan, DO Debridement Type: Debridement Level of Consciousness (Pre-procedure): Awake and Alert Pre-procedure Verification/Time Out Yes - 16:10 Taken: Start Time: 16:10 T Area Debrided (L x W): otal 4 (cm) x 0.7 (cm) = 2.8 (cm) Tissue and other material debrided: Viable, Non-Viable, Slough, Subcutaneous, Slough Level: Skin/Subcutaneous Tissue Debridement Description: Excisional Instrument: Curette Bleeding: Minimum Hemostasis Achieved: Pressure End Time: 16:11 Procedural Pain: 0 Post Procedural Pain: 0 Response to Treatment: Procedure was tolerated well Level of Consciousness (Post- Awake and Alert procedure): Post  Debridement Measurements of Total Wound Length: (cm) 4 Width: (cm) 0.7 Depth: (cm) 0.3 Volume: (cm) 0.66 Character of Wound/Ulcer Post Debridement: Improved Post Procedure Diagnosis Same as Pre-procedure Electronic Signature(s) Signed: 12/11/2020 4:52:59 PM By: Kalman Shan DO Signed: 12/13/2020 5:57:05 PM By: Levan Hurst RN, BSN Entered By: Levan Hurst on 12/11/2020 16:11:35 -------------------------------------------------------------------------------- HPI Details Patient Name: Date of Service: [name]. 12/11/2020 3:30 PM Medical Record Number: [id number] Patient Account Number: [id number] Date of Birth/Sex: Treating RN: 02/03/1951 (70 y.o. [name] Primary Care Provider: [name] Other Clinician: Referring Provider: Treating Provider/Extender: Bartholome Bill in Treatment: 0 History of Present Illness HPI Description: Admission 6/2 Mr. Grove Defina is a 70 year old male with a past medical history of peripheral arterial disease status post right common femoral to peroneal bypass on 10/13/2020 and coil embolization of bypass fistula x2 on 11/01/2020. He presents today because he has wound dehiscence that occurred 3 weeks ago from his recent vascular surgery. He has been using wet-to-dry dressings to this area. He also has necrotic tissue to the right great toe. This was the original symptom that precipitated further vascular evaluation that led to his aortogram and ultimately bypass graft. He is currently keeping this area covered. He currently denies signs of infection. 6/6; patient presents for 1 week follow-up. He had Kerlix/Coban compression for the past week with Hydrofera Blue underneath. He reports tolerating this well without any issues. He thinks the wound looks smaller today. He has been using Betadine to the toe wound. He has no complaints today. He denies signs of infection. Electronic Signature(s) Signed: 12/11/2020 4:52:59  PM By: Kalman Shan DO Entered By: Kalman Shan on 12/11/2020 16:20:20 -------------------------------------------------------------------------------- Physical Exam Details Patient Name: Date of Service: Wyatt, Jackson 12/11/2020 3:30 PM Medical Record Number: [id number] Patient Account Number: [id number] Date of Birth/Sex: Treating RN: [birth year] (70 y.o. [name] Primary Care Provider: [name] Other Clinician: Referring Provider: Treating Provider/Extender: Vivia Budge Weeks in Treatment: 0 Constitutional respirations regular, non-labored and within  target range for patient.. Cardiovascular 2+ dorsalis pedis/posterior tibialis pulses. Psychiatric pleasant and cooperative. Notes Right lower extremity: 2+ pitting edema to the knee. Open wound to the medial aspect with nonviable tissue and scant granulation tissue present. No signs of infection. Right foot: The right great toe has an eschar present. No obvious signs of infection. Electronic Signature(s) Signed: 12/11/2020 4:52:59 PM By: Kalman Shan DO Entered By: Kalman Shan on 12/11/2020 16:21:10 -------------------------------------------------------------------------------- Physician Orders Details Patient Name: Date of Service: [name] 12/11/2020 3:30 PM Medical Record Number: [id number] Patient Account Number: [id number] Date of Birth/Sex: Treating RN: Dec 01, 1950 (70 y.o. 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 - with Dr. Heber [place] Return A Nurse Visit: - 1 week for rewrap Bathing/ Shower/ Hygiene May shower with protection but do not get wound dressing(s) wet. Edema Control - Lymphedema / SCD / Other Elevate legs to the level of the heart or above for 30 minutes daily and/or when sitting, a frequency of: - throughout the day Avoid standing for long periods of time. Exercise regularly Wound Treatment Wound #1 - Lower Leg Wound Laterality: Right, Medial Cleanser: Soap and Water 1 x Per Week/7 Days Discharge Instructions: May shower and wash wound with dial antibacterial soap and water prior to dressing change. Cleanser: Wound Cleanser (Generic) 1 x Per Week/7 Days Discharge Instructions: Cleanse the wound with wound cleanser prior to applying a clean dressing using gauze sponges, not tissue or cotton balls. Peri-Wound Care: Sween Lotion (Moisturizing lotion) 1 x Per Week/7 Days Discharge Instructions: Apply moisturizing lotion as directed Prim Dressing: Hydrofera Blue Classic Foam, 4x4 in 1 x Per Week/7 Days ary Discharge Instructions: Moisten with saline prior to applying to wound bed. Apply over Santyl. Prim Dressing: Santyl Ointment 1 x Per Week/7 Days ary Discharge Instructions: Apply nickel thick amount to wound bed as instructed Secondary Dressing: Woven Gauze Sponge, Non-Sterile 4x4 in (Generic) 1 x Per Week/7 Days Discharge Instructions: Apply over primary dressing as directed. Secondary Dressing: ABD Pad, 5x9 1 x Per Week/7 Days Discharge Instructions: Apply over primary dressing as directed. Compression Wrap: Kerlix Roll 4.5x3.1 (in/yd) (Generic) 1 x Per Week/7 Days Discharge Instructions: Apply Kerlix and Coban compression as directed. Compression Wrap: Coban Self-Adherent Wrap 4x5 (in/yd) (Generic) 1 x Per Week/7 Days Discharge Instructions: Apply over Kerlix as directed. Wound #2  - T Great oe Wound Laterality: Right Cleanser: Soap and Water 1 x Per Day/7 Days Discharge Instructions: May shower and wash wound with dial antibacterial soap and water prior to dressing change. Prim Dressing: betadine 1 x Per Day/7 Days ary Discharge Instructions: apply daily Wound #3 - Lower Leg Wound Laterality: Right, Medial, Distal Cleanser: Soap and Water 1 x Per Week/7 Days Discharge Instructions: May shower and wash wound with dial antibacterial soap and water prior to dressing change. Cleanser: Wound Cleanser (Generic) 1 x Per Week/7 Days Discharge Instructions: Cleanse the wound with wound cleanser prior to applying a clean dressing using gauze sponges, not tissue or cotton balls. Peri-Wound Care: Sween Lotion (Moisturizing lotion) 1 x Per Week/7 Days Discharge Instructions: Apply moisturizing  lotion as directed Prim Dressing: Hydrofera Blue Classic Foam, 4x4 in 1 x Per Week/7 Days ary Discharge Instructions: Moisten with saline prior to applying to wound bed Secondary Dressing: Woven Gauze Sponge, Non-Sterile 4x4 in (Generic) 1 x Per Week/7 Days Discharge Instructions: Apply over primary dressing as directed. Compression Wrap: Kerlix Roll 4.5x3.1 (in/yd) (Generic) 1 x Per Week/7 Days Discharge Instructions: Apply Kerlix and Coban compression as directed. Compression Wrap: Coban Self-Adherent Wrap 4x5 (in/yd) (Generic) 1 x Per Week/7 Days Discharge Instructions: Apply over Kerlix as directed. Electronic Signature(s) Signed: 12/11/2020 4:52:59 PM By: Kalman Shan DO Entered By: Kalman Shan on 12/11/2020 16:21:26 -------------------------------------------------------------------------------- Problem List Details Patient Name: Date of Service: [name] 12/11/2020 3:30 PM Medical Record Number: [id number] Patient Account Number: [id number] Date of Birth/Sex: Treating RN: 06-23-1951 (70 y.o. 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 12/07/2020 No Yes I73.9 Peripheral vascular disease, unspecified 12/07/2020 No Yes Inactive Problems ICD-10 Code Description Active Date Inactive Date T81.30XA Disruption of wound, unspecified, initial encounter 12/07/2020 12/07/2020 Resolved Problems Electronic Signature(s) Signed: 12/11/2020 4:52:59 PM By: Kalman Shan DO Entered By: Kalman Shan on 12/11/2020 16:33:00 -------------------------------------------------------------------------------- Progress Note Details Patient Name: Date of Service: [name] 12/11/2020 3:30 PM Medical Record Number: [id number] Patient Account Number: [id number] Date of Birth/Sex: Treating RN: 1951/04/10 (70 y.o. [name] Primary Care Provider: [name] Other Clinician: Referring Provider: Treating Provider/Extender: Bartholome Bill in Treatment: 0 Subjective Chief Complaint Information obtained from Patient Surgical wound dehiscence of right lower extremity and Great toe wound. History of Present Illness (HPI) Admission 6/2 Mr. Toma Arts is a 70 year old male with a past medical history of peripheral arterial disease status post right common femoral to peroneal bypass on 10/13/2020 and coil embolization of bypass fistula x2 on 11/01/2020. He presents today because he has wound dehiscence that occurred 3 weeks ago from his recent vascular surgery. He has been using wet-to-dry dressings to this area. He also has necrotic tissue to the right  great toe. This was the original symptom that precipitated further vascular evaluation that led to his aortogram and ultimately bypass graft. He is currently keeping this area covered. He currently denies signs of infection. 6/6; patient presents for 1 week follow-up. He had Kerlix/Coban compression for the past week with Hydrofera Blue underneath. He reports tolerating this well without any issues. He thinks the wound looks smaller today. He has been using Betadine to the toe wound. He has no complaints today. He denies signs of infection. Patient History Information obtained from Patient. Family History Unknown History. Social History Never smoker, Marital Status - Married, Alcohol Use - Moderate, Drug Use - No History, Caffeine Use - Daily. Medical History Cardiovascular Patient has history of Coronary Artery Disease, Peripheral Arterial Disease, Peripheral Venous Disease Objective Constitutional respirations regular, non-labored and within target range for patient.. Vitals Time Taken: 3:53 PM, Height: 72 in, Weight: 162 lbs, BMI: 22, Temperature: 97.7 F, Pulse: 82 bpm, Respiratory Rate: 16 breaths/min, Blood Pressure: 136/86 mmHg. Cardiovascular 2+ dorsalis pedis/posterior tibialis pulses. Psychiatric pleasant and cooperative. General Notes: Right lower extremity: 2+ pitting  edema to the knee. Open wound to the medial aspect with nonviable tissue and scant granulation tissue present. No signs of infection. Right foot: The right great toe has an eschar present. No obvious signs of infection. Integumentary (Hair, Skin) Wound #1 status is Open. Original cause of wound was Surgical Injury. The date acquired was: 11/16/2020. The wound is located on the Right,Medial Lower Leg. The wound measures 4cm length x 0.7cm width x 0.3cm depth; 2.199cm^2 area and 0.66cm^3 volume. There is Fat Layer (Subcutaneous Tissue) exposed. There is no tunneling or undermining noted. There is a medium amount  of serosanguineous drainage noted. The wound margin is well defined and not attached to the wound base. There is medium (34-66%) red granulation within the wound bed. There is a medium (34-66%) amount of necrotic tissue within the wound bed including Adherent Slough. Wound #2 status is Open. Original cause of wound was Gradually Appeared. The date acquired was: 09/18/2020. The wound is located on the Right T Great. oe The wound measures 1cm length x 1.5cm width x 0.1cm depth; 1.178cm^2 area and 0.118cm^3 volume. There is Fat Layer (Subcutaneous Tissue) exposed. There is no tunneling or undermining noted. There is a small amount of serosanguineous drainage noted. The wound margin is distinct with the outline attached to the wound base. There is no granulation within the wound bed. There is a large (67-100%) amount of necrotic tissue within the wound bed including Eschar. Wound #3 status is Open. Original cause of wound was Gradually Appeared. The date acquired was: 12/11/2020. The wound is located on the Right,Distal,Medial Lower Leg. The wound measures 0.7cm length x 0.3cm width x 0.2cm depth; 0.165cm^2 area and 0.033cm^3 volume. There is Fat Layer (Subcutaneous Tissue) exposed. There is no tunneling or undermining noted. There is a small amount of serosanguineous drainage noted. The wound margin is flat and intact. There is large (67-100%) pink granulation within the wound bed. There is a small (1-33%) amount of necrotic tissue within the wound bed including Adherent Slough. 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 Nonviable tissue was debrided today. He does have a small  opening at the most distal site of where the incision site began. He states this has always been there. We will cover this also with Hydrofera Blue. We will continue with Kerlix/Coban with Hydrofera Blue underneath to the open wounds. T the great toe will continue Betadine. No signs of infection today. He will follow-up in 1 o week for nurse visit and then with me in 2 weeks. Procedures Wound #1 Pre-procedure diagnosis of Wound #1 is a Dehisced Wound located on the Right,Medial Lower Leg . There was a Excisional Skin/Subcutaneous Tissue Debridement with a total area of 2.8 sq cm performed by Kalman Shan, DO. With the following instrument(s): Curette to remove Viable and Non-Viable tissue/material. Material removed includes Subcutaneous Tissue and Slough and. No specimens were taken. A time out was conducted at 16:10, prior to the start of the procedure. A Minimum amount of bleeding was controlled with Pressure. The procedure was tolerated well with a pain level of 0 throughout and a pain level of 0 following the procedure. Post Debridement Measurements: 4cm length x 0.7cm width x 0.3cm depth; 0.66cm^3 volume. Character of Wound/Ulcer Post  Debridement is improved. Post procedure Diagnosis Wound #1: Same as Pre-Procedure Plan Follow-up Appointments: Return Appointment in 2 weeks. - with Dr. Heber [place] Nurse Visit: - 1 week for rewrap Bathing/ Shower/ Hygiene: May shower with protection but do not get wound dressing(s) wet. Edema Control - Lymphedema / SCD / Other: Elevate legs to the level of the heart or above for 30 minutes daily and/or when sitting, a frequency of: - throughout the day Avoid standing for long periods of time. Exercise regularly WOUND #1: - Lower Leg Wound Laterality: Right, Medial Cleanser: Soap and Water 1 x Per Week/7 Days Discharge Instructions: May shower and wash wound with dial antibacterial soap and water prior to dressing change. Cleanser: Wound Cleanser  (Generic) 1 x Per Week/7 Days Discharge Instructions: Cleanse the wound with wound cleanser prior to applying a clean dressing using gauze sponges, not tissue or cotton balls. Peri-Wound Care: Sween Lotion (Moisturizing lotion) 1 x Per Week/7 Days Discharge Instructions: Apply moisturizing lotion as directed Prim Dressing: Hydrofera Blue Classic Foam, 4x4 in 1 x Per Week/7 Days ary Discharge Instructions: Moisten with saline prior to applying to wound bed. Apply over Santyl. Prim Dressing: Santyl Ointment 1 x Per Week/7 Days ary Discharge Instructions: Apply nickel thick amount to wound bed as instructed Secondary Dressing: Woven Gauze Sponge, Non-Sterile 4x4 in (Generic) 1 x Per Week/7 Days Discharge Instructions: Apply over primary dressing as directed. Secondary Dressing: ABD Pad, 5x9 1 x Per Week/7 Days Discharge Instructions: Apply over primary dressing as directed. Com pression Wrap: Kerlix Roll 4.5x3.1 (in/yd) (Generic) 1 x Per Week/7 Days Discharge Instructions: Apply Kerlix and Coban compression as directed. Com pression Wrap: Coban Self-Adherent Wrap 4x5 (in/yd) (Generic) 1 x Per Week/7 Days Discharge Instructions: Apply over Kerlix as directed. WOUND #2: - T Great Wound Laterality: Right oe Cleanser: Soap and Water 1 x Per Day/7 Days Discharge Instructions: May shower and wash wound with dial antibacterial soap and water prior to dressing change. Prim Dressing: betadine 1 x Per Day/7 Days ary Discharge Instructions: apply daily WOUND #3: - Lower Leg Wound Laterality: Right, Medial, Distal Cleanser: Soap and Water 1 x Per Week/7 Days Discharge Instructions: May shower and wash wound with dial antibacterial soap and water prior to dressing change. Cleanser: Wound Cleanser (Generic) 1 x Per Week/7 Days Discharge Instructions: Cleanse the wound with wound cleanser prior to applying a clean dressing using gauze sponges, not tissue or cotton balls. Peri-Wound Care: Sween Lotion  (Moisturizing lotion) 1 x Per Week/7 Days Discharge Instructions: Apply moisturizing lotion as directed Prim Dressing: Hydrofera Blue Classic Foam, 4x4 in 1 x Per Week/7 Days ary Discharge Instructions: Moisten with saline prior to applying to wound bed Secondary Dressing: Woven Gauze Sponge, Non-Sterile 4x4 in (Generic) 1 x Per Week/7 Days Discharge Instructions: Apply over primary dressing as directed. Com pression Wrap: Kerlix Roll 4.5x3.1 (in/yd) (Generic) 1 x Per Week/7 Days Discharge Instructions: Apply Kerlix and Coban compression as directed. Com pression Wrap: Coban Self-Adherent Wrap 4x5 (in/yd) (Generic) 1 x Per Week/7 Days Discharge Instructions: Apply over Kerlix as directed. 1. In office sharp debridement 2. Hydrofera Blue under Kerlix/Coban 3. Nurse visit next week and then with me in 2 weeks Electronic Signature(s) Signed: 12/11/2020 4:52:59 PM By: Kalman Shan DO Entered By: Kalman Shan on 12/11/2020 16:23:25 -------------------------------------------------------------------------------- HxROS Details Patient Name: Date of Service: [name]. 12/11/2020 3:30 PM Medical Record Number: [id number] Patient Account Number: [id number] Date of Birth/Sex: Treating RN: January 15, 1951 (70 y.o. [name] Primary  Care Provider: [name] Other Clinician: Referring Provider: Treating Provider/Extender: Bartholome Bill in Treatment: 0 Information Obtained From Patient Cardiovascular Medical History: Positive for: Coronary Artery Disease; Peripheral Arterial Disease; Peripheral Venous Disease Immunizations Pneumococcal Vaccine: Received Pneumococcal Vaccination: Yes Implantable Devices None Family and Social History Unknown History: Yes; Never smoker; Marital Status - Married; Alcohol Use: Moderate; Drug Use: No History; Caffeine Use: Daily; Financial Concerns: No; Food, Clothing or Shelter Needs: No; Support System Lacking: No;  Transportation Concerns: No Electronic Signature(s) Signed: 12/11/2020 4:52:59 PM By: Kalman Shan DO Signed: 12/13/2020 5:57:05 PM By: Levan Hurst RN, BSN Entered By: Kalman Shan on 12/11/2020 16:20:26 -------------------------------------------------------------------------------- [place] Details Patient Name: Date of Service: [name] 12/11/2020 Medical Record Number: [id number] Patient Account Number: [id number] Date of Birth/Sex: Treating RN: 1950/11/02 (70 y.o. 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 I73.9 Peripheral vascular disease, unspecified Facility Procedures CPT4 Code: [id number] Description: 19509 - DEB SUBQ TISSUE 20 SQ CM/< ICD-10 Diagnosis Description T81.30XD Disruption of wound, unspecified, subsequent encounter 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 with unspecified se L97.519 Non-pressure  chronic ulcer of other part of right foot with unspecified severit I73.9 Peripheral vascular disease, unspecified Modifier: verity y Quantity: 1 Physician Procedures : CPT4 Code Description Modifier [id number] - WC PHYS SUBQ TISS 20 SQ CM ICD-10 Diagnosis Description T81.30XD Disruption of wound, unspecified, subsequent encounter 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

## 2020-12-13 NOTE — Progress Notes (Signed)
TREVYON, SWOR ([id number]) Visit Report for 12/11/2020 Arrival Information Details Patient Name: Date of Service: TAEGAN, STANDAGE 12/11/2020 3:30 PM Medical Record Number: [id number] Patient Account Number: [id number] Date of Birth/Sex: Treating RN: 1950/09/24 (70 y.o. [name] Primary Care Tamyka Bezio: [name] Other Clinician: Referring Valeta Paz: Treating Chana Lindstrom/Extender: Bartholome Bill in Treatment: 0 Visit Information History Since Last Visit Added or deleted any medications: No Patient Arrived: Ambulatory Any new allergies or adverse reactions: No Arrival Time: 15:51 Had a fall or experienced change in No Accompanied By: self activities of daily living that may affect Transfer Assistance: None risk of falls: Patient Identification Verified: Yes Signs or symptoms of abuse/neglect since last visito No Secondary Verification Process Completed: Yes Hospitalized since last visit: No Patient Requires Transmission-Based Precautions: No Implantable device outside of the clinic excluding No Patient Has Alerts: Yes cellular tissue based products placed in the center Patient Alerts: Patient on Blood Thinner since last visit: ABI R=0.95 Has Dressing in Place as Prescribed: Yes ABI L=1.21 Pain Present Now: No Electronic Signature(s) Signed: 12/12/2020 2:13:29 PM By: Sandre Kitty Entered By: Sandre Kitty on 12/11/2020 15:53:29 -------------------------------------------------------------------------------- Encounter Discharge Information Details Patient Name: Date of Service: [name] 12/11/2020 3:30 PM Medical Record Number: [id number] Patient Account Number: [id number] Date of Birth/Sex: Treating RN: 1950/07/19 (70 y.o. [name] Primary Care Namiyah Grantham: [name] Other Clinician: Referring Amarria Andreasen: Treating Forrest Demuro/Extender: Bartholome Bill in Treatment: 0 Encounter Discharge Information Items  Post Procedure Vitals Discharge Condition: Stable Temperature (F): 97.7 Ambulatory Status: Ambulatory Pulse (bpm): 82 Discharge Destination: Home Respiratory Rate (breaths/min): 16 Transportation: Private Auto Blood Pressure (mmHg): 136/86 Accompanied By: self Schedule Follow-up Appointment: Yes Clinical Summary of Care: Electronic Signature(s) Signed: 12/11/2020 5:44:33 PM By: Deon Pilling Entered By: Deon Pilling on 12/11/2020 17:34:45 -------------------------------------------------------------------------------- Lower Extremity Assessment Details Patient Name: Date of Service: MAXAMILLIAN, TIENDA 12/11/2020 3:30 PM Medical Record Number: [id number] Patient Account Number: [id number] Date of Birth/Sex: Treating RN: 1950-12-10 (70 y.o. [name] Primary Care Skyelar Halliday: [name] Other Clinician: Referring Kateena Degroote: Treating Gearold Wainer/Extender: Vivia Budge Weeks in Treatment: 0 Edema Assessment Assessed: Shirlyn Goltz: No] Patrice Paradise: No] Edema: [Left: Ye] [Right: s] Calf Left: Right: Point of Measurement: From Medial Instep 38 cm Ankle Left: Right: Point of Measurement: From Medial Instep 26 cm Vascular Assessment Pulses: Dorsalis Pedis Palpable: [Right:Yes] Electronic Signature(s) Signed: 12/13/2020 5:57:05 PM By: Levan Hurst RN, BSN Entered By: Levan Hurst on 12/11/2020 16:08:41 -------------------------------------------------------------------------------- Multi Wound Chart Details Patient Name: Date of Service: [name] 12/11/2020 3:30 PM Medical Record Number: [id number] Patient Account Number: [id number] Date of Birth/Sex: Treating RN: 30-Aug-1950 (70 y.o. [name] Primary Care Zulema Pulaski: [name] Other Clinician: Referring Chaska Hagger: Treating Avantika Shere/Extender: Bartholome Bill in Treatment: 0 Vital Signs Height(in): 72 Pulse(bpm): 40 Weight(lbs): 162 Blood Pressure(mmHg): 136/86 Body  Mass Index(BMI): 22 Temperature(F): 97.7 Respiratory Rate(breaths/min): 16 Photos: [1:No Photos Right, Medial Lower Leg] [2:No Photos Right T Great oe] [3:No Photos Right, Distal, Medial Lower Leg] Wound Location: [1:Surgical Injury] [2:Gradually Appeared] [3:Gradually Appeared] Wounding Event: [1:Dehisced Wound] [2:Arterial Insufficiency Ulcer] [3:Venous Leg Ulcer] Primary Etiology: [1:Coronary Artery Disease, Peripheral] [2:Coronary Artery Disease, Peripheral] [3:Coronary Artery Disease, Peripheral] Comorbid History: [1:Arterial Disease, Peripheral Venous Disease 11/16/2020] [2:Arterial Disease, Peripheral Venous Disease 09/18/2020] [3:Arterial Disease, Peripheral Venous Disease 12/11/2020] Date Acquired: [1:0] [2:0] [3:0] Weeks of Treatment: [1:Open] [2:Open] [3:Open] Wound Status: [1:4x0.7x0.3] [2:1x1.5x0.1] [3:0.7x0.3x0.2] Measurements L x W x D (cm) [1:2.199] [  2:1.178] [3:0.165] A (cm) : rea [1:0.66] [2:0.118] [3:0.033] Volume (cm) : [1:60.00%] [2:32.10%] [3:15.80%] % Reduction in Area: [1:70.00%] [2:77.40%] [3:15.40%] % Reduction in Volume: [1:Full Thickness Without Exposed] [2:Full Thickness Without Exposed] [3:Full Thickness Without Exposed] Classification: [1:Support Structures Medium] [2:Support Structures Small] [3:Support Structures Small] Exudate A mount: [1:Serosanguineous] [2:Serosanguineous] [3:Serosanguineous] Exudate Type: [1:red, brown] [2:red, brown] [3:red, brown] Exudate Color: [1:Well defined, not attached] [2:Distinct, outline attached] [3:Flat and Intact] Wound Margin: [1:Medium (34-66%)] [2:None Present (0%)] [3:Large (67-100%)] Granulation A mount: [1:Red] [2:[name]] [3:Pink] Granulation Quality: [1:Medium (34-66%)] [2:Large (67-100%)] [3:Small (1-33%)] Necrotic A mount: [1:Adherent Slough] [2:Eschar] [3:Adherent Slough] Necrotic Tissue: [1:Fat Layer (Subcutaneous Tissue): Yes Fat Layer (Subcutaneous Tissue): Yes Fat Layer (Subcutaneous Tissue): Yes] Exposed  Structures: [1:Fascia: No Tendon: No Muscle: No Joint: No Bone: No None] [2:Fascia: No Tendon: No Muscle: No Joint: No Bone: No None] [3:Fascia: No Tendon: No Muscle: No Joint: No Bone: No None] Epithelialization: [1:Debridement - Excisional] [2:[name]] [3:[name]] Debridement: Pre-procedure Verification/Time Out 16:10 [2:[name]] [3:[name]] Taken: [1:Subcutaneous, Slough] [2:[name]] [3:[name]] Tissue Debrided: [1:Skin/Subcutaneous Tissue] [2:[name]] [3:[name]] Level: [1:2.8] [2:[name]] [3:[name]] Debridement A (sq cm): [1:rea Curette] [2:[name]] [3:[name]] Instrument: [1:Minimum] [2:[name]] [3:[name]] Bleeding: [1:Pressure] [2:[name]] [3:[name]] Hemostasis A chieved: [1:0] [2:[name]] [3:[name]] Procedural Pain: [1:0] [2:[name]] [3:[name]] Post Procedural Pain: [1:Procedure was tolerated well] [2:[name]] [3:[name]] Debridement Treatment Response: [1:4x0.7x0.3] [2:[name]] [3:[name]] Post Debridement Measurements L x W x D (cm) [1:0.66] [2:[name]] [3:[name]] Post Debridement Volume: (cm) [1:Debridement] [2:[name]] [3:[name]] Treatment Notes Electronic Signature(s) Signed: 12/11/2020 4:52:59 PM By: Kalman Shan DO Signed: 12/13/2020 5:57:05 PM By: Levan Hurst RN, BSN Entered By: Kalman Shan on 12/11/2020 16:19:37 -------------------------------------------------------------------------------- Multi-Disciplinary Care Plan Details Patient Name: Date of Service: [name] 12/11/2020 3:30 PM Medical Record Number: [id number] Patient Account Number: [id number] Date of Birth/Sex: Treating RN: Nov 09, 1950 (70 y.o. [name] Primary Care Sarena Jezek: [name] Other Clinician: Referring Syvanna Ciolino: Treating Blythe Hartshorn/Extender: Vivia Budge Weeks in Treatment: 0 Active Inactive Wound/Skin Impairment Nursing Diagnoses: Impaired tissue integrity Knowledge deficit related to ulceration/compromised skin integrity Goals: Patient will have a decrease in wound volume by X% from date: (specify in notes) Date Initiated: 12/07/2020 Target  Resolution Date: 01/13/2021 Goal Status: Active Patient/caregiver will verbalize understanding of skin care regimen Date Initiated: 12/07/2020 Target Resolution Date: 01/11/2021 Goal Status: Active Ulcer/skin breakdown will have a volume reduction of 30% by week 4 Date Initiated: 12/07/2020 Target Resolution Date: 01/13/2021 Goal Status: Active Interventions: Assess patient/caregiver ability to obtain necessary supplies Assess patient/caregiver ability to perform ulcer/skin care regimen upon admission and as needed Assess ulceration(s) every visit Notes: Electronic Signature(s) Signed: 12/13/2020 5:57:05 PM By: Levan Hurst RN, BSN Entered By: Levan Hurst on 12/11/2020 17:31:24 -------------------------------------------------------------------------------- Pain Assessment Details Patient Name: Date of Service: [name] 12/11/2020 3:30 PM Medical Record Number: [id number] Patient Account Number: [id number] Date of Birth/Sex: Treating RN: July 01, 1951 (70 y.o. [name] Primary Care Kristyn Obyrne: [name] Other Clinician: Referring Sadeel Fiddler: Treating Cristian Grieves/Extender: Vivia Budge Weeks in Treatment: 0 Active Problems Location of Pain Severity and Description of Pain Patient Has Paino No Site Locations Pain Management and Medication Current Pain Management: Electronic Signature(s) Signed: 12/12/2020 2:13:29 PM By: Sandre Kitty Signed: 12/13/2020 5:57:05 PM By: Levan Hurst RN, BSN Entered By: Sandre Kitty on 12/11/2020 15:53:51 -------------------------------------------------------------------------------- Patient/Caregiver Education Details Patient Name: Date of Service: [name] 6/6/2022andnbsp3:30 PM Medical Record Number: [id number] Patient Account Number: [id number] Date of Birth/Gender: Treating RN: [birth year] (70 y.o. [name] Primary Care Physician: [name] Other Clinician:  Referring  Physician: Treating Physician/Extender: Bartholome Bill in Treatment: 0 Education Assessment Education Provided To: Patient Education Topics Provided Wound/Skin Impairment: Methods: Explain/Verbal Responses: State content correctly Electronic Signature(s) Signed: 12/13/2020 5:57:05 PM By: Levan Hurst RN, BSN Entered By: Levan Hurst on 12/11/2020 17:31:37 -------------------------------------------------------------------------------- Wound Assessment Details Patient Name: Date of Service: [name] 12/11/2020 3:30 PM Medical Record Number: [id number] Patient Account Number: [id number] Date of Birth/Sex: Treating RN: August 14, 1950 (70 y.o. [name] Primary Care Georganne Siple: [name] Other Clinician: Referring Heywood Tokunaga: Treating Psalms Olarte/Extender: Vivia Budge Weeks in Treatment: 0 Wound Status Wound Number: 1 Primary Dehisced Wound Etiology: Wound Location: Right, Medial Lower Leg Wound Open Wounding Event: Surgical Injury Status: Date Acquired: 11/16/2020 Comorbid Coronary Artery Disease, Peripheral Arterial Disease, Weeks Of Treatment: 0 History: Peripheral Venous Disease Clustered Wound: No Photos Wound Measurements Length: (cm) 4 Width: (cm) 0.7 Depth: (cm) 0.3 Area: (cm) 2.199 Volume: (cm) 0.66 % Reduction in Area: 60% % Reduction in Volume: 70% Epithelialization: None Tunneling: No Undermining: No Wound Description Classification: Full Thickness Without Exposed Support Structures Wound Margin: Well defined, not attached Exudate Amount: Medium Exudate Type: Serosanguineous Exudate Color: red, brown Foul Odor After Cleansing: No Slough/Fibrino Yes Wound Bed Granulation Amount: Medium (34-66%) Exposed Structure Granulation Quality: Red Fascia Exposed: No Necrotic Amount: Medium (34-66%) Fat Layer (Subcutaneous Tissue) Exposed: Yes Necrotic Quality: Adherent Slough Tendon Exposed: No Muscle  Exposed: No Joint Exposed: No Bone Exposed: No Treatment Notes Wound #1 (Lower Leg) Wound Laterality: Right, Medial Cleanser Wound Cleanser Discharge Instruction: Cleanse the wound with wound cleanser prior to applying a clean dressing using gauze sponges, not tissue or cotton balls. Soap and Water Discharge Instruction: May shower and wash wound with dial antibacterial soap and water prior to dressing change. Peri-Wound Care Sween Lotion (Moisturizing lotion) Discharge Instruction: Apply moisturizing lotion as directed Topical Primary Dressing Santyl Ointment Discharge Instruction: Apply nickel thick amount to wound bed as instructed Hydrofera Blue Classic Foam, 4x4 in Discharge Instruction: Moisten with saline prior to applying to wound bed. Apply over Santyl. Secondary Dressing Woven Gauze Sponge, Non-Sterile 4x4 in Discharge Instruction: Apply over primary dressing as directed. ABD Pad, 5x9 Discharge Instruction: Apply over primary dressing as directed. Secured With Compression Wrap Kerlix Roll 4.5x3.1 (in/yd) Discharge Instruction: Apply Kerlix and Coban compression as directed. Coban Self-Adherent Wrap 4x5 (in/yd) Discharge Instruction: Apply over Kerlix as directed. Compression Stockings Add-Ons Electronic Signature(s) Signed: 12/12/2020 2:13:29 PM By: Sandre Kitty Signed: 12/13/2020 5:57:05 PM By: Levan Hurst RN, BSN Entered By: Sandre Kitty on 12/12/2020 14:09:41 -------------------------------------------------------------------------------- Wound Assessment Details Patient Name: Date of Service: JABORI, HENEGAR 12/11/2020 3:30 PM Medical Record Number: [id number] Patient Account Number: [id number] Date of Birth/Sex: Treating RN: 1950/11/16 (70 y.o. [name] Primary Care Darnice Comrie: [name] Other Clinician: Referring Briar Sword: Treating Carlo Lorson/Extender: Vivia Budge Weeks in Treatment: 0 Wound Status Wound Number: 2  Primary Arterial Insufficiency Ulcer Etiology: Wound Location: Right T Great oe Wound Open Wounding Event: Gradually Appeared Status: Date Acquired: 09/18/2020 Comorbid Coronary Artery Disease, Peripheral Arterial Disease, Weeks Of Treatment: 0 History: Peripheral Venous Disease Clustered Wound: No Photos Wound Measurements Length: (cm) 1 Width: (cm) 1.5 Depth: (cm) 0.1 Area: (cm) 1.178 Volume: (cm) 0.118 % Reduction in Area: 32.1% % Reduction in Volume: 77.4% Epithelialization: None Tunneling: No Undermining: No Wound Description Classification: Full Thickness Without Exposed Support Structures Wound Margin: Distinct, outline attached Exudate Amount: Small Exudate Type: Serosanguineous Exudate Color: red, brown Foul Odor After  Cleansing: No Slough/Fibrino No Wound Bed Granulation Amount: None Present (0%) Exposed Structure Necrotic Amount: Large (67-100%) Fascia Exposed: No Necrotic Quality: Eschar Fat Layer (Subcutaneous Tissue) Exposed: Yes Tendon Exposed: No Muscle Exposed: No Joint Exposed: No Bone Exposed: No Treatment Notes Wound #2 (Toe Great) Wound Laterality: Right Cleanser Soap and Water Discharge Instruction: May shower and wash wound with dial antibacterial soap and water prior to dressing change. Peri-Wound Care Topical Primary Dressing betadine Discharge Instruction: apply daily Secondary Dressing Secured With Compression Wrap Compression Stockings Add-Ons Electronic Signature(s) Signed: 12/12/2020 2:13:29 PM By: Sandre Kitty Signed: 12/13/2020 5:57:05 PM By: Levan Hurst RN, BSN Entered By: Sandre Kitty on 12/12/2020 14:10:02 -------------------------------------------------------------------------------- Wound Assessment Details Patient Name: Date of Service: [name] 12/11/2020 3:30 PM Medical Record Number: [id number] Patient Account Number: [id number] Date of Birth/Sex: Treating RN: 1950/07/27 (70 y.o. [name] Primary Care Arletha Marschke: [name] Other Clinician: Referring Aubrei Bouchie: Treating Miamor Ayler/Extender: Vivia Budge Weeks in Treatment: 0 Wound Status Wound Number: 3 Primary Venous Leg Ulcer Etiology: Wound Location: Right, Distal, Medial Lower Leg Wound Open Wounding Event: Gradually Appeared Status: Date Acquired: 12/11/2020 Comorbid Coronary Artery Disease, Peripheral Arterial Disease, Weeks Of Treatment: 0 History: Peripheral Venous Disease Clustered Wound: No Wound Measurements Length: (cm) 0.7 Width: (cm) 0.3 Depth: (cm) 0.2 Area: (cm) 0.165 Volume: (cm) 0.033 % Reduction in Area: 15.8% % Reduction in Volume: 15.4% Epithelialization: None Tunneling: No Undermining: No Wound Description Classification: Full Thickness Without Exposed Support Structures Wound Margin: Flat and Intact Exudate Amount: Small Exudate Type: Serosanguineous Exudate Color: red, brown Foul Odor After Cleansing: No Slough/Fibrino Yes Wound Bed Granulation Amount: Large (67-100%) Exposed Structure Granulation Quality: Pink Fascia Exposed: No Necrotic Amount: Small (1-33%) Fat Layer (Subcutaneous Tissue) Exposed: Yes Necrotic Quality: Adherent Slough Tendon Exposed: No Muscle Exposed: No Joint Exposed: No Bone Exposed: No Treatment Notes Wound #3 (Lower Leg) Wound Laterality: Right, Medial, Distal Cleanser Wound Cleanser Discharge Instruction: Cleanse the wound with wound cleanser prior to applying a clean dressing using gauze sponges, not tissue or cotton balls. Soap and Water Discharge Instruction: May shower and wash wound with dial antibacterial soap and water prior to dressing change. Peri-Wound Care Sween Lotion (Moisturizing lotion) Discharge Instruction: Apply moisturizing lotion as directed Topical Primary Dressing Hydrofera Blue Classic Foam, 4x4 in Discharge Instruction: Moisten with saline prior to applying to wound bed Secondary  Dressing Woven Gauze Sponge, Non-Sterile 4x4 in Discharge Instruction: Apply over primary dressing as directed. Secured With Compression Wrap Kerlix Roll 4.5x3.1 (in/yd) Discharge Instruction: Apply Kerlix and Coban compression as directed. Coban Self-Adherent Wrap 4x5 (in/yd) Discharge Instruction: Apply over Kerlix as directed. Compression Stockings Add-Ons Electronic Signature(s) Signed: 12/13/2020 5:57:05 PM By: Levan Hurst RN, BSN Entered By: Levan Hurst on 12/11/2020 16:13:12 -------------------------------------------------------------------------------- [place] Details Patient Name: Date of Service: [name] 12/11/2020 3:30 PM Medical Record Number: [id number] Patient Account Number: [id number] Date of Birth/Sex: Treating RN: 11-14-1950 (70 y.o. [name] Primary Care Shaunie Boehm: [name] Other Clinician: Referring Davis Vannatter: Treating Felice Hope/Extender: Bartholome Bill in Treatment: 0 Vital Signs Time Taken: 15:53 Temperature (F): 97.7 Height (in): 72 Pulse (bpm): 82 Weight (lbs): 162 Respiratory Rate (breaths/min): 16 Body Mass Index (BMI): 22 Blood Pressure (mmHg): 136/86 Reference Range: 80 - 120 mg / dl Electronic Signature(s) Signed: 12/12/2020 2:13:29 PM By: Sandre Kitty Entered By: Sandre Kitty on 12/11/2020 15:53:47

## 2020-12-18 ENCOUNTER — Other Ambulatory Visit: Payer: Self-pay

## 2020-12-18 ENCOUNTER — Encounter (HOSPITAL_BASED_OUTPATIENT_CLINIC_OR_DEPARTMENT_OTHER): Payer: PPO | Admitting: Internal Medicine

## 2020-12-18 DIAGNOSIS — L97519 Non-pressure chronic ulcer of other part of right foot with unspecified severity: Secondary | ICD-10-CM | POA: Diagnosis not present

## 2020-12-20 ENCOUNTER — Telehealth: Payer: PPO | Admitting: Internal Medicine

## 2020-12-20 NOTE — Progress Notes (Signed)
[name], [name] ([id number]) Visit Report for 12/18/2020 SuperBill Details Patient Name: Date of Service: [name], [name] 12/18/2020 Medical Record Number: [id number] Patient Account Number: [id number] Date of Birth/Sex: Treating RN: 1950-10-18 (70 y.o. 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

## 2020-12-20 NOTE — Progress Notes (Signed)
DILYN, SMILES ([id number]) Visit Report for 12/18/2020 Arrival Information Details Patient Name: Date of Service: [name], [name] 12/18/2020 10:00 A M Medical Record Number: [id number] Patient Account Number: [id number] Date of Birth/Sex: Treating RN: 12/19/1950 (70 y.o. [name], Lauren Primary Care Sujey Gundry: [name] Other Clinician: Referring Cabrina Shiroma: Treating Addie Alonge/Extender: Linton Flemings in Treatment: 1 Visit Information History Since Last Visit Added or deleted any medications: No Patient Arrived: Ambulatory Any new allergies or adverse reactions: No Arrival Time: 10:12 Had a fall or experienced change in No Accompanied By: self activities of daily living that may affect Transfer Assistance: None risk of falls: Patient Identification Verified: Yes Signs or symptoms of abuse/neglect since last visito No Secondary Verification Process Completed: Yes Hospitalized since last visit: No Patient Requires Transmission-Based Precautions: No Implantable device outside of the clinic excluding No Patient Has Alerts: Yes cellular tissue based products placed in the center Patient Alerts: Patient on Blood Thinner since last visit: ABI R=0.95 Has Dressing in Place as Prescribed: Yes ABI L=1.21 Has Compression in Place as Prescribed: Yes Pain Present Now: No Electronic Signature(s) Signed: 12/20/2020 6:22:58 PM By: Rhae Hammock RN Entered By: Rhae Hammock on 12/18/2020 10:12:30 -------------------------------------------------------------------------------- Clinic Level of Care Assessment Details Patient Name: Date of Service: [name], [name] 12/18/2020 10:00 A M Medical Record Number: [id number] Patient Account Number: [id number] Date of Birth/Sex: Treating RN: 08-23-1950 (70 y.o. [name], Lauren Primary Care Page Pucciarelli: [name] Other Clinician: Referring Mykayla Brinton: Treating Kirandeep Fariss/Extender: Linton Flemings in Treatment: 1 Clinic Level of Care Assessment Items TOOL 4 Quantity Score X- 1 0 Use when only an EandM is performed on FOLLOW-UP visit ASSESSMENTS - Nursing Assessment / Reassessment X- 1 10 Reassessment of Co-morbidities (includes updates in patient status) X- 1 5 Reassessment of Adherence to Treatment Plan ASSESSMENTS - Wound and Skin A ssessment / Reassessment []  - 0 Simple Wound Assessment / Reassessment - one wound X- 3 5 Complex Wound Assessment / Reassessment - multiple wounds X- 1 10 Dermatologic / Skin Assessment (not related to wound area) ASSESSMENTS - Focused Assessment []  - 0 Circumferential Edema Measurements - multi extremities []  - 0 Nutritional Assessment / Counseling / Intervention []  - 0 Lower Extremity Assessment (monofilament, tuning fork, pulses) []  - 0 Peripheral Arterial Disease Assessment (using hand held doppler) ASSESSMENTS - Ostomy and/or Continence Assessment and Care []  - 0 Incontinence Assessment and Management []  - 0 Ostomy Care Assessment and Management (repouching, etc.) PROCESS - Coordination of Care X - Simple Patient / Family Education for ongoing care 1 15 []  - 0 Complex (extensive) Patient / Family Education for ongoing care X- 1 10 Staff obtains Programmer, systems, Records, T Results / Process Orders est []  - 0 Staff telephones HHA, Nursing Homes / Clarify orders / etc []  - 0 Routine Transfer to another Facility (non-emergent condition) []  - 0 Routine Hospital Admission (non-emergent condition) []  - 0 New Admissions / Biomedical engineer / Ordering NPWT Apligraf, etc. , []  - 0 Emergency Hospital Admission (emergent condition) X- 1 10 Simple Discharge Coordination []  - 0 Complex (extensive) Discharge Coordination PROCESS - Special Needs []  - 0 Pediatric / Minor Patient Management []  - 0 Isolation Patient Management []  - 0 Hearing / Language / Visual special needs []  - 0 Assessment of Community assistance  (transportation, D/C planning, etc.) []  - 0 Additional assistance / Altered mentation []  - 0 Support Surface(s) Assessment (bed, cushion, seat, etc.) INTERVENTIONS - Wound Cleansing / Measurement []  -  0 Simple Wound Cleansing - one wound X- 3 5 Complex Wound Cleansing - multiple wounds []  - 0 Wound Imaging (photographs - any number of wounds) []  - 0 Wound Tracing (instead of photographs) []  - 0 Simple Wound Measurement - one wound X- 3 5 Complex Wound Measurement - multiple wounds INTERVENTIONS - Wound Dressings []  - 0 Small Wound Dressing one or multiple wounds X- 3 15 Medium Wound Dressing one or multiple wounds []  - 0 Large Wound Dressing one or multiple wounds []  - 0 Application of Medications - topical []  - 0 Application of Medications - injection INTERVENTIONS - Miscellaneous []  - 0 External ear exam []  - 0 Specimen Collection (cultures, biopsies, blood, body fluids, etc.) []  - 0 Specimen(s) / Culture(s) sent or taken to Lab for analysis []  - 0 Patient Transfer (multiple staff / Civil Service fast streamer / Similar devices) []  - 0 Simple Staple / Suture removal (25 or less) []  - 0 Complex Staple / Suture removal (26 or more) []  - 0 Hypo / Hyperglycemic Management (close monitor of Blood Glucose) []  - 0 Ankle / Brachial Index (ABI) - do not check if billed separately X- 1 5 Vital Signs Has the patient been seen at the hospital within the last three years: Yes Total Score: 155 Level Of Care: New/Established - Level 4 Electronic Signature(s) Signed: 12/20/2020 6:22:58 PM By: Rhae Hammock RN Entered By: Rhae Hammock on 12/18/2020 11:02:18 -------------------------------------------------------------------------------- Encounter Discharge Information Details Patient Name: Date of Service: [name]. 12/18/2020 10:00 A M Medical Record Number: [id number] Patient Account Number: [id number] Date of Birth/Sex: Treating RN: March 15, 1951 (70 y.o. [name],  Lauren Primary Care Mykaylah Ballman: [name] Other Clinician: Referring Edman Lipsey: Treating Keyundra Fant/Extender: Linton Flemings in Treatment: 1 Encounter Discharge Information Items Discharge Condition: Stable Ambulatory Status: Ambulatory Discharge Destination: Home Transportation: Private Auto Accompanied By: self Schedule Follow-up Appointment: Yes Clinical Summary of Care: Patient Declined Electronic Signature(s) Signed: 12/20/2020 6:22:58 PM By: Rhae Hammock RN Entered By: Rhae Hammock on 12/18/2020 11:01:19 -------------------------------------------------------------------------------- Patient/Caregiver Education Details Patient Name: Date of Service: [name] 6/13/2022andnbsp10:00 [place] Record Number: [id number] Patient Account Number: [id number] Date of Birth/Gender: Treating RN: 10-26-1950 (70 y.o. Erie Noe Primary Care Physician: [name] Other Clinician: Referring Physician: Treating Physician/Extender: Linton Flemings in Treatment: 1 Education Assessment Education Provided To: Patient Education Topics Provided Wound/Skin Impairment: Methods: Explain/Verbal Responses: State content correctly Electronic Signature(s) Signed: 12/20/2020 6:22:58 PM By: Rhae Hammock RN Entered By: Rhae Hammock on 12/18/2020 11:00:49 -------------------------------------------------------------------------------- Wound Assessment Details Patient Name: Date of Service: [name]. 12/18/2020 10:00 A M Medical Record Number: [id number] Patient Account Number: [id number] Date of Birth/Sex: Treating RN: [birth year] (70 y.o. [name], Lauren Primary Care Raymont Andreoni: [name] Other Clinician: Referring Graeson Nouri: Treating Yaseen Gilberg/Extender: Linton Flemings in Treatment: 1 Wound Status Wound Number: 1 Primary Etiology: Dehisced Wound Wound Location: Right, Medial  Lower Leg Wound Status: Open Wounding Event: Surgical Injury Date Acquired: 11/16/2020 Weeks Of Treatment: 1 Clustered Wound: No Wound Measurements Length: (cm) 4 Width: (cm) 0.7 Depth: (cm) 0.3 Area: (cm) 2.199 Volume: (cm) 0.66 % Reduction in Area: 60% % Reduction in Volume: 70% Wound Description Classification: Full Thickness Without Exposed Support Structur es Treatment Notes Wound #1 (Lower Leg) Wound Laterality: Right, Medial Cleanser Wound Cleanser Discharge Instruction: Cleanse the wound with wound cleanser prior to applying a clean dressing using gauze sponges, not tissue or cotton balls. Soap and Water Discharge Instruction:  May shower and wash wound with dial antibacterial soap and water prior to dressing change. Peri-Wound Care Sween Lotion (Moisturizing lotion) Discharge Instruction: Apply moisturizing lotion as directed Topical Primary Dressing Santyl Ointment Discharge Instruction: Apply nickel thick amount to wound bed as instructed Hydrofera Blue Classic Foam, 4x4 in Discharge Instruction: Moisten with saline prior to applying to wound bed. Apply over Santyl. Secondary Dressing Woven Gauze Sponge, Non-Sterile 4x4 in Discharge Instruction: Apply over primary dressing as directed. ABD Pad, 5x9 Discharge Instruction: Apply over primary dressing as directed. Secured With Compression Wrap Kerlix Roll 4.5x3.1 (in/yd) Discharge Instruction: Apply Kerlix and Coban compression as directed. Coban Self-Adherent Wrap 4x5 (in/yd) Discharge Instruction: Apply over Kerlix as directed. Compression Stockings Add-Ons Electronic Signature(s) Signed: 12/20/2020 6:22:58 PM By: Rhae Hammock RN Entered By: Rhae Hammock on 12/18/2020 10:15:45 -------------------------------------------------------------------------------- Wound Assessment Details Patient Name: Date of Service: [name], [name] 12/18/2020 10:00 A M Medical Record Number: [id number] Patient  Account Number: [id number] Date of Birth/Sex: Treating RN: 1950-12-29 (70 y.o. [name], Lauren Primary Care Danaiya Steadman: [name] Other Clinician: Referring Silver Achey: Treating Lunah Losasso/Extender: Linton Flemings in Treatment: 1 Wound Status Wound Number: 2 Primary Etiology: Arterial Insufficiency Ulcer Wound Location: Right T Great oe Wound Status: Open Wounding Event: Gradually Appeared Date Acquired: 09/18/2020 Weeks Of Treatment: 1 Clustered Wound: No Wound Measurements Length: (cm) 1 Width: (cm) 1.5 Depth: (cm) 0.1 Area: (cm) 1.178 Volume: (cm) 0.118 % Reduction in Area: 32.1% % Reduction in Volume: 77.4% Wound Description Classification: Full Thickness Without Exposed Support Structur es Treatment Notes Wound #2 (Toe Great) Wound Laterality: Right Cleanser Soap and Water Discharge Instruction: May shower and wash wound with dial antibacterial soap and water prior to dressing change. Peri-Wound Care Topical Primary Dressing betadine Discharge Instruction: apply daily Secondary Dressing Secured With Compression Wrap Compression Stockings Add-Ons Electronic Signature(s) Signed: 12/20/2020 6:22:58 PM By: Rhae Hammock RN Entered By: Rhae Hammock on 12/18/2020 10:15:45 -------------------------------------------------------------------------------- Wound Assessment Details Patient Name: Date of Service: [name]. 12/18/2020 10:00 A M Medical Record Number: [id number] Patient Account Number: [id number] Date of Birth/Sex: Treating RN: 02-20-1951 (70 y.o. [name], Lauren Primary Care Torie Towle: [name] Other Clinician: Referring Atalia Litzinger: Treating Gerome Kokesh/Extender: Linton Flemings in Treatment: 1 Wound Status Wound Number: 3 Primary Etiology: Venous Leg Ulcer Wound Location: Right, Distal, Medial Lower Leg Wound Status: Open Wounding Event: Gradually Appeared Date Acquired:  12/11/2020 Weeks Of Treatment: 1 Clustered Wound: No Wound Measurements Length: (cm) 0.7 Width: (cm) 0.3 Depth: (cm) 0.2 Area: (cm) 0.165 Volume: (cm) 0.033 % Reduction in Area: 0% % Reduction in Volume: 0% Wound Description Classification: Full Thickness Without Exposed Support Structur es Treatment Notes Wound #3 (Lower Leg) Wound Laterality: Right, Medial, Distal Cleanser Wound Cleanser Discharge Instruction: Cleanse the wound with wound cleanser prior to applying a clean dressing using gauze sponges, not tissue or cotton balls. Soap and Water Discharge Instruction: May shower and wash wound with dial antibacterial soap and water prior to dressing change. Peri-Wound Care Sween Lotion (Moisturizing lotion) Discharge Instruction: Apply moisturizing lotion as directed Topical Primary Dressing Hydrofera Blue Classic Foam, 4x4 in Discharge Instruction: Moisten with saline prior to applying to wound bed Secondary Dressing Woven Gauze Sponge, Non-Sterile 4x4 in Discharge Instruction: Apply over primary dressing as directed. Secured With Compression Wrap Kerlix Roll 4.5x3.1 (in/yd) Discharge Instruction: Apply Kerlix and Coban compression as directed. Coban Self-Adherent Wrap 4x5 (in/yd) Discharge Instruction: Apply over Kerlix as directed. Compression Stockings Add-Ons Electronic Signature(s) Signed: 12/20/2020  6:22:58 PM By: Rhae Hammock RN Entered By: Rhae Hammock on 12/18/2020 10:15:45 -------------------------------------------------------------------------------- Vitals Details Patient Name: Date of Service: [name]. 12/18/2020 10:00 A M Medical Record Number: [id number] Patient Account Number: [id number] Date of Birth/Sex: Treating RN: [birth year] (70 y.o. [name], Lauren Primary Care Enes Wegener: [name] Other Clinician: Referring Donatello Kleve: Treating Babetta Paterson/Extender: Linton Flemings in Treatment: 1 Vital  Signs Time Taken: 10:12 Temperature (F): 97.9 Height (in): 72 Pulse (bpm): 90 Weight (lbs): 162 Respiratory Rate (breaths/min): 17 Body Mass Index (BMI): 22 Blood Pressure (mmHg): 114/68 Reference Range: 80 - 120 mg / dl Electronic Signature(s) Signed: 12/20/2020 6:22:58 PM By: Rhae Hammock RN Entered By: Rhae Hammock on 12/18/2020 10:14:53

## 2020-12-25 ENCOUNTER — Encounter (HOSPITAL_BASED_OUTPATIENT_CLINIC_OR_DEPARTMENT_OTHER): Payer: PPO | Admitting: Internal Medicine

## 2020-12-25 ENCOUNTER — Other Ambulatory Visit: Payer: Self-pay

## 2020-12-25 DIAGNOSIS — L97519 Non-pressure chronic ulcer of other part of right foot with unspecified severity: Secondary | ICD-10-CM | POA: Diagnosis not present

## 2020-12-25 DIAGNOSIS — L97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 with unspecified severity: Secondary | ICD-10-CM

## 2020-12-25 NOTE — Progress Notes (Signed)
[name], [name] ([id number]) Visit Report for 12/25/2020 Chief Complaint Document Details Patient Name: Date of Service: [name], [name] 12/25/2020 10:00 A M Medical Record Number: [id number] Patient Account Number: [id number] Date of Birth/Sex: Treating RN: 02-Oct-1950 (70 y.o. [name] Primary Care Provider: [name] Other Clinician: Referring Provider: Treating Provider/Extender: Bartholome Bill in Treatment: 2 Information Obtained from: Patient Chief Complaint Surgical wound dehiscence of right lower extremity and Great toe wound. Electronic Signature(s) Signed: 12/25/2020 11:13:35 AM By: Kalman Shan DO Entered By: Kalman Shan on 12/25/2020 11:09:46 -------------------------------------------------------------------------------- Debridement Details Patient Name: Date of Service: [name]. 12/25/2020 10:00 A M Medical Record Number: [id number] Patient Account Number: [id number] Date of Birth/Sex: Treating RN: Aug 24, 1950 (70 y.o. [name] Primary Care Provider: [name] Other Clinician: Referring Provider: Treating Provider/Extender: Bartholome Bill in Treatment: 2 Debridement Performed for Assessment: Wound #3 Right,Distal,Medial Lower Leg Performed By: Physician Kalman Shan, DO Debridement Type: Debridement Severity of Tissue Pre Debridement: Fat layer exposed Level of Consciousness (Pre-procedure): Awake and Alert Pre-procedure Verification/Time Out Yes - 10:41 Taken: Start Time: 10:42 T Area Debrided (L x W): otal 0.3 (cm) x 0.2 (cm) = 0.06 (cm) Tissue and other material debrided: Non-Viable, Skin: Dermis Level: Skin/Dermis Debridement Description: Selective/Open Wound Instrument: Curette Bleeding: Minimum Hemostasis Achieved: Pressure End Time: 10:44 Response to Treatment: Procedure was tolerated well Level of Consciousness (Post- Awake and Alert procedure): Post  Debridement Measurements of Total Wound Length: (cm) 0.3 Width: (cm) 0.2 Depth: (cm) 0 Volume: (cm) 0 Character of Wound/Ulcer Post Debridement: Stable Severity of Tissue Post Debridement: Fat layer exposed Post Procedure Diagnosis Same as Pre-procedure Electronic Signature(s) Signed: 12/25/2020 11:13:35 AM By: Kalman Shan DO Signed: 12/25/2020 6:31:05 PM By: [name] Entered By: [name] on 12/25/2020 10:47:54 -------------------------------------------------------------------------------- Debridement Details Patient Name: Date of Service: [name]. 12/25/2020 10:00 A M Medical Record Number: [id number] Patient Account Number: [id number] Date of Birth/Sex: Treating RN: 11-Feb-1951 (70 y.o. [name] Primary Care Provider: [name] Other Clinician: Referring Provider: Treating Provider/Extender: Bartholome Bill in Treatment: 2 Debridement Performed for Assessment: Wound #1 Right,Medial Lower Leg Performed By: Physician Kalman Shan, DO Debridement Type: Debridement Level of Consciousness (Pre-procedure): Awake and Alert Pre-procedure Verification/Time Out Yes - 10:41 Taken: Start Time: 10:44 T Area Debrided (L x W): otal 3 (cm) x 0.6 (cm) = 1.8 (cm) Tissue and other material debrided: Non-Viable, Slough, Subcutaneous, Slough Level: Skin/Subcutaneous Tissue Debridement Description: Excisional Instrument: Curette Bleeding: Minimum Hemostasis Achieved: Pressure End Time: 10:47 Response to Treatment: Procedure was tolerated well Level of Consciousness (Post- Awake and Alert procedure): Post Debridement Measurements of Total Wound Length: (cm) 3 Width: (cm) 0.6 Depth: (cm) 0.3 Volume: (cm) 0.424 Character of Wound/Ulcer Post Debridement: Stable Post Procedure Diagnosis Same as Pre-procedure Electronic Signature(s) Signed: 12/25/2020 11:13:35 AM By: Kalman Shan DO Signed: 12/25/2020 6:31:05 PM By:  [name] Entered By: [name] on 12/25/2020 10:48:40 -------------------------------------------------------------------------------- HPI Details Patient Name: Date of Service: [name]. 12/25/2020 10:00 A M Medical Record Number: [id number] Patient Account Number: [id number] Date of Birth/Sex: Treating RN: March 02, 1951 (70 y.o. [name] Primary Care Provider: [name] Other Clinician: Referring Provider: Treating Provider/Extender: Bartholome Bill in Treatment: 2 History of Present Illness HPI Description: Admission 6/2 Mr. [name] is a 70 year old male with a past medical history of peripheral arterial disease status post right common femoral to peroneal bypass on 10/13/2020 and coil  embolization of bypass fistula x2 on 11/01/2020. He presents today because he has wound dehiscence that occurred 3 weeks ago from his recent vascular surgery. He has been using wet-to-dry dressings to this area. He also has necrotic tissue to the right great toe. This was the original symptom that precipitated further vascular evaluation that led to his aortogram and ultimately bypass graft. He is currently keeping this area covered. He currently denies signs of infection. 6/6; patient presents for 1 week follow-up. He had Kerlix/Coban compression for the past week with Hydrofera Blue underneath. He reports tolerating this well without any issues. He thinks the wound looks smaller today. He has been using Betadine to the toe wound. He has no complaints today. He denies signs of infection. 6/20; patient presents for 2-week follow-up. He had Kerlix/Coban compression with Hydrofera Blue. He has tolerated this well. He reports significant improvement in leg swelling. He also thinks the wound looks smaller. He has no complaints today and denies signs of infection. Electronic Signature(s) Signed: 12/25/2020 11:13:35 AM By: Kalman Shan DO Entered By:  Kalman Shan on 12/25/2020 11:10:35 -------------------------------------------------------------------------------- Physical Exam Details Patient Name: Date of Service: [name], [name] 12/25/2020 10:00 A M Medical Record Number: [id number] Patient Account Number: [id number] Date of Birth/Sex: Treating RN: September 30, 1950 (70 y.o. [name] Primary Care Provider: [name] Other Clinician: Referring Provider: Treating Provider/Extender: Vivia Budge Weeks in Treatment: 2 Constitutional respirations regular, non-labored and within target range for patient.Marland Kitchen Psychiatric pleasant and cooperative. Notes Right lower extremity: 2+ pitting edema to the knee. Open wound to the medial aspect with nonviable tissue and granulation tissue present. No signs of infection. Right foot: The right great toe has an eschar present. No obvious signs of infection. Electronic Signature(s) Signed: 12/25/2020 11:13:35 AM By: Kalman Shan DO Entered By: Kalman Shan on 12/25/2020 11:11:15 -------------------------------------------------------------------------------- Physician Orders Details Patient Name: Date of Service: [name]. 12/25/2020 10:00 A M Medical Record Number: [id number] Patient Account Number: [id number] Date of Birth/Sex: Treating RN: 1950-11-11 (70 y.o. 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 - with Dr.  Heber  Return A Bathing/ Shower/ Hygiene May shower with protection but do not get wound dressing(s) wet. Edema Control - Lymphedema / SCD / Other Elevate legs to the level of the heart or above for 30 minutes daily and/or when sitting, a frequency of: - throughout the day Avoid standing for long periods of time. Exercise regularly Additional Orders / Instructions Follow Nutritious Diet Wound Treatment Wound #1 - Lower Leg Wound Laterality: Right, Medial Cleanser: Soap and Water 1 x Per Week/7 Days Discharge Instructions: May shower and wash wound with dial antibacterial soap and water prior to dressing change. Cleanser: Wound Cleanser (Generic) 1 x Per Week/7 Days Discharge Instructions: Cleanse the wound with wound cleanser prior to applying a clean dressing using gauze sponges, not tissue or cotton balls. Peri-Wound Care: Sween Lotion (Moisturizing lotion) 1 x Per Week/7 Days Discharge Instructions: Apply moisturizing lotion as directed Prim Dressing: Hydrofera Blue Classic Foam, 4x4 in 1 x Per Week/7 Days ary Discharge Instructions: Moisten  with saline prior to applying to wound bed. Apply over Santyl. Prim Dressing: Santyl Ointment 1 x Per Week/7 Days ary Discharge Instructions: Apply nickel thick amount to wound bed as instructed Secondary Dressing: Woven Gauze Sponge, Non-Sterile 4x4 in (Generic) 1 x Per Week/7 Days Discharge Instructions: Apply over primary dressing as directed. Secondary Dressing: ABD Pad, 5x9 1 x Per Week/7 Days Discharge Instructions: Apply over primary dressing as directed. Compression Wrap: Kerlix Roll 4.5x3.1 (in/yd) (Generic) 1 x Per Week/7 Days Discharge Instructions: Apply Kerlix and Coban compression as directed. Compression Wrap: Coban Self-Adherent Wrap 4x5 (in/yd) (Generic) 1 x Per Week/7 Days Discharge Instructions: Apply over Kerlix as directed. Wound #2 - T Great oe Wound Laterality: Right Cleanser: Soap and Water 1 x Per Day/7  Days Discharge Instructions: May shower and wash wound with dial antibacterial soap and water prior to dressing change. Prim Dressing: betadine 1 x Per Day/7 Days ary Discharge Instructions: apply daily Secondary Dressing: Woven Gauze Sponge, Non-Sterile 4x4 in 1 x Per Day/7 Days Discharge Instructions: Apply over primary dressing as directed. Secured With: 35M Medipore H Soft Cloth Surgical T 4 x 2 (in/yd) 1 x Per Day/7 Days ape Discharge Instructions: Secure dressing with tape as directed. Wound #3 - Lower Leg Wound Laterality: Right, Medial, Distal Cleanser: Soap and Water 1 x Per Week/7 Days Discharge Instructions: May shower and wash wound with dial antibacterial soap and water prior to dressing change. Cleanser: Wound Cleanser (Generic) 1 x Per Week/7 Days Discharge Instructions: Cleanse the wound with wound cleanser prior to applying a clean dressing using gauze sponges, not tissue or cotton balls. Peri-Wound Care: Sween Lotion (Moisturizing lotion) 1 x Per Week/7 Days Discharge Instructions: Apply moisturizing lotion as directed Prim Dressing: Hydrofera Blue Classic Foam, 4x4 in 1 x Per Week/7 Days ary Discharge Instructions: Moisten with saline prior to applying to wound bed Secondary Dressing: Woven Gauze Sponge, Non-Sterile 4x4 in (Generic) 1 x Per Week/7 Days Discharge Instructions: Apply over primary dressing as directed. Compression Wrap: Kerlix Roll 4.5x3.1 (in/yd) (Generic) 1 x Per Week/7 Days Discharge Instructions: Apply Kerlix and Coban compression as directed. Compression Wrap: Coban Self-Adherent Wrap 4x5 (in/yd) (Generic) 1 x Per Week/7 Days Discharge Instructions: Apply over Kerlix as directed. Electronic Signature(s) Signed: 12/25/2020 11:13:35 AM By: Kalman Shan DO Entered By: Kalman Shan on 12/25/2020 11:11:33 -------------------------------------------------------------------------------- Problem List Details Patient Name: Date of Service: [name]. 12/25/2020 10:00 A M Medical Record Number: [id number] Patient Account Number: [id number] Date of Birth/Sex: Treating RN: 1950/09/11 (69 y.o. 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 12/25/2020 10:00 A M Medical Record Number: [id number] Patient Account Number: [id number] Date of Birth/Sex: Treating RN: April 01, 1951 (70 y.o. [name] Primary Care Provider: [name] Other Clinician: Referring Provider: Treating  Provider/Extender: Vivia Budge Weeks in Treatment: 2 Subjective Chief Complaint Information obtained from Patient Surgical wound dehiscence of right lower extremity and Great toe wound. History of Present Illness (HPI) Admission 6/2 Mr. Inri Sobieski is a 70 year old male with a past medical history of peripheral arterial disease status post right common femoral to peroneal bypass on 10/13/2020 and coil embolization of bypass fistula x2  on 11/01/2020. He presents today because he has wound dehiscence that occurred 3 weeks ago from his recent vascular surgery. He has been using wet-to-dry dressings to this area. He also has necrotic tissue to the right great toe. This was the original symptom that precipitated further vascular evaluation that led to his aortogram and ultimately bypass graft. He is currently keeping this area covered. He currently denies signs of infection. 6/6; patient presents for 1 week follow-up. He had Kerlix/Coban compression for the past week with Hydrofera Blue underneath. He reports tolerating this well without any issues. He thinks the wound looks smaller today. He has been using Betadine to the toe wound. He has no complaints today. He denies signs of infection. 6/20; patient presents for 2-week follow-up. He had Kerlix/Coban compression with Hydrofera Blue. He has tolerated this well. He reports significant improvement in leg swelling. He also thinks the wound looks smaller. He has no complaints today and denies signs of infection. Patient History Information obtained from Patient. Family History Unknown History. Social History Never smoker, Marital Status - Married, Alcohol Use - Moderate, Drug Use - No History, Caffeine Use - Daily. Medical History Cardiovascular Patient has history of Coronary Artery Disease, Peripheral Arterial Disease, Peripheral Venous Disease Objective Constitutional respirations regular, non-labored and within target range for patient.. Vitals Time Taken: 10:35 AM, Height: 72 in, Weight: 162 lbs, BMI: 22, Temperature: 98.1 F, Pulse: 76 bpm, Respiratory Rate: 16 breaths/min, Blood Pressure: 162/84 mmHg. Psychiatric pleasant and cooperative. General Notes: Right lower extremity: 2+ pitting edema to the knee. Open wound to the medial aspect with nonviable tissue and granulation tissue present. No signs of infection. Right foot: The right great toe has an eschar present. No  obvious signs of infection. Integumentary (Hair, Skin) Wound #1 status is Open. Original cause of wound was Surgical Injury. The date acquired was: 11/16/2020. The wound has been in treatment 2 weeks. The wound is located on the Right,Medial Lower Leg. The wound measures 3cm length x 0.6cm width x 0.3cm depth; 1.414cm^2 area and 0.424cm^3 volume. There is Fat Layer (Subcutaneous Tissue) exposed. There is no tunneling or undermining noted. There is a medium amount of serosanguineous drainage noted. The wound margin is well defined and not attached to the wound base. There is large (67-100%) red granulation within the wound bed. There is a small (1-33%) amount of necrotic tissue within the wound bed including Adherent Slough. Wound #2 status is Open. Original cause of wound was Gradually Appeared. The date acquired was: 09/18/2020. The wound has been in treatment 2 weeks. The wound is located on the Right T Great. The wound measures 1cm length x 1.5cm width x 0.1cm depth; 1.178cm^2 area and 0.118cm^3 volume. There is Fat oe Layer (Subcutaneous Tissue) exposed. There is no tunneling or undermining noted. There is a small amount of serosanguineous drainage noted. The wound margin is distinct with the outline attached to the wound base. There is no granulation within the wound bed. There is a large (67-100%) amount of necrotic tissue within the wound bed including Eschar. Wound #3 status is Open. Original cause of  wound was Gradually Appeared. The date acquired was: 12/11/2020. The wound has been in treatment 2 weeks. The wound is located on the Right,Distal,Medial Lower Leg. The wound measures 0.3cm length x 0.2cm width x 0.1cm depth; 0.047cm^2 area and 0.005cm^3 volume. There is Fat Layer (Subcutaneous Tissue) exposed. There is no tunneling or undermining noted. There is a medium amount of serosanguineous drainage noted. The wound margin is distinct with the outline attached to the wound base. There is  large (67-100%) red, pink granulation within the wound bed. There is no necrotic tissue within the wound bed. 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 I debrided the nonviable tissue. There are some gritty aspects to the wound bed however overall this is improving nicely. We will continue with Santyl, Hydrofera Blue under Kerlix/Coban compression. He will follow-up next week. Procedures Wound #1 Pre-procedure diagnosis of Wound #1 is a Dehisced Wound located on the Right,Medial Lower Leg . There was a Excisional Skin/Subcutaneous Tissue Debridement with a total area of 1.8 sq cm performed by Kalman Shan, DO. With the following instrument(s): Curette to remove Non-Viable tissue/material. Material removed includes Subcutaneous Tissue and Slough and. No specimens were taken. A time out was conducted at 10:41, prior to the start of the procedure. A Minimum amount of bleeding was controlled with Pressure. The procedure was tolerated well. Post Debridement Measurements: 3cm length x 0.6cm width x 0.3cm depth; 0.424cm^3 volume. Character of Wound/Ulcer Post Debridement is stable. Post procedure Diagnosis Wound #1: Same as Pre-Procedure Wound #3 Pre-procedure diagnosis of Wound #3 is a Venous Leg Ulcer located on the Right,Distal,Medial Lower Leg .Severity of Tissue Pre Debridement is: Fat layer exposed. There was a Selective/Open Wound Skin/Dermis Debridement with a total area of 0.06 sq cm performed by Kalman Shan, DO. With the following instrument(s): Curette to remove Non-Viable tissue/material. Material removed includes Skin: Dermis. No specimens were taken. A time out was conducted  at 10:41, prior to the start of the procedure. A Minimum amount of bleeding was controlled with Pressure. The procedure was tolerated well. Post Debridement Measurements: 0.3cm length x 0.2cm width x 0cm depth; 0cm^3 volume. Character of Wound/Ulcer Post Debridement is stable. Severity of Tissue Post Debridement is: Fat layer exposed. Post procedure Diagnosis Wound #3: Same as Pre-Procedure Plan Follow-up Appointments: Return Appointment in 1 week. - with Dr. Heber [place] Bathing/ Shower/ Hygiene: May shower with protection but do not get wound dressing(s) wet. Edema Control - Lymphedema / SCD / Other: Elevate legs to the level of the heart or above for 30 minutes daily and/or when sitting, a frequency of: - throughout the day Avoid standing for long periods of time. Exercise regularly Additional Orders / Instructions: Follow Nutritious Diet WOUND #1: - Lower Leg Wound Laterality: Right, Medial Cleanser: Soap and Water 1 x Per Week/7 Days Discharge Instructions: May shower and wash wound with dial antibacterial soap and water prior to dressing change. Cleanser: Wound Cleanser (Generic) 1 x Per Week/7 Days Discharge Instructions: Cleanse the wound with wound cleanser prior to applying a clean dressing using gauze sponges, not tissue or cotton balls. Peri-Wound Care: Sween Lotion (Moisturizing lotion) 1 x Per Week/7 Days Discharge Instructions: Apply moisturizing lotion as directed Prim Dressing: Hydrofera Blue Classic Foam, 4x4 in 1 x Per Week/7 Days ary  Discharge Instructions: Moisten with saline prior to applying to wound bed. Apply over Santyl. Prim Dressing: Santyl Ointment 1 x Per Week/7 Days ary Discharge Instructions: Apply nickel thick amount to wound bed as instructed Secondary Dressing: Woven Gauze Sponge, Non-Sterile 4x4 in (Generic) 1 x Per Week/7 Days Discharge Instructions: Apply over primary dressing as directed. Secondary Dressing: ABD Pad, 5x9 1 x Per Week/7 Days Discharge  Instructions: Apply over primary dressing as directed. Com pression Wrap: Kerlix Roll 4.5x3.1 (in/yd) (Generic) 1 x Per Week/7 Days Discharge Instructions: Apply Kerlix and Coban compression as directed. Com pression Wrap: Coban Self-Adherent Wrap 4x5 (in/yd) (Generic) 1 x Per Week/7 Days Discharge Instructions: Apply over Kerlix as directed. WOUND #2: - T Great Wound Laterality: Right oe Cleanser: Soap and Water 1 x Per Day/7 Days Discharge Instructions: May shower and wash wound with dial antibacterial soap and water prior to dressing change. Prim Dressing: betadine 1 x Per Day/7 Days ary Discharge Instructions: apply daily Secondary Dressing: Woven Gauze Sponge, Non-Sterile 4x4 in 1 x Per Day/7 Days Discharge Instructions: Apply over primary dressing as directed. Secured With: 32M Medipore H Soft Cloth Surgical T 4 x 2 (in/yd) 1 x Per Day/7 Days ape Discharge Instructions: Secure dressing with tape as directed. WOUND #3: - Lower Leg Wound Laterality: Right, Medial, Distal Cleanser: Soap and Water 1 x Per Week/7 Days Discharge Instructions: May shower and wash wound with dial antibacterial soap and water prior to dressing change. Cleanser: Wound Cleanser (Generic) 1 x Per Week/7 Days Discharge Instructions: Cleanse the wound with wound cleanser prior to applying a clean dressing using gauze sponges, not tissue or cotton balls. Peri-Wound Care: Sween Lotion (Moisturizing lotion) 1 x Per Week/7 Days Discharge Instructions: Apply moisturizing lotion as directed Prim Dressing: Hydrofera Blue Classic Foam, 4x4 in 1 x Per Week/7 Days ary Discharge Instructions: Moisten with saline prior to applying to wound bed Secondary Dressing: Woven Gauze Sponge, Non-Sterile 4x4 in (Generic) 1 x Per Week/7 Days Discharge Instructions: Apply over primary dressing as directed. Com pression Wrap: Kerlix Roll 4.5x3.1 (in/yd) (Generic) 1 x Per Week/7 Days Discharge Instructions: Apply Kerlix and Coban  compression as directed. Com pression Wrap: Coban Self-Adherent Wrap 4x5 (in/yd) (Generic) 1 x Per Week/7 Days Discharge Instructions: Apply over Kerlix as directed. 1. In office sharp debridement 2. Santyl, Hydrofera Blue under Kerlix/Coban 3. Follow-up in 1 week Electronic Signature(s) Signed: 12/25/2020 11:13:35 AM By: Kalman Shan DO Entered By: Kalman Shan on 12/25/2020 11:12:37 -------------------------------------------------------------------------------- HxROS Details Patient Name: Date of Service: [name]. 12/25/2020 10:00 A M Medical Record Number: [id number] Patient Account Number: [id number] Date of Birth/Sex: Treating RN: 05-Aug-1950 (70 y.o. [name] Primary Care Provider: [name] Other Clinician: Referring Provider: Treating Provider/Extender: Bartholome Bill in Treatment: 2 Information Obtained From Patient Cardiovascular Medical History: Positive for: Coronary Artery Disease; Peripheral Arterial Disease; Peripheral Venous Disease Immunizations Pneumococcal Vaccine: Received Pneumococcal Vaccination: Yes Implantable Devices None Family and Social History Unknown History: Yes; Never smoker; Marital Status - Married; Alcohol Use: Moderate; Drug Use: No History; Caffeine Use: Daily; Financial Concerns: No; Food, Clothing or Shelter Needs: No; Support System Lacking: No; Transportation Concerns: No Electronic Signature(s) Signed: 12/25/2020 11:13:35 AM By: Kalman Shan DO Signed: 12/25/2020 5:37:12 PM By: Levan Hurst RN, BSN Entered By: Kalman Shan on 12/25/2020 11:10:41 -------------------------------------------------------------------------------- Bieber Details Patient Name: Date of Service: [name], [name] 12/25/2020 Medical Record Number: [id number] Patient Account Number: [id number] Date of Birth/Sex: Treating RN: August 09, 1950 (70 y.o.  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 I73.9 Peripheral vascular disease, unspecified Facility Procedures CPT4 Code: 86754492 Description: 01007 - DEB SUBQ TISSUE 20 SQ CM/< ICD-10 Diagnosis Description 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 with unspecified seve Modifier: rity Quantity: 1 CPT4 Code: 12197588 Description: 32549 - DEBRIDE WOUND 1ST 20 SQ CM OR < ICD-10 Diagnosis Description 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

## 2020-12-28 NOTE — Progress Notes (Signed)
[name], [name] ([id number]) Visit Report for 12/25/2020 Arrival Information Details Patient Name: Date of Service: [name], [name] 12/25/2020 10:00 A M Medical Record Number: [id number] Patient Account Number: [id number] Date of Birth/Sex: Treating RN: 1950/08/26 (70 y.o. [name] Primary Care Rally Ouch: [name] Other Clinician: Referring Ines Warf: Treating Ladamien Rammel/Extender: Bartholome Bill in Treatment: 2 Visit Information History Since Last Visit Added or deleted any medications: No Patient Arrived: Ambulatory Any new allergies or adverse reactions: No Arrival Time: 10:30 Had a fall or experienced change in No Transfer Assistance: None activities of daily living that may affect Patient Identification Verified: Yes risk of falls: Secondary Verification Process Completed: Yes Signs or symptoms of abuse/neglect since last visito No Patient Requires Transmission-Based Precautions: No Hospitalized since last visit: No Patient Has Alerts: Yes Implantable device outside of the clinic excluding No Patient Alerts: Patient on Blood Thinner cellular tissue based products placed in the center ABI R=0.95 since last visit: ABI L=1.21 Has Dressing in Place as Prescribed: Yes Has Compression in Place as Prescribed: Yes Pain Present Now: No Electronic Signature(s) Signed: 12/25/2020 6:31:05 PM By: [name] Entered By: [name] on 12/25/2020 10:35:05 -------------------------------------------------------------------------------- Encounter Discharge Information Details Patient Name: Date of Service: Wyatt Loth. 12/25/2020 10:00 A M Medical Record Number: [id number] Patient Account Number: [id number] Date of Birth/Sex: Treating RN: Feb 06, 1951 (70 y.o. [name] Primary Care Shanieka Blea: [name] Other Clinician: Referring Medardo Hassing: Treating Harlee Eckroth/Extender: Bartholome Bill in Treatment: 2 Encounter  Discharge Information Items Post Procedure Vitals Discharge Condition: Stable Temperature (F): 98.1 Ambulatory Status: Ambulatory Pulse (bpm): 76 Discharge Destination: Home Respiratory Rate (breaths/min): 16 Transportation: Private Auto Blood Pressure (mmHg): 162/84 Schedule Follow-up Appointment: Yes Clinical Summary of Care: Provided on 12/25/2020 Form Type Recipient Paper Patient Patient Electronic Signature(s) Signed: 12/25/2020 6:31:05 PM By: [name] Entered By: [name] on 12/25/2020 10:54:14 -------------------------------------------------------------------------------- Lower Extremity Assessment Details Patient Name: Date of Service: [name], [name] 12/25/2020 10:00 A M Medical Record Number: [id number] Patient Account Number: [id number] Date of Birth/Sex: Treating RN: 01-31-1951 (70 y.o. [name] Primary Care Reagen Haberman: [name] Other Clinician: Referring Kajah Santizo: Treating Yeraldi Fidler/Extender: Vivia Budge Weeks in Treatment: 2 Edema Assessment Assessed: Shirlyn Goltz: No] Patrice Paradise: Yes] Edema: [Left: Ye] [Right: s] Calf Left: Right: Point of Measurement: From Medial Instep 36 cm Ankle Left: Right: Point of Measurement: From Medial Instep 26 cm Vascular Assessment Pulses: Dorsalis Pedis Palpable: [Right:Yes] Electronic Signature(s) Signed: 12/25/2020 6:31:05 PM By: [name] Entered By: [name] on 12/25/2020 10:38:27 -------------------------------------------------------------------------------- Multi Wound Chart Details Patient Name: Date of Service: Wyatt Loth. 12/25/2020 10:00 A M Medical Record Number: [id number] Patient Account Number: [id number] Date of Birth/Sex: Treating RN: [birth year] (70 y.o. [name] Primary Care Jevaughn Degollado: [name] Other Clinician: Referring Briant Angelillo: Treating Jerimah Witucki/Extender: Bartholome Bill in Treatment: 2 Vital Signs Height(in):  72 Pulse(bpm): 76 Weight(lbs): 162 Blood Pressure(mmHg): 162/84 Body Mass Index(BMI): 22 Temperature(F): 98.1 Respiratory Rate(breaths/min): 16 Photos: [1:No Photos Right, Medial Lower Leg] [2:No Photos Right T Great oe] [3:No Photos Right, Distal, Medial Lower Leg] Wound Location: [1:Surgical Injury] [2:Gradually Appeared] [3:Gradually Appeared] Wounding Event: [1:Dehisced Wound] [2:Arterial Insufficiency Ulcer] [3:Venous Leg Ulcer] Primary Etiology: [1:Coronary Artery Disease, Peripheral] [2:Coronary Artery Disease, Peripheral] [3:Coronary Artery Disease, Peripheral] Comorbid History: [1:Arterial Disease, Peripheral Venous Disease 11/16/2020] [2:Arterial Disease, Peripheral Venous Disease 09/18/2020] [3:Arterial Disease, Peripheral Venous Disease 12/11/2020] Date Acquired: [1:2] [2:2] [3:2] Weeks of Treatment: [1:Open] [2:Open] [3:Open] Wound  Status: [1:3x0.6x0.3] [2:1x1.5x0.1] [3:0.3x0.2x0.1] Measurements L x W x D (cm) [1:1.414] [2:1.178] [3:0.047] A (cm) : rea [1:0.424] [2:0.118] [3:0.005] Volume (cm) : [1:74.30%] [2:32.10%] [3:71.50%] % Reduction in Area: [1:80.70%] [2:77.40%] [3:84.80%] % Reduction in Volume: [1:Full Thickness Without Exposed] [2:Full Thickness Without Exposed] [3:Full Thickness Without Exposed] Classification: [1:Support Structures Medium] [2:Support Structures Small] [3:Support Structures Medium] Exudate A mount: [1:Serosanguineous] [2:Serosanguineous] [3:Serosanguineous] Exudate Type: [1:red, brown] [2:red, brown] [3:red, brown] Exudate Color: [1:Well defined, not attached] [2:Distinct, outline attached] [3:Distinct, outline attached] Wound Margin: [1:Large (67-100%)] [2:None Present (0%)] [3:Large (67-100%)] Granulation A mount: [1:Red] [2:[name]] [3:Red, Pink] Granulation Quality: [1:Small (1-33%)] [2:Large (67-100%)] [3:None Present (0%)] Necrotic A mount: [1:Adherent Slough] [2:Eschar] [3:[name]] Necrotic Tissue: [1:Fat Layer (Subcutaneous Tissue): Yes Fat Layer  (Subcutaneous Tissue): Yes Fat Layer (Subcutaneous Tissue): Yes] Exposed Structures: [1:Fascia: No Tendon: No Muscle: No Joint: No Bone: No [name]] [2:Fascia: No Tendon: No Muscle: No Joint: No Bone: No [name]] [3:Fascia: No Tendon: No Muscle: No Joint: No Bone: No Medium (34-66%)] Epithelialization: [1:Debridement - Excisional] [2:[name]] [3:Debridement - Selective/Open Wound] Debridement: Pre-procedure Verification/Time Out 10:41 [2:[name]] [3:10:41] Taken: [1:Subcutaneous, Slough] [2:[name]] [3:[name]] Tissue Debrided: [1:Skin/Subcutaneous Tissue] [2:[name]] [3:Skin/Dermis] Level: [1:1.8] [2:[name]] [3:0.06] Debridement A (sq cm): [1:rea Curette] [2:[name]] [3:Curette] Instrument: [1:Minimum] [2:[name]] [3:Minimum] Bleeding: [1:Pressure] [2:[name]] [3:Pressure] Hemostasis A chieved: [1:Procedure was tolerated well] [2:[name]] [3:Procedure was tolerated well] Debridement Treatment Response: [1:3x0.6x0.3] [2:[name]] [3:0.3x0.2x0] Post Debridement Measurements L x W x D (cm) [1:0.424] [2:[name]] [3:0] Post Debridement Volume: (cm) [1:Debridement] [2:[name]] [3:Debridement] Treatment Notes Wound #1 (Lower Leg) Wound Laterality: Right, Medial Cleanser Soap and Water Discharge Instruction: May shower and wash wound with dial antibacterial soap and water prior to dressing change. Wound Cleanser Discharge Instruction: Cleanse the wound with wound cleanser prior to applying a clean dressing using gauze sponges, not tissue or cotton balls. Peri-Wound Care Sween Lotion (Moisturizing lotion) Discharge Instruction: Apply moisturizing lotion as directed Topical Primary Dressing Hydrofera Blue Classic Foam, 4x4 in Discharge Instruction: Moisten with saline prior to applying to wound bed. Apply over Santyl. Santyl Ointment Discharge Instruction: Apply nickel thick amount to wound bed as instructed Secondary Dressing Woven Gauze Sponge, Non-Sterile 4x4 in Discharge Instruction: Apply over primary dressing as directed. ABD Pad,  5x9 Discharge Instruction: Apply over primary dressing as directed. Secured With Compression Wrap Kerlix Roll 4.5x3.1 (in/yd) Discharge Instruction: Apply Kerlix and Coban compression as directed. Coban Self-Adherent Wrap 4x5 (in/yd) Discharge Instruction: Apply over Kerlix as directed. Compression Stockings Add-Ons Wound #2 (Toe Great) Wound Laterality: Right Cleanser Soap and Water Discharge Instruction: May shower and wash wound with dial antibacterial soap and water prior to dressing change. Peri-Wound Care Topical Primary Dressing betadine Discharge Instruction: apply daily Secondary Dressing Woven Gauze Sponge, Non-Sterile 4x4 in Discharge Instruction: Apply over primary dressing as directed. Secured With 18M Medipore H Soft Cloth Surgical T 4 x 2 (in/yd) ape Discharge Instruction: Secure dressing with tape as directed. Compression Wrap Compression Stockings Add-Ons Wound #3 (Lower Leg) Wound Laterality: Right, Medial, Distal Cleanser Soap and Water Discharge Instruction: May shower and wash wound with dial antibacterial soap and water prior to dressing change. Wound Cleanser Discharge Instruction: Cleanse the wound with wound cleanser prior to applying a clean dressing using gauze sponges, not tissue or cotton balls. Peri-Wound Care Sween Lotion (Moisturizing lotion) Discharge Instruction: Apply moisturizing lotion as directed Topical Primary Dressing Hydrofera Blue Classic Foam, 4x4 in Discharge Instruction: Moisten with saline prior to applying to wound bed Secondary Dressing Woven Gauze Sponge, Non-Sterile 4x4 in Discharge Instruction: Apply over  primary dressing as directed. Secured With Compression Wrap Kerlix Roll 4.5x3.1 (in/yd) Discharge Instruction: Apply Kerlix and Coban compression as directed. Coban Self-Adherent Wrap 4x5 (in/yd) Discharge Instruction: Apply over Kerlix as directed. Compression Stockings Add-Ons Electronic Signature(s) Signed:  12/25/2020 11:13:35 AM By: Kalman Shan DO Signed: 12/25/2020 5:37:12 PM By: Levan Hurst RN, BSN Entered By: Kalman Shan on 12/25/2020 11:08:54 -------------------------------------------------------------------------------- Multi-Disciplinary Care Plan Details Patient Name: Date of Service: KYROLLOS, CORDELL 12/25/2020 10:00 A M Medical Record Number: [id number] Patient Account Number: [id number] Date of Birth/Sex: Treating RN: 12-09-1950 (70 y.o. [name] Primary Care Jameisha Stofko: [name] Other Clinician: Referring Dequavious Harshberger: Treating Patsie Mccardle/Extender: Bartholome Bill in Treatment: 2 Active Inactive Wound/Skin Impairment Nursing Diagnoses: Impaired tissue integrity Knowledge deficit related to ulceration/compromised skin integrity Goals: Patient will have a decrease in wound volume by X% from date: (specify in notes) Date Initiated: 12/07/2020 Target Resolution Date: 01/13/2021 Goal Status: Active Patient/caregiver will verbalize understanding of skin care regimen Date Initiated: 12/07/2020 Target Resolution Date: 01/11/2021 Goal Status: Active Ulcer/skin breakdown will have a volume reduction of 30% by week 4 Date Initiated: 12/07/2020 Target Resolution Date: 01/13/2021 Goal Status: Active Interventions: Assess patient/caregiver ability to obtain necessary supplies Assess patient/caregiver ability to perform ulcer/skin care regimen upon admission and as needed Assess ulceration(s) every visit Notes: Electronic Signature(s) Signed: 12/25/2020 6:31:05 PM By: [name] Entered By: [name] on 12/25/2020 10:50:33 -------------------------------------------------------------------------------- Pain Assessment Details Patient Name: Date of Service: Wyatt Loth. 12/25/2020 10:00 A M Medical Record Number: [id number] Patient Account Number: [id number] Date of Birth/Sex: Treating RN: [birth year] (70 y.o. [name] Primary  Care Lasondra Hodgkins: [name] Other Clinician: Referring Arhaan Chesnut: Treating Doneshia Hill/Extender: Bartholome Bill in Treatment: 2 Active Problems Location of Pain Severity and Description of Pain Patient Has Paino No Site Locations Pain Management and Medication Current Pain Management: Electronic Signature(s) Signed: 12/25/2020 6:31:05 PM By: [name] Entered By: [name] on 12/25/2020 10:35:38 -------------------------------------------------------------------------------- Patient/Caregiver Education Details Patient Name: Date of Service: [name], Wyatt YNE L. 6/20/2022andnbsp10:00 [place] Record Number: [id number] Patient Account Number: [id number] Date of Birth/Gender: Treating RN: [birth year] (70 y.o. [name] Primary Care Physician: [name] Other Clinician: Referring Physician: Treating Physician/Extender: Bartholome Bill in Treatment: 2 Education Assessment Education Provided To: Patient Education Topics Provided Venous: Methods: Explain/Verbal, Printed Responses: State content correctly Wound/Skin Impairment: Methods: Explain/Verbal, Printed Responses: State content correctly Electronic Signature(s) Signed: 12/25/2020 6:31:05 PM By: [name] Entered By: [name] on 12/25/2020 10:51:58 -------------------------------------------------------------------------------- Wound Assessment Details Patient Name: Date of Service: Wyatt Loth. 12/25/2020 10:00 A M Medical Record Number: [id number] Patient Account Number: [id number] Date of Birth/Sex: Treating RN: 07-29-1950 (70 y.o. [name] Primary Care Kenyon Eshleman: [name] Other Clinician: Referring Hasten Sweitzer: Treating Enis Riecke/Extender: Vivia Budge Weeks in Treatment: 2 Wound Status Wound Number: 1 Primary Dehisced Wound Etiology: Wound Location: Right, Medial Lower Leg Wound Open Wounding Event:  Surgical Injury Status: Date Acquired: 11/16/2020 Comorbid Coronary Artery Disease, Peripheral Arterial Disease, Weeks Of Treatment: 2 History: Peripheral Venous Disease Clustered Wound: No Photos Wound Measurements Length: (cm) 3 Width: (cm) 0.6 Depth: (cm) 0.3 Area: (cm) 1.414 Volume: (cm) 0.424 % Reduction in Area: 74.3% % Reduction in Volume: 80.7% Tunneling: No Undermining: No Wound Description Classification: Full Thickness Without Exposed Support Structures Wound Margin: Well defined, not attached Exudate Amount: Medium Exudate Type: Serosanguineous Exudate Color: red, brown Foul Odor After Cleansing: No Slough/Fibrino Yes Wound Bed Granulation  Amount: Large (67-100%) Exposed Structure Granulation Quality: Red Fascia Exposed: No Necrotic Amount: Small (1-33%) Fat Layer (Subcutaneous Tissue) Exposed: Yes Necrotic Quality: Adherent Slough Tendon Exposed: No Muscle Exposed: No Joint Exposed: No Bone Exposed: No Treatment Notes Wound #1 (Lower Leg) Wound Laterality: Right, Medial Cleanser Soap and Water Discharge Instruction: May shower and wash wound with dial antibacterial soap and water prior to dressing change. Wound Cleanser Discharge Instruction: Cleanse the wound with wound cleanser prior to applying a clean dressing using gauze sponges, not tissue or cotton balls. Peri-Wound Care Sween Lotion (Moisturizing lotion) Discharge Instruction: Apply moisturizing lotion as directed Topical Primary Dressing Hydrofera Blue Classic Foam, 4x4 in Discharge Instruction: Moisten with saline prior to applying to wound bed. Apply over Santyl. Santyl Ointment Discharge Instruction: Apply nickel thick amount to wound bed as instructed Secondary Dressing Woven Gauze Sponge, Non-Sterile 4x4 in Discharge Instruction: Apply over primary dressing as directed. ABD Pad, 5x9 Discharge Instruction: Apply over primary dressing as directed. Secured With Compression  Wrap Kerlix Roll 4.5x3.1 (in/yd) Discharge Instruction: Apply Kerlix and Coban compression as directed. Coban Self-Adherent Wrap 4x5 (in/yd) Discharge Instruction: Apply over Kerlix as directed. Compression Stockings Add-Ons Electronic Signature(s) Signed: 12/25/2020 6:31:05 PM By: [name] Signed: 12/28/2020 8:38:46 AM By: Leane Call Entered By: Leane Call on 12/25/2020 15:10:11 -------------------------------------------------------------------------------- Wound Assessment Details Patient Name: Date of Service: Wyatt Loth. 12/25/2020 10:00 A M Medical Record Number: [id number] Patient Account Number: [id number] Date of Birth/Sex: Treating RN: [birth year] (70 y.o. [name] Primary Care Griffin Gerrard: [name] Other Clinician: Referring Kenneth Lax: Treating Ruth Tully/Extender: Vivia Budge Weeks in Treatment: 2 Wound Status Wound Number: 2 Primary Arterial Insufficiency Ulcer Etiology: Wound Location: Right T Great oe Wound Open Wounding Event: Gradually Appeared Status: Date Acquired: 09/18/2020 Comorbid Coronary Artery Disease, Peripheral Arterial Disease, Weeks Of Treatment: 2 History: Peripheral Venous Disease Clustered Wound: No Photos Wound Measurements Length: (cm) 1 Width: (cm) 1.5 Depth: (cm) 0.1 Area: (cm) 1.178 Volume: (cm) 0.118 % Reduction in Area: 32.1% % Reduction in Volume: 77.4% Tunneling: No Undermining: No Wound Description Classification: Full Thickness Without Exposed Support Structures Wound Margin: Distinct, outline attached Exudate Amount: Small Exudate Type: Serosanguineous Exudate Color: red, brown Foul Odor After Cleansing: No Slough/Fibrino No Wound Bed Granulation Amount: None Present (0%) Exposed Structure Necrotic Amount: Large (67-100%) Fascia Exposed: No Necrotic Quality: Eschar Fat Layer (Subcutaneous Tissue) Exposed: Yes Tendon Exposed: No Muscle Exposed: No Joint Exposed:  No Bone Exposed: No Treatment Notes Wound #2 (Toe Great) Wound Laterality: Right Cleanser Soap and Water Discharge Instruction: May shower and wash wound with dial antibacterial soap and water prior to dressing change. Peri-Wound Care Topical Primary Dressing betadine Discharge Instruction: apply daily Secondary Dressing Woven Gauze Sponge, Non-Sterile 4x4 in Discharge Instruction: Apply over primary dressing as directed. Secured With 55M Medipore H Soft Cloth Surgical T 4 x 2 (in/yd) ape Discharge Instruction: Secure dressing with tape as directed. Compression Wrap Compression Stockings Add-Ons Electronic Signature(s) Signed: 12/25/2020 6:31:05 PM By: [name] Signed: 12/28/2020 8:38:46 AM By: Leane Call Entered By: Leane Call on 12/25/2020 15:11:07 -------------------------------------------------------------------------------- Wound Assessment Details Patient Name: Date of Service: Wyatt Loth. 12/25/2020 10:00 A M Medical Record Number: [id number] Patient Account Number: [id number] Date of Birth/Sex: Treating RN: 28-Jul-1950 (70 y.o. [name] Primary Care Marion Seese: [name] Other Clinician: Referring Fawna Cranmer: Treating Vishnu Moeller/Extender: Vivia Budge Weeks in Treatment: 2 Wound Status Wound Number: 3 Primary Venous Leg Ulcer Etiology: Wound Location: Right, Distal, Medial  Lower Leg Wound Open Wounding Event: Gradually Appeared Status: Date Acquired: 12/11/2020 Comorbid Coronary Artery Disease, Peripheral Arterial Disease, Weeks Of Treatment: 2 History: Peripheral Venous Disease Clustered Wound: No Photos Wound Measurements Length: (cm) 0.3 Width: (cm) 0.2 Depth: (cm) 0.1 Area: (cm) 0.047 Volume: (cm) 0.005 % Reduction in Area: 71.5% % Reduction in Volume: 84.8% Epithelialization: Medium (34-66%) Tunneling: No Undermining: No Wound Description Classification: Full Thickness Without Exposed Support  Structures Wound Margin: Distinct, outline attached Exudate Amount: Medium Exudate Type: Serosanguineous Exudate Color: red, brown Foul Odor After Cleansing: No Slough/Fibrino No Wound Bed Granulation Amount: Large (67-100%) Exposed Structure Granulation Quality: Red, Pink Fascia Exposed: No Necrotic Amount: None Present (0%) Fat Layer (Subcutaneous Tissue) Exposed: Yes Tendon Exposed: No Muscle Exposed: No Joint Exposed: No Bone Exposed: No Treatment Notes Wound #3 (Lower Leg) Wound Laterality: Right, Medial, Distal Cleanser Soap and Water Discharge Instruction: May shower and wash wound with dial antibacterial soap and water prior to dressing change. Wound Cleanser Discharge Instruction: Cleanse the wound with wound cleanser prior to applying a clean dressing using gauze sponges, not tissue or cotton balls. Peri-Wound Care Sween Lotion (Moisturizing lotion) Discharge Instruction: Apply moisturizing lotion as directed Topical Primary Dressing Hydrofera Blue Classic Foam, 4x4 in Discharge Instruction: Moisten with saline prior to applying to wound bed Secondary Dressing Woven Gauze Sponge, Non-Sterile 4x4 in Discharge Instruction: Apply over primary dressing as directed. Secured With Compression Wrap Kerlix Roll 4.5x3.1 (in/yd) Discharge Instruction: Apply Kerlix and Coban compression as directed. Coban Self-Adherent Wrap 4x5 (in/yd) Discharge Instruction: Apply over Kerlix as directed. Compression Stockings Add-Ons Electronic Signature(s) Signed: 12/25/2020 6:31:05 PM By: [name] Signed: 12/28/2020 8:38:46 AM By: Leane Call Entered By: Leane Call on 12/25/2020 15:09:28 -------------------------------------------------------------------------------- Vitals Details Patient Name: Date of Service: Wyatt Loth. 12/25/2020 10:00 A M Medical Record Number: [id number] Patient Account Number: [id number] Date of Birth/Sex: Treating RN: 02-02-1951 (70  y.o. [name] Primary Care Aubree Doody: [name] Other Clinician: Referring Avy Barlett: Treating Joriel Streety/Extender: Bartholome Bill in Treatment: 2 Vital Signs Time Taken: 10:35 Temperature (F): 98.1 Height (in): 72 Pulse (bpm): 76 Weight (lbs): 162 Respiratory Rate (breaths/min): 16 Body Mass Index (BMI): 22 Blood Pressure (mmHg): 162/84 Reference Range: 80 - 120 mg / dl Electronic Signature(s) Signed: 12/25/2020 6:31:05 PM By: [name] Entered By: [name] on 12/25/2020 10:35:32

## 2020-12-31 NOTE — Progress Notes (Signed)
[facility] [place] PROGRESS NOTE  ASSESSMENT / PLAN: [name] is a 69 y.o. 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 His right great toe appears about the same. His right calf wound is much improved thanks to the wound center.  We had a lengthy discussion today about options going forward.  I am suspicious that his distal phalanx is involved and will need debridement.  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  This will require coil embolization.  He thinks things are getting better and would like to wait a month.  I think this is appropriate given the lack of infection.  I counseled the patient to continue his medical therapy and to continue wound care which seems to be helping him greatly.  I will see him again in a month.  If his wound is no better at that time we will plan to debride his toe and coil embolize the final bypass fistula.   SUBJECTIVE: Continues to do well.  He is exercising.  His right great toe appears about the same to me, but may be a little better to him.  His calf wounds are greatly improved.  OBJECTIVE: BP 109/75 (BP Location: Left Arm, Patient Position: Sitting, Cuff Size: Normal)   Pulse 87   Temp 98.4 F (36.9 C)   Resp 20   Ht 6' (1.829 m)   Wt 166 lb (75.3 kg)   SpO2 96%   BMI 22.51 kg/m   Edema resolved with compression dressing. Right calf wound greatly improved Right great toe tip pictured below     Right foot warm and well perfused  CBC Latest Ref Rng & Units 11/09/2020 11/01/2020 10/16/2020  WBC 4.0 - 10.5 K/uL 7.6 - 12.6(H)  Hemoglobin 13.0 - 17.0 g/dL 12.7(L) 13.3 10.8(L)  Hematocrit 39.0 - 52.0 % 39.4 39.0 32.1(L)  Platelets 150 - 400 K/uL 253 - 197     CMP Latest Ref Rng & Units 11/09/2020 11/01/2020 10/14/2020  Glucose 70 - 99 mg/dL 86 100(H) 121(H)  BUN 8 - 23 mg/dL 19 22 15   Creatinine 0.61 - 1.24 mg/dL  0.86 0.80 0.85  Sodium 135 - 145 mmol/L 140 139 136  Potassium 3.5 - 5.1 mmol/L 4.1 5.4(H) 4.2  Chloride 98 - 111 mmol/L 103 102 103  CO2 22 - 32 mmol/L 28 - 28  Calcium 8.9 - 10.3 mg/dL 8.6(L) - 7.8(L)  Total Protein 6.5 - 8.1 g/dL 6.5 - -  Total Bilirubin 0.3 - 1.2 mg/dL 0.4 - -  Alkaline Phos 38 - 126 U/L 102 - -  AST 15 - 41 U/L 29 - -  ALT 0 - 44 U/L 55(H) - [name]. Stanford Breed, MD [facility] Phone Number: [phone] 12/31/2020 10:48 AM

## 2021-01-01 ENCOUNTER — Other Ambulatory Visit: Payer: Self-pay

## 2021-01-01 ENCOUNTER — Encounter (HOSPITAL_BASED_OUTPATIENT_CLINIC_OR_DEPARTMENT_OTHER): Payer: PPO | Admitting: Internal Medicine

## 2021-01-01 ENCOUNTER — Telehealth: Payer: Self-pay

## 2021-01-01 DIAGNOSIS — L97519 Non-pressure chronic ulcer of other part of right foot with unspecified severity: Secondary | ICD-10-CM | POA: Diagnosis not present

## 2021-01-01 DIAGNOSIS — E782 Mixed hyperlipidemia: Secondary | ICD-10-CM

## 2021-01-01 DIAGNOSIS — L97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 with unspecified severity: Secondary | ICD-10-CM

## 2021-01-01 NOTE — Progress Notes (Signed)
LIONAL, ICENOGLE ([id number]) Visit Report for 01/01/2021 Chief Complaint Document Details Patient Name: Date of Service: [name], [name] 01/01/2021 8:30 A M Medical Record Number: [id number] Patient Account Number: [id number] Date of Birth/Sex: Treating RN: [birth year] (70 y.o. [name] Primary Care Provider: [name] Other Clinician: Referring Provider: Treating Provider/Extender: Bartholome Bill in Treatment: 3 Information Obtained from: Patient Chief Complaint Surgical wound dehiscence of right lower extremity and Great toe wound. Electronic Signature(s) Signed: 01/01/2021 12:36:05 PM By: Kalman Shan DO Entered By: Kalman Shan on 01/01/2021 12:31:02 -------------------------------------------------------------------------------- Debridement Details Patient Name: Date of Service: [name] 01/01/2021 8:30 A M Medical Record Number: [id number] Patient Account Number: [id number] Date of Birth/Sex: Treating RN: August 18, 1950 (70 y.o. [name] Primary Care Provider: [name] Other Clinician: Referring Provider: Treating Provider/Extender: Bartholome Bill in Treatment: 3 Debridement Performed for Assessment: Wound #1 Right,Medial Lower Leg Performed By: Physician Kalman Shan, DO Debridement Type: Debridement Level of Consciousness (Pre-procedure): Awake and Alert Pre-procedure Verification/Time Out Yes - 09:09 Taken: Start Time: 09:10 Pain Control: Lidocaine 5% topical ointment T Area Debrided (L x W): otal 2.6 (cm) x 0.4 (cm) = 1.04 (cm) Tissue and other material debrided: Non-Viable, Subcutaneous Level: Skin/Subcutaneous Tissue Debridement Description: Excisional Instrument: Curette Bleeding: Minimum Hemostasis Achieved: Pressure End Time: 09:12 Response to Treatment: Procedure was tolerated well Level of Consciousness (Post- Awake and Alert procedure): Post Debridement Measurements  of Total Wound Length: (cm) 2.6 Width: (cm) 0.4 Depth: (cm) 0.2 Volume: (cm) 0.163 Character of Wound/Ulcer Post Debridement: Stable Post Procedure Diagnosis Same as Pre-procedure Electronic Signature(s) Signed: 01/01/2021 12:36:05 PM By: Kalman Shan DO Signed: 01/01/2021 5:08:20 PM By: [name] Entered By: [name] on 01/01/2021 09:14:09 -------------------------------------------------------------------------------- Debridement Details Patient Name: Date of Service: [name] 01/01/2021 8:30 A M Medical Record Number: [id number] Patient Account Number: [id number] Date of Birth/Sex: Treating RN: July 01, 1951 (70 y.o. [name] Primary Care Provider: [name] Other Clinician: Referring Provider: Treating Provider/Extender: Bartholome Bill in Treatment: 3 Debridement Performed for Assessment: Wound #3 Right,Distal,Medial Lower Leg Performed By: Physician Kalman Shan, DO Debridement Type: Debridement Severity of Tissue Pre Debridement: Fat layer exposed Level of Consciousness (Pre-procedure): Awake and Alert Pre-procedure Verification/Time Out Yes - 09:09 Taken: Start Time: 09:12 Pain Control: Lidocaine 5% topical ointment T Area Debrided (L x W): otal 0.1 (cm) x 0.1 (cm) = 0.01 (cm) Tissue and other material debrided: Non-Viable, Subcutaneous Level: Skin/Subcutaneous Tissue Debridement Description: Excisional Instrument: Curette Bleeding: Minimum Hemostasis Achieved: Pressure End Time: 09:14 Response to Treatment: Procedure was tolerated well Level of Consciousness (Post- Awake and Alert procedure): Post Debridement Measurements of Total Wound Length: (cm) 0.4 Width: (cm) 0.2 Depth: (cm) 0.2 Volume: (cm) 0.013 Character of Wound/Ulcer Post Debridement: Stable Severity of Tissue Post Debridement: Fat layer exposed Post Procedure Diagnosis Same as Pre-procedure Electronic Signature(s) Signed: 01/01/2021  9:24:50 AM By: [name] Signed: 01/01/2021 12:36:05 PM By: Kalman Shan DO Entered By: [name] on 01/01/2021 09:24:50 -------------------------------------------------------------------------------- HPI Details Patient Name: Date of Service: [name] 01/01/2021 8:30 A M Medical Record Number: [id number] Patient Account Number: [id number] Date of Birth/Sex: Treating RN: 1951-04-20 (70 y.o. [name] Primary Care Provider: [name] Other Clinician: Referring Provider: Treating Provider/Extender: Bartholome Bill in Treatment: 3 History of Present Illness HPI Description: Admission 6/2 Mr. Carmine Carrozza is a 70 year old male with a past medical history of peripheral arterial disease status post right  common femoral to peroneal bypass on 10/13/2020 and coil embolization of bypass fistula x2 on 11/01/2020. He presents today because he has wound dehiscence that occurred 3 weeks ago from his recent vascular surgery. He has been using wet-to-dry dressings to this area. He also has necrotic tissue to the right great toe. This was the original symptom that precipitated further vascular evaluation that led to his aortogram and ultimately bypass graft. He is currently keeping this area covered. He currently denies signs of infection. 6/6; patient presents for 1 week follow-up. He had Kerlix/Coban compression for the past week with Hydrofera Blue underneath. He reports tolerating this well without any issues. He thinks the wound looks smaller today. He has been using Betadine to the toe wound. He has no complaints today. He denies signs of infection. 6/20; patient presents for 2-week follow-up. He had Kerlix/Coban compression with Hydrofera Blue. He has tolerated this well. He reports significant improvement in leg swelling. He also thinks the wound looks smaller. He has no complaints today and denies signs of infection. 6/27; patient presents for  1 week follow-up. He continues to use Hydrofera Blue under Kerlix/Coban compression and has tolerated this well. He continues to report improvement in leg swelling. He is content with the wound care progress at this time. He denies signs of infection. Electronic Signature(s) Signed: 01/01/2021 12:36:05 PM By: Kalman Shan DO Entered By: Kalman Shan on 01/01/2021 12:31:38 -------------------------------------------------------------------------------- Physical Exam Details Patient Name: Date of Service: KENIEL, RALSTON 01/01/2021 8:30 A M Medical Record Number: [id number] Patient Account Number: [id number] Date of Birth/Sex: Treating RN: 1950-09-20 (70 y.o. [name] Primary Care Provider: [name] Other Clinician: Referring Provider: Treating Provider/Extender: Vivia Budge Weeks in Treatment: 3 Constitutional respirations regular, non-labored and within target range for patient.Marland Kitchen Psychiatric pleasant and cooperative. Notes Right lower extremity: 2+ pitting edema to the knee. Open wound to the medial aspect with nonviable tissue and granulation tissue present. No signs of infection. T the most distal part there was a scab with some serous drainage noted. After I removed the scab there is serosanguineous drainage but no o purulent drainage. There is some depth to this. Overall no signs of infection to this area Right foot: The right great toe has an eschar present. No obvious signs of infection. Electronic Signature(s) Signed: 01/01/2021 12:36:05 PM By: Kalman Shan DO Entered By: Kalman Shan on 01/01/2021 12:32:40 -------------------------------------------------------------------------------- Physician Orders Details Patient Name: Date of Service: [name] 01/01/2021 8:30 A M Medical Record Number: [id number] Patient Account Number: [id number] Date of Birth/Sex: Treating RN: 12/15/1950 (70 y.o. 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 - with Dr. Heber [place] Return A Other: - May need to come tomorrow for re-wrap after vascular appt Bathing/ Shower/ Hygiene May shower with protection but do not get wound dressing(s) wet. Edema Control - Lymphedema / SCD / Other Elevate legs to the level of the heart or above for 30 minutes daily and/or when sitting, a frequency of: - throughout the day Avoid standing for long periods of time. Exercise regularly Additional Orders /  Instructions Follow Nutritious Diet Wound Treatment Wound #1 - Lower Leg Wound Laterality: Right, Medial Cleanser: Soap and Water 1 x Per Week/7 Days Discharge Instructions: May shower and wash wound with dial antibacterial soap and water prior to dressing change. Cleanser: Wound Cleanser (Generic) 1 x Per Week/7 Days Discharge Instructions: Cleanse the wound with wound cleanser prior to applying a clean dressing using gauze sponges, not tissue or cotton balls. Peri-Wound Care: Sween Lotion (Moisturizing lotion) 1 x Per Week/7 Days Discharge Instructions: Apply moisturizing lotion as directed Prim Dressing: Hydrofera Blue Classic Foam, 4x4 in 1 x Per Week/7 Days ary Discharge Instructions: Moisten with saline prior to applying to wound bed. Apply over Santyl. Prim Dressing: Santyl Ointment 1 x Per Week/7 Days ary Discharge Instructions: Apply nickel thick amount to wound bed as instructed Secondary  Dressing: Woven Gauze Sponge, Non-Sterile 4x4 in (Generic) 1 x Per Week/7 Days Discharge Instructions: Apply over primary dressing as directed. Secondary Dressing: ABD Pad, 5x9 1 x Per Week/7 Days Discharge Instructions: Apply over primary dressing as directed. Compression Wrap: Kerlix Roll 4.5x3.1 (in/yd) (Generic) 1 x Per Week/7 Days Discharge Instructions: Apply Kerlix and Coban compression as directed. Compression Wrap: Coban Self-Adherent Wrap 4x5 (in/yd) (Generic) 1 x Per Week/7 Days Discharge Instructions: Apply over Kerlix as directed. Wound #2 - T Great oe Wound Laterality: Right Cleanser: Soap and Water 1 x Per Day/7 Days Discharge Instructions: May shower and wash wound with dial antibacterial soap and water prior to dressing change. Prim Dressing: betadine 1 x Per Day/7 Days ary Discharge Instructions: apply daily Secondary Dressing: Woven Gauze Sponge, Non-Sterile 4x4 in 1 x Per Day/7 Days Discharge Instructions: Apply over primary dressing as directed. Secured With: 65M Medipore H Soft Cloth Surgical T 4 x 2 (in/yd) 1 x Per Day/7 Days ape Discharge Instructions: Secure dressing with tape as directed. Wound #3 - Lower Leg Wound Laterality: Right, Medial, Distal Cleanser: Soap and Water 1 x Per Week/7 Days Discharge Instructions: May shower and wash wound with dial antibacterial soap and water prior to dressing change. Cleanser: Wound Cleanser (Generic) 1 x Per Week/7 Days Discharge Instructions: Cleanse the wound with wound cleanser prior to applying a clean dressing using gauze sponges, not tissue or cotton balls. Peri-Wound Care: Sween Lotion (Moisturizing lotion) 1 x Per Week/7 Days Discharge Instructions: Apply moisturizing lotion as directed Prim Dressing: KerraCel Ag Gelling Fiber Dressing, 2x2 in (silver alginate) 1 x Per Week/7 Days ary Discharge Instructions: Apply silver alginate to wound bed as instructed Secondary Dressing: Woven Gauze Sponge, Non-Sterile 4x4 in  (Generic) 1 x Per Week/7 Days Discharge Instructions: Apply over primary dressing as directed. Compression Wrap: Kerlix Roll 4.5x3.1 (in/yd) (Generic) 1 x Per Week/7 Days Discharge Instructions: Apply Kerlix and Coban compression as directed. Compression Wrap: Coban Self-Adherent Wrap 4x5 (in/yd) (Generic) 1 x Per Week/7 Days Discharge Instructions: Apply over Kerlix as directed. Electronic Signature(s) Signed: 01/01/2021 12:36:05 PM By: Kalman Shan DO Entered By: Kalman Shan on 01/01/2021 12:33:03 -------------------------------------------------------------------------------- Problem List Details Patient Name: Date of Service: [name] 01/01/2021 8:30 A M Medical Record Number: [id number] Patient Account Number: [id number] Date of Birth/Sex: Treating RN: Mar 04, 1951 (70 y.o. 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 01/01/2021 8:30 A M Medical Record Number:  [id number] Patient Account Number: [id number] Date of Birth/Sex: Treating RN: 05-Dec-1950 (70 y.o. [name] Primary Care Provider: [name] Other Clinician: Referring Provider: Treating Provider/Extender: Bartholome Bill in Treatment: 3 Subjective Chief Complaint Information obtained from Patient Surgical wound dehiscence of right lower extremity and Great toe wound. History of Present Illness (HPI) Admission 6/2 Mr. Jvon Meroney is a 70 year old male with a past medical history of peripheral arterial disease status post right common femoral to peroneal bypass on 10/13/2020 and coil embolization of bypass fistula x2 on 11/01/2020. He presents today because he has wound dehiscence that occurred 3 weeks ago from his recent vascular surgery. He has been using wet-to-dry dressings to this area. He also has necrotic tissue to the right great toe. This was the original symptom that precipitated further vascular evaluation that led to his aortogram and ultimately bypass graft. He is currently keeping this area covered. He currently denies signs of infection. 6/6; patient presents for 1 week follow-up. He had Kerlix/Coban compression for the past week with Hydrofera Blue underneath. He reports tolerating this well without any issues. He thinks the wound looks smaller today. He has been using Betadine to the toe wound. He has no complaints today. He denies signs of infection. 6/20; patient presents for 2-week follow-up. He had Kerlix/Coban compression with Hydrofera Blue. He has tolerated this well. He reports significant improvement in leg swelling. He also thinks the wound looks smaller. He has no complaints today and denies signs of infection. 6/27; patient presents for 1 week follow-up. He continues to use Hydrofera Blue under Kerlix/Coban compression and has tolerated this well. He continues to report improvement in leg swelling. He is content with the wound care  progress at this time. He denies signs of infection. Patient History Information obtained from Patient. Family History Unknown History. Social History Never smoker, Marital Status - Married, Alcohol Use - Moderate, Drug Use - No History, Caffeine Use - Daily. Medical History Cardiovascular Patient has history of Coronary Artery Disease, Peripheral Arterial Disease, Peripheral Venous Disease Objective Constitutional respirations regular, non-labored and within target range for patient.. Vitals Time Taken: 8:35 AM, Height: 72 in, Weight: 162 lbs, BMI: 22, Temperature: 97.5 F, Pulse: 71 bpm, Respiratory Rate: 16 breaths/min, Blood Pressure: 150/85 mmHg. Psychiatric pleasant and cooperative. General Notes: Right lower extremity: 2+ pitting edema to the knee. Open wound to the medial aspect with nonviable tissue and granulation tissue present. No signs of infection. T the most distal part there was a scab with some serous drainage noted. After I removed the scab there is serosanguineous drainage but o no purulent drainage. There is some depth to this. Overall no signs of infection to this area Right foot: The right great toe has an eschar present. No obvious signs of infection. Integumentary (Hair, Skin) Wound #1 status is Open. Original cause of wound was Surgical Injury. The date acquired was: 11/16/2020. The wound has been in treatment 3 weeks. The wound is located on the Right,Medial Lower Leg.  The wound measures 2.6cm length x 0.4cm width x 0.2cm depth; 0.817cm^2 area and 0.163cm^3 volume. There is Fat Layer (Subcutaneous Tissue) exposed. There is no tunneling or undermining noted. There is a medium amount of serosanguineous drainage noted. The wound margin is well defined and not attached to the wound base. There is large (67-100%) red granulation within the wound bed. There is a small (1-33%) amount of necrotic tissue within the wound bed including Adherent Slough. Wound #2 status is  Open. Original cause of wound was Gradually Appeared. The date acquired was: 09/18/2020. The wound has been in treatment 3 weeks. The wound is located on the Right T Great. The wound measures 1.8cm length x 1.3cm width x 0.1cm depth; 1.838cm^2 area and 0.184cm^3 volume. There is Fat oe Layer (Subcutaneous Tissue) exposed. There is no tunneling or undermining noted. There is a small amount of serosanguineous drainage noted. The wound margin is distinct with the outline attached to the wound base. There is no granulation within the wound bed. There is a large (67-100%) amount of necrotic tissue within the wound bed including Eschar. Wound #3 status is Open. Original cause of wound was Gradually Appeared. The date acquired was: 12/11/2020. The wound has been in treatment 3 weeks. The wound is located on the Right,Distal,Medial Lower Leg. The wound measures 0.4cm length x 0.2cm width x 0.2cm depth; 0.063cm^2 area and 0.013cm^3 volume. There is Fat Layer (Subcutaneous Tissue) exposed. There is no tunneling or undermining noted. There is a medium amount of serosanguineous drainage noted. The wound margin is distinct with the outline attached to the wound base. There is large (67-100%) red, pink granulation within the wound bed. There is no necrotic tissue within the wound bed. 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 The nonviable tissue was debrided in office. I recommended continuing Santyl with Hydrofera Blue under Kerlix/Coban. The most distal tip had some serosanguineous drainage and depth to it. I recommended putting some silver alginate in this area prior  to the compression wrap. T the right first toe he can continue Betadine. Overall no signs of infection. I will see him in 1 o week Procedures Wound #1 Pre-procedure diagnosis of Wound #1 is a Dehisced Wound located on the Right,Medial Lower Leg . There was a Excisional Skin/Subcutaneous Tissue Debridement with a total area of 1.04 sq cm performed by Kalman Shan, DO. With the following instrument(s): Curette to remove Non-Viable tissue/material. Material removed includes Subcutaneous Tissue after achieving pain control using Lidocaine 5% topical ointment. No specimens were taken. A time out was conducted at 09:09, prior to the start of the procedure. A Minimum amount of bleeding was controlled with Pressure. The procedure was tolerated well. Post Debridement Measurements: 2.6cm length x 0.4cm width x 0.2cm depth; 0.163cm^3 volume. Character of Wound/Ulcer Post Debridement is stable. Post procedure Diagnosis Wound #1: Same as Pre-Procedure Wound #3 Pre-procedure diagnosis of Wound #3 is a Venous Leg Ulcer located on the Right,Distal,Medial Lower Leg .Severity of Tissue Pre Debridement is: Fat layer exposed. There was a Excisional Skin/Subcutaneous Tissue Debridement with a total area of 0.01 sq cm performed by Kalman Shan, DO. With the following instrument(s): Curette to remove Non-Viable tissue/material. Material removed includes Subcutaneous Tissue after achieving pain  control using Lidocaine 5% topical ointment. No specimens were taken. A time out was conducted at 09:09, prior to the start of the procedure. A Minimum amount of bleeding was controlled with Pressure. The procedure was tolerated well. Post Debridement Measurements: 0.4cm length x 0.2cm width x 0.2cm depth; 0.013cm^3 volume. Character of Wound/Ulcer Post Debridement is stable. Severity of Tissue Post Debridement is: Fat layer exposed. Post procedure Diagnosis Wound #3: Same as Pre-Procedure Plan Follow-up  Appointments: Return Appointment in 1 week. - with Dr. Heber [place] Other: - May need to come tomorrow for re-wrap after vascular appt Bathing/ Shower/ Hygiene: May shower with protection but do not get wound dressing(s) wet. Edema Control - Lymphedema / SCD / Other: Elevate legs to the level of the heart or above for 30 minutes daily and/or when sitting, a frequency of: - throughout the day Avoid standing for long periods of time. Exercise regularly Additional Orders / Instructions: Follow Nutritious Diet WOUND #1: - Lower Leg Wound Laterality: Right, Medial Cleanser: Soap and Water 1 x Per Week/7 Days Discharge Instructions: May shower and wash wound with dial antibacterial soap and water prior to dressing change. Cleanser: Wound Cleanser (Generic) 1 x Per Week/7 Days Discharge Instructions: Cleanse the wound with wound cleanser prior to applying a clean dressing using gauze sponges, not tissue or cotton balls. Peri-Wound Care: Sween Lotion (Moisturizing lotion) 1 x Per Week/7 Days Discharge Instructions: Apply moisturizing lotion as directed Prim Dressing: Hydrofera Blue Classic Foam, 4x4 in 1 x Per Week/7 Days ary Discharge Instructions: Moisten with saline prior to applying to wound bed. Apply over Santyl. Prim Dressing: Santyl Ointment 1 x Per Week/7 Days ary Discharge Instructions: Apply nickel thick amount to wound bed as instructed Secondary Dressing: Woven Gauze Sponge, Non-Sterile 4x4 in (Generic) 1 x Per Week/7 Days Discharge Instructions: Apply over primary dressing as directed. Secondary Dressing: ABD Pad, 5x9 1 x Per Week/7 Days Discharge Instructions: Apply over primary dressing as directed. Com pression Wrap: Kerlix Roll 4.5x3.1 (in/yd) (Generic) 1 x Per Week/7 Days Discharge Instructions: Apply Kerlix and Coban compression as directed. Com pression Wrap: Coban Self-Adherent Wrap 4x5 (in/yd) (Generic) 1 x Per Week/7 Days Discharge Instructions: Apply over Kerlix as  directed. WOUND #2: - T Great Wound Laterality: Right oe Cleanser: Soap and Water 1 x Per Day/7 Days Discharge Instructions: May shower and wash wound with dial antibacterial soap and water prior to dressing change. Prim Dressing: betadine 1 x Per Day/7 Days ary Discharge Instructions: apply daily Secondary Dressing: Woven Gauze Sponge, Non-Sterile 4x4 in 1 x Per Day/7 Days Discharge Instructions: Apply over primary dressing as directed. Secured With: 43M Medipore H Soft Cloth Surgical T 4 x 2 (in/yd) 1 x Per Day/7 Days ape Discharge Instructions: Secure dressing with tape as directed. WOUND #3: - Lower Leg Wound Laterality: Right, Medial, Distal Cleanser: Soap and Water 1 x Per Week/7 Days Discharge Instructions: May shower and wash wound with dial antibacterial soap and water prior to dressing change. Cleanser: Wound Cleanser (Generic) 1 x Per Week/7 Days Discharge Instructions: Cleanse the wound with wound cleanser prior to applying a clean dressing using gauze sponges, not tissue or cotton balls. Peri-Wound Care: Sween Lotion (Moisturizing lotion) 1 x Per Week/7 Days Discharge Instructions: Apply moisturizing lotion as directed Prim Dressing: KerraCel Ag Gelling Fiber Dressing, 2x2 in (silver alginate) 1 x Per Week/7 Days ary Discharge Instructions: Apply silver alginate to wound bed as instructed Secondary Dressing: Woven Gauze Sponge, Non-Sterile 4x4 in (Generic) 1 x Per Week/7  Days Discharge Instructions: Apply over primary dressing as directed. Com pression Wrap: Kerlix Roll 4.5x3.1 (in/yd) (Generic) 1 x Per Week/7 Days Discharge Instructions: Apply Kerlix and Coban compression as directed. Com pression Wrap: Coban Self-Adherent Wrap 4x5 (in/yd) (Generic) 1 x Per Week/7 Days Discharge Instructions: Apply over Kerlix as directed. 1. In office sharp debridement 2. Continue Hydrofera Blue and Santyl to the medial wound 3. T the distal wound silver alginate o 4. Kerlix/Coban 5.  Follow-up in 1 week Electronic Signature(s) Signed: 01/01/2021 12:36:05 PM By: Kalman Shan DO Entered By: Kalman Shan on 01/01/2021 12:35:23 -------------------------------------------------------------------------------- HxROS Details Patient Name: Date of Service: [name] 01/01/2021 8:30 A M Medical Record Number: [id number] Patient Account Number: [id number] Date of Birth/Sex: Treating RN: Jul 24, 1950 (70 y.o. [name] Primary Care Provider: [name] Other Clinician: Referring Provider: Treating Provider/Extender: Bartholome Bill in Treatment: 3 Information Obtained From Patient Cardiovascular Medical History: Positive for: Coronary Artery Disease; Peripheral Arterial Disease; Peripheral Venous Disease Immunizations Pneumococcal Vaccine: Received Pneumococcal Vaccination: Yes Implantable Devices None Family and Social History Unknown History: Yes; Never smoker; Marital Status - Married; Alcohol Use: Moderate; Drug Use: No History; Caffeine Use: Daily; Financial Concerns: No; Food, Clothing or Shelter Needs: No; Support System Lacking: No; Transportation Concerns: No Electronic Signature(s) Signed: 01/01/2021 12:36:05 PM By: Kalman Shan DO Signed: 01/01/2021 5:08:20 PM By: [name] Entered By: Kalman Shan on 01/01/2021 12:31:44 -------------------------------------------------------------------------------- [place] Details Patient Name: Date of Service: [name] 01/01/2021 Medical Record Number: [id number] Patient Account Number: [id number] Date of Birth/Sex: Treating RN: September 18, 1950 (70 y.o. 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 I73.9 Peripheral vascular disease, unspecified Facility Procedures CPT4 Code: [id number] Description: 60630 - DEB SUBQ TISSUE 20 SQ CM/< ICD-10 Diagnosis Description 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 with unspecified se Modifier: verity Quantity: 1 Physician Procedures : CPT4 Code Description Modifier [id number] 11042 - WC PHYS SUBQ TISS 20 SQ CM ICD-10 Diagnosis Description 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

## 2021-01-01 NOTE — Telephone Encounter (Signed)
Lmom for pt to complete fasting lipids and hepatic

## 2021-01-01 NOTE — Telephone Encounter (Signed)
-----   Message from Harrington Challenger, [place] sent at 11/21/2020 11:23 AM EDT ----- Regarding: Repeat Lipids panel Please call patient for reminder and place order in epic.

## 2021-01-02 ENCOUNTER — Encounter: Payer: Self-pay | Admitting: Vascular Surgery

## 2021-01-02 ENCOUNTER — Other Ambulatory Visit: Payer: Self-pay

## 2021-01-02 ENCOUNTER — Telehealth: Payer: Self-pay

## 2021-01-02 ENCOUNTER — Ambulatory Visit: Payer: Self-pay | Admitting: Vascular Surgery

## 2021-01-02 VITALS — BP 109/75 | HR 87 | Temp 98.4°F | Resp 20 | Ht 72.0 in | Wt 166.0 lb

## 2021-01-02 DIAGNOSIS — E782 Mixed hyperlipidemia: Secondary | ICD-10-CM | POA: Diagnosis not present

## 2021-01-02 DIAGNOSIS — I739 Peripheral vascular disease, unspecified: Secondary | ICD-10-CM

## 2021-01-02 LAB — HEPATIC FUNCTION PANEL
ALT: 52 IU/L — ABNORMAL HIGH (ref 0–44)
AST: 37 IU/L (ref 0–40)
Albumin: 3.6 g/dL — ABNORMAL LOW (ref 3.7–4.7)
Alkaline Phosphatase: 114 IU/L (ref 44–121)
Bilirubin Total: 0.7 mg/dL (ref 0.0–1.2)
Bilirubin, Direct: 0.18 mg/dL (ref 0.00–0.40)
Total Protein: 6.7 g/dL (ref 6.0–8.5)

## 2021-01-02 LAB — LIPID PANEL
Chol/HDL Ratio: 2.5 ratio (ref 0.0–5.0)
Cholesterol, Total: 147 mg/dL (ref 100–199)
HDL: 58 mg/dL (ref >39)
LDL Chol Calc (NIH): 70 mg/dL (ref 0–99)
Triglycerides: 106 mg/dL (ref 0–149)
VLDL Cholesterol Cal: 19 mg/dL (ref 5–40)

## 2021-01-02 NOTE — Progress Notes (Signed)
Dear Mr and Mrs. Sabra Heck,   Thank you for your excellent questions.   1) signs and symptoms of bypass failure include difficulty with walking (like before the bypass), worsening of the wound, constant pain in the foot.  You should call the office if you experience any of the symptoms.  2) while you are still healing, I need to see you every month.  We will see again in a month to assess the toe.  If the toe is not any better, or worse we will need to talk about debriding the foot and performing the last vascular intervention.  Once you are healed, we will need to perform periodic surveillance of the bypass.  This is frequent in the short-term and then becomes less.  Typically I see people at 3 months, 6 months, 6 months, and yearly thereafter.  3) continue to do cardiovascular exercise (light activity to get heart rate between 120 and 150 bpm).  I would avoid heavy lifting with the legs.  It is okay to do weight training with the upper extremities.  Once you are healed you will have no limitations.  4) we will plan to continue on aspirin, Xarelto, and high intensity statin while you are healing.  Once you are healed we can stop the Xarelto.  Aspirin and statin therapy are designed to lower your risk of heart attack and stroke and should be continued indefinitely.  All my best, Marjean Donna

## 2021-01-02 NOTE — Telephone Encounter (Signed)
Lmom pt to callback for results

## 2021-01-02 NOTE — Progress Notes (Signed)
01/02/21 Dear Mr. and Mrs. Sabra Heck,  Thank you for your excellent questions.  1) signs and symptoms to be worried for after bypass surgery include pain, or difficulty walking like you had before the surgery.  A worsening wound is also signed bypass is not working.  Fortunately we see none of these today.  2) while you are healing I will need to see him monthly.  We will plan to see you again in 1 month.  Typically I follow bypasses closely with serial evaluations and ultrasounds in the short-term.  I typically see people at 3 months, 6 months, another 6 months, and then yearly thereafter.  3) it is okay for you to continue cardiovascular exercise where you do light exercise to get your heart rate to 120-150 bpm.  I would avoid staying with your legs until you are healed.  It is okay to do heavy lifting with your arms.  No restrictions once you are healed.  4) we will need to continue the aspirin, Xarelto, and statin therapy until you are healed.  Once you are healed we can stop the Xarelto therapy.  We should continue the aspirin and statin therapy indefinitely to lower your risk of heart attack and stroke.  Please do not hesitate to call for any questions or concerns.   All my best,   Marjean Donna

## 2021-01-02 NOTE — Progress Notes (Signed)
RYLE, Jackson ([id number]) Visit Report for 01/01/2021 Arrival Information Details Patient Name: Date of Service: [name], [name] 01/01/2021 8:30 A M Medical Record Number: [id number] Patient Account Number: [id number] Date of Birth/Sex: Treating RN: 25-Oct-1950 (70 y.o. [name] Primary Care Sonya Gunnoe: [name] Other Clinician: Referring Subrena Devereux: Treating Caylan Chenard/Extender: Bartholome Bill in Treatment: 3 Visit Information History Since Last Visit Added or deleted any medications: No Patient Arrived: Ambulatory Any new allergies or adverse reactions: No Arrival Time: 08:35 Had a fall or experienced change in No Accompanied By: self activities of daily living that may affect Transfer Assistance: None risk of falls: Patient Identification Verified: Yes Signs or symptoms of abuse/neglect since last visito No Secondary Verification Process Completed: Yes Hospitalized since last visit: No Patient Requires Transmission-Based Precautions: No Implantable device outside of the clinic excluding No Patient Has Alerts: Yes cellular tissue based products placed in the center Patient Alerts: Patient on Blood Thinner since last visit: ABI R=0.95 Has Dressing in Place as Prescribed: Yes ABI L=1.21 Pain Present Now: No Electronic Signature(s) Signed: 01/01/2021 4:13:19 PM By: Sandre Kitty Entered By: Sandre Kitty on 01/01/2021 08:35:40 -------------------------------------------------------------------------------- Encounter Discharge Information Details Patient Name: Date of Service: [name]. 01/01/2021 8:30 A M Medical Record Number: [id number] Patient Account Number: [id number] Date of Birth/Sex: Treating RN: 06/17/1951 (70 y.o. [name] Primary Care Rowdy Guerrini: [name] Other Clinician: Referring Breena Bevacqua: Treating Jabrea Kallstrom/Extender: Bartholome Bill in Treatment: 3 Encounter Discharge Information  Items Post Procedure Vitals Discharge Condition: Stable Temperature (F): 97.5 Ambulatory Status: Ambulatory Pulse (bpm): 71 Discharge Destination: Home Respiratory Rate (breaths/min): 16 Transportation: Private Auto Blood Pressure (mmHg): 150/85 Accompanied By: alone Schedule Follow-up Appointment: Yes Clinical Summary of Care: Patient Declined Electronic Signature(s) Signed: 01/02/2021 5:39:04 PM By: Levan Hurst RN, BSN Entered By: Levan Hurst on 01/01/2021 12:02:21 -------------------------------------------------------------------------------- Multi Wound Chart Details Patient Name: Date of Service: [name]. 01/01/2021 8:30 A M Medical Record Number: [id number] Patient Account Number: [id number] Date of Birth/Sex: Treating RN: 06-10-1951 (70 y.o. [name] Primary Care Destin Kittler: [name] Other Clinician: Referring Loretha Ure: Treating Jamani Bearce/Extender: Bartholome Bill in Treatment: 3 Vital Signs Height(in): 72 Pulse(bpm): 75 Weight(lbs): 162 Blood Pressure(mmHg): 150/85 Body Mass Index(BMI): 22 Temperature(F): 97.5 Respiratory Rate(breaths/min): 16 Photos: [1:No Photos Right, Medial Lower Leg] [2:No Photos Right T Great oe] [3:No Photos Right, Distal, Medial Lower Leg] Wound Location: [1:Surgical Injury] [2:Gradually Appeared] [3:Gradually Appeared] Wounding Event: [1:Dehisced Wound] [2:Arterial Insufficiency Ulcer] [3:Venous Leg Ulcer] Primary Etiology: [1:Coronary Artery Disease, Peripheral Coronary Artery Disease, Peripheral Coronary Artery Disease, Peripheral] Comorbid History: [1:Arterial Disease, Peripheral Venous Arterial Disease, Peripheral Venous Arterial Disease, Peripheral Venous Disease 11/16/2020] [2:Disease 09/18/2020] [3:Disease 12/11/2020] Date Acquired: [1:3] [2:3] [3:3] Weeks of Treatment: [1:Open] [2:Open] [3:Open] Wound Status: [1:2.6x0.4x0.2] [2:1.8x1.3x0.1] [3:0.4x0.2x0.2] Measurements L x W x D (cm)  [1:0.817] [2:1.838] [3:0.063] A (cm) : rea [1:0.163] [2:0.184] [3:0.013] Volume (cm) : [1:85.10%] [2:-5.90%] [3:61.80%] % Reduction in A [1:rea: 92.60%] [2:64.70%] [3:60.60%] % Reduction in Volume: [1:Full Thickness Without Exposed] [2:Full Thickness Without Exposed] [3:Full Thickness Without Exposed] Classification: [1:Support Structures Medium] [2:Support Structures Small] [3:Support Structures Medium] Exudate A mount: [1:Serosanguineous] [2:Serosanguineous] [3:Serosanguineous] Exudate Type: [1:red, brown] [2:red, brown] [3:red, brown] Exudate Color: [1:Well defined, not attached] [2:Distinct, outline attached] [3:Distinct, outline attached] Wound Margin: [1:Large (67-100%)] [2:None Present (0%)] [3:Large (67-100%)] Granulation A mount: [1:Red] [2:[name]] [3:Red, Pink] Granulation Quality: [1:Small (1-33%)] [2:Large (67-100%)] [3:None Present (0%)] Necrotic A mount: [1:Adherent Slough] [2:Eschar] [3:[name]]  Necrotic Tissue: [1:Fat Layer (Subcutaneous Tissue): Yes Fat Layer (Subcutaneous Tissue): Yes Fat Layer (Subcutaneous Tissue): Yes] Exposed Structures: [1:Fascia: No Tendon: No Muscle: No Joint: No Bone: No Small (1-33%)] [2:Fascia: No Tendon: No Muscle: No Joint: No Bone: No None] [3:Fascia: No Tendon: No Muscle: No Joint: No Bone: No Medium (34-66%)] Epithelialization: [1:Debridement - Excisional] [2:[name]] [3:Debridement - Excisional] Debridement: Pre-procedure Verification/Time Out 09:09 [2:[name]] [3:09:09] Taken: [1:Lidocaine 5% topical ointment] [2:[name]] [3:Lidocaine 5% topical ointment] Pain Control: [1:Subcutaneous] [2:[name]] [3:Subcutaneous] Tissue Debrided: [1:Skin/Subcutaneous Tissue] [2:[name]] [3:Skin/Subcutaneous Tissue] Level: [1:1.04] [2:[name]] [3:0.01] Debridement A (sq cm): [1:rea Curette] [2:[name]] [3:Curette] Instrument: [1:Minimum] [2:[name]] [3:Minimum] Bleeding: [1:Pressure] [2:[name]] [3:Pressure] Hemostasis A chieved: [1:Procedure was tolerated well] [2:[name]] [3:Procedure was  tolerated well] Debridement Treatment Response: [1:2.6x0.4x0.2] [2:[name]] [3:0.4x0.2x0.2] Post Debridement Measurements L x W x D (cm) [1:0.163] [2:[name]] [3:0.013] Post Debridement Volume: (cm) [1:Debridement] [2:[name]] [3:Debridement] Treatment Notes Wound #1 (Lower Leg) Wound Laterality: Right, Medial Cleanser Soap and Water Discharge Instruction: May shower and wash wound with dial antibacterial soap and water prior to dressing change. Wound Cleanser Discharge Instruction: Cleanse the wound with wound cleanser prior to applying a clean dressing using gauze sponges, not tissue or cotton balls. Peri-Wound Care Sween Lotion (Moisturizing lotion) Discharge Instruction: Apply moisturizing lotion as directed Topical Primary Dressing Hydrofera Blue Classic Foam, 4x4 in Discharge Instruction: Moisten with saline prior to applying to wound bed. Apply over Santyl. Santyl Ointment Discharge Instruction: Apply nickel thick amount to wound bed as instructed Secondary Dressing Woven Gauze Sponge, Non-Sterile 4x4 in Discharge Instruction: Apply over primary dressing as directed. ABD Pad, 5x9 Discharge Instruction: Apply over primary dressing as directed. Secured With Compression Wrap Kerlix Roll 4.5x3.1 (in/yd) Discharge Instruction: Apply Kerlix and Coban compression as directed. Coban Self-Adherent Wrap 4x5 (in/yd) Discharge Instruction: Apply over Kerlix as directed. Compression Stockings Add-Ons Wound #2 (Toe Great) Wound Laterality: Right Cleanser Soap and Water Discharge Instruction: May shower and wash wound with dial antibacterial soap and water prior to dressing change. Peri-Wound Care Topical Primary Dressing betadine Discharge Instruction: apply daily Secondary Dressing Woven Gauze Sponge, Non-Sterile 4x4 in Discharge Instruction: Apply over primary dressing as directed. Secured With 62M Medipore H Soft Cloth Surgical T 4 x 2 (in/yd) ape Discharge Instruction: Secure  dressing with tape as directed. Compression Wrap Compression Stockings Add-Ons Wound #3 (Lower Leg) Wound Laterality: Right, Medial, Distal Cleanser Soap and Water Discharge Instruction: May shower and wash wound with dial antibacterial soap and water prior to dressing change. Wound Cleanser Discharge Instruction: Cleanse the wound with wound cleanser prior to applying a clean dressing using gauze sponges, not tissue or cotton balls. Peri-Wound Care Sween Lotion (Moisturizing lotion) Discharge Instruction: Apply moisturizing lotion as directed Topical Primary Dressing KerraCel Ag Gelling Fiber Dressing, 2x2 in (silver alginate) Discharge Instruction: Apply silver alginate to wound bed as instructed Secondary Dressing Woven Gauze Sponge, Non-Sterile 4x4 in Discharge Instruction: Apply over primary dressing as directed. Secured With Compression Wrap Kerlix Roll 4.5x3.1 (in/yd) Discharge Instruction: Apply Kerlix and Coban compression as directed. Coban Self-Adherent Wrap 4x5 (in/yd) Discharge Instruction: Apply over Kerlix as directed. Compression Stockings Add-Ons Electronic Signature(s) Signed: 01/01/2021 12:36:05 PM By: Kalman Shan DO Signed: 01/01/2021 5:08:20 PM By: [name] Entered By: Kalman Shan on 01/01/2021 12:30:51 -------------------------------------------------------------------------------- Multi-Disciplinary Care Plan Details Patient Name: Date of Service: MARIAN, GRANDT 01/01/2021 8:30 A M Medical Record Number: [id number] Patient Account Number: [id number] Date of Birth/Sex: Treating RN: [birth year] (69 y.o. [name] Primary Care Jaisa Defino: [name] Other Clinician: Referring  Maricus Tanzi: Treating Denise Bramblett/Extender: Bartholome Bill in Treatment: 3 Active Inactive Wound/Skin Impairment Nursing Diagnoses: Impaired tissue integrity Knowledge deficit related to ulceration/compromised skin  integrity Goals: Patient will have a decrease in wound volume by X% from date: (specify in notes) Date Initiated: 12/07/2020 Target Resolution Date: 01/13/2021 Goal Status: Active Patient/caregiver will verbalize understanding of skin care regimen Date Initiated: 12/07/2020 Target Resolution Date: 01/11/2021 Goal Status: Active Ulcer/skin breakdown will have a volume reduction of 30% by week 4 Date Initiated: 12/07/2020 Target Resolution Date: 01/13/2021 Goal Status: Active Interventions: Assess patient/caregiver ability to obtain necessary supplies Assess patient/caregiver ability to perform ulcer/skin care regimen upon admission and as needed Assess ulceration(s) every visit Notes: Electronic Signature(s) Signed: 01/01/2021 5:08:20 PM By: [name] Entered By: [name] on 01/01/2021 09:04:53 -------------------------------------------------------------------------------- Pain Assessment Details Patient Name: Date of Service: [name] 01/01/2021 8:30 A M Medical Record Number: [id number] Patient Account Number: [id number] Date of Birth/Sex: Treating RN: [birth year] (70 y.o. [name] Primary Care Saphyre Cillo: [name] Other Clinician: Referring Niza Soderholm: Treating Meliya Mcconahy/Extender: Bartholome Bill in Treatment: 3 Active Problems Location of Pain Severity and Description of Pain Patient Has Paino No Site Locations Pain Management and Medication Current Pain Management: Electronic Signature(s) Signed: 01/01/2021 4:13:19 PM By: Sandre Kitty Signed: 01/01/2021 5:08:20 PM By: [name] Entered By: Sandre Kitty on 01/01/2021 08:36:03 -------------------------------------------------------------------------------- Patient/Caregiver Education Details Patient Name: Date of Service: [name] 6/27/2022andnbsp8:30 [place] Record Number: [id number] Patient Account Number: [id number] Date of Birth/Gender: Treating  RN: [birth year] (70 y.o. [name] Primary Care Physician: [name] Other Clinician: Referring Physician: Treating Physician/Extender: Bartholome Bill in Treatment: 3 Education Assessment Education Provided To: Patient Education Topics Provided Venous: Methods: Explain/Verbal, Printed Responses: State content correctly Wound/Skin Impairment: Methods: Explain/Verbal, Printed Responses: State content correctly Electronic Signature(s) Signed: 01/01/2021 5:08:20 PM By: [name] Entered By: [name] on 01/01/2021 09:05:22 -------------------------------------------------------------------------------- Wound Assessment Details Patient Name: Date of Service: [name] 01/01/2021 8:30 A M Medical Record Number: [id number] Patient Account Number: [id number] Date of Birth/Sex: Treating RN: 05/18/1951 (70 y.o. [name] Primary Care Vasilia Dise: [name] Other Clinician: Referring Abagail Limb: Treating Olyver Hawes/Extender: Vivia Budge Weeks in Treatment: 3 Wound Status Wound Number: 1 Primary Dehisced Wound Etiology: Wound Location: Right, Medial Lower Leg Wound Open Wounding Event: Surgical Injury Status: Date Acquired: 11/16/2020 Comorbid Coronary Artery Disease, Peripheral Arterial Disease, Weeks Of Treatment: 3 History: Peripheral Venous Disease Clustered Wound: No Photos Wound Measurements Length: (cm) 2.6 Width: (cm) 0.4 Depth: (cm) 0.2 Area: (cm) 0.817 Volume: (cm) 0.163 % Reduction in Area: 85.1% % Reduction in Volume: 92.6% Epithelialization: Small (1-33%) Tunneling: No Undermining: No Wound Description Classification: Full Thickness Without Exposed Support Structures Wound Margin: Well defined, not attached Exudate Amount: Medium Exudate Type: Serosanguineous Exudate Color: red, brown Foul Odor After Cleansing: No Slough/Fibrino Yes Wound Bed Granulation Amount: Large  (67-100%) Exposed Structure Granulation Quality: Red Fascia Exposed: No Necrotic Amount: Small (1-33%) Fat Layer (Subcutaneous Tissue) Exposed: Yes Necrotic Quality: Adherent Slough Tendon Exposed: No Muscle Exposed: No Joint Exposed: No Bone Exposed: No Treatment Notes Wound #1 (Lower Leg) Wound Laterality: Right, Medial Cleanser Soap and Water Discharge Instruction: May shower and wash wound with dial antibacterial soap and water prior to dressing change. Wound Cleanser Discharge Instruction: Cleanse the wound with wound cleanser prior to applying a clean dressing using gauze sponges, not tissue or cotton balls. Peri-Wound Care Sween Lotion (Moisturizing  lotion) Discharge Instruction: Apply moisturizing lotion as directed Topical Primary Dressing Hydrofera Blue Classic Foam, 4x4 in Discharge Instruction: Moisten with saline prior to applying to wound bed. Apply over Santyl. Santyl Ointment Discharge Instruction: Apply nickel thick amount to wound bed as instructed Secondary Dressing Woven Gauze Sponge, Non-Sterile 4x4 in Discharge Instruction: Apply over primary dressing as directed. ABD Pad, 5x9 Discharge Instruction: Apply over primary dressing as directed. Secured With Compression Wrap Kerlix Roll 4.5x3.1 (in/yd) Discharge Instruction: Apply Kerlix and Coban compression as directed. Coban Self-Adherent Wrap 4x5 (in/yd) Discharge Instruction: Apply over Kerlix as directed. Compression Stockings Add-Ons Electronic Signature(s) Signed: 01/01/2021 4:13:19 PM By: Sandre Kitty Signed: 01/01/2021 5:08:20 PM By: [name] Entered By: Sandre Kitty on 01/01/2021 16:01:45 -------------------------------------------------------------------------------- Wound Assessment Details Patient Name: Date of Service: [name]. 01/01/2021 8:30 A M Medical Record Number: [id number] Patient Account Number: [id number] Date of Birth/Sex: Treating RN: 1950-11-24 (70 y.o. [name] Primary Care Denilson Salminen: [name] Other Clinician: Referring Zairah Arista: Treating Kaiana Marion/Extender: Vivia Budge Weeks in Treatment: 3 Wound Status Wound Number: 2 Primary Arterial Insufficiency Ulcer Etiology: Wound Location: Right T Great oe Wound Open Wounding Event: Gradually Appeared Status: Date Acquired: 09/18/2020 Comorbid Coronary Artery Disease, Peripheral Arterial Disease, Weeks Of Treatment: 3 History: Peripheral Venous Disease Clustered Wound: No Photos Wound Measurements Length: (cm) 1.8 Width: (cm) 1.3 Depth: (cm) 0.1 Area: (cm) 1.838 Volume: (cm) 0.184 % Reduction in Area: -5.9% % Reduction in Volume: 64.7% Epithelialization: None Tunneling: No Undermining: No Wound Description Classification: Full Thickness Without Exposed Support Structu Wound Margin: Distinct, outline attached Exudate Amount: Small Exudate Type: Serosanguineous Exudate Color: red, brown res Foul Odor After Cleansing: No Slough/Fibrino No Wound Bed Granulation Amount: None Present (0%) Exposed Structure Necrotic Amount: Large (67-100%) Fascia Exposed: No Necrotic Quality: Eschar Fat Layer (Subcutaneous Tissue) Exposed: Yes Tendon Exposed: No Muscle Exposed: No Joint Exposed: No Bone Exposed: No Treatment Notes Wound #2 (Toe Great) Wound Laterality: Right Cleanser Soap and Water Discharge Instruction: May shower and wash wound with dial antibacterial soap and water prior to dressing change. Peri-Wound Care Topical Primary Dressing betadine Discharge Instruction: apply daily Secondary Dressing Woven Gauze Sponge, Non-Sterile 4x4 in Discharge Instruction: Apply over primary dressing as directed. Secured With 70M Medipore H Soft Cloth Surgical T 4 x 2 (in/yd) ape Discharge Instruction: Secure dressing with tape as directed. Compression Wrap Compression Stockings Add-Ons Electronic Signature(s) Signed: 01/01/2021 4:13:19 PM By:  Sandre Kitty Signed: 01/01/2021 5:08:20 PM By: [name] Entered By: Sandre Kitty on 01/01/2021 16:02:01 -------------------------------------------------------------------------------- Wound Assessment Details Patient Name: Date of Service: [name]. 01/01/2021 8:30 A M Medical Record Number: [id number] Patient Account Number: [id number] Date of Birth/Sex: Treating RN: [birth year] (70 y.o. [name] Primary Care Natisha Trzcinski: [name] Other Clinician: Referring Magaly Pollina: Treating Beata Beason/Extender: Vivia Budge Weeks in Treatment: 3 Wound Status Wound Number: 3 Primary Venous Leg Ulcer Etiology: Wound Location: Right, Distal, Medial Lower Leg Wound Open Wounding Event: Gradually Appeared Status: Date Acquired: 12/11/2020 Comorbid Coronary Artery Disease, Peripheral Arterial Disease, Weeks Of Treatment: 3 History: Peripheral Venous Disease Clustered Wound: No Photos Wound Measurements Length: (cm) 0.4 Width: (cm) 0.2 Depth: (cm) 0.2 Area: (cm) 0.063 Volume: (cm) 0.013 % Reduction in Area: 61.8% % Reduction in Volume: 60.6% Epithelialization: Medium (34-66%) Wound Description Classification: Full Thickness Without Exposed Support Structures Wound Margin: Distinct, outline attached Exudate Amount: Medium Exudate Type: Serosanguineous Exudate Color: red, brown Foul Odor After Cleansing: No Slough/Fibrino No Wound Bed  Granulation Amount: Large (67-100%) Exposed Structure Granulation Quality: Red, Pink Fascia Exposed: No Necrotic Amount: None Present (0%) Fat Layer (Subcutaneous Tissue) Exposed: Yes Tendon Exposed: No Muscle Exposed: No Joint Exposed: No Bone Exposed: No Treatment Notes Wound #3 (Lower Leg) Wound Laterality: Right, Medial, Distal Cleanser Soap and Water Discharge Instruction: May shower and wash wound with dial antibacterial soap and water prior to dressing change. Wound Cleanser Discharge  Instruction: Cleanse the wound with wound cleanser prior to applying a clean dressing using gauze sponges, not tissue or cotton balls. Peri-Wound Care Sween Lotion (Moisturizing lotion) Discharge Instruction: Apply moisturizing lotion as directed Topical Primary Dressing KerraCel Ag Gelling Fiber Dressing, 2x2 in (silver alginate) Discharge Instruction: Apply silver alginate to wound bed as instructed Secondary Dressing Woven Gauze Sponge, Non-Sterile 4x4 in Discharge Instruction: Apply over primary dressing as directed. Secured With Compression Wrap Kerlix Roll 4.5x3.1 (in/yd) Discharge Instruction: Apply Kerlix and Coban compression as directed. Coban Self-Adherent Wrap 4x5 (in/yd) Discharge Instruction: Apply over Kerlix as directed. Compression Stockings Add-Ons Electronic Signature(s) Signed: 01/01/2021 4:13:19 PM By: Sandre Kitty Signed: 01/01/2021 5:08:20 PM By: [name] Entered By: Sandre Kitty on 01/01/2021 16:01:30 -------------------------------------------------------------------------------- Vitals Details Patient Name: Date of Service: [name]. 01/01/2021 8:30 A M Medical Record Number: [id number] Patient Account Number: [id number] Date of Birth/Sex: Treating RN: [birth year] (70 y.o. [name] Primary Care Marguerette Sheller: [name] Other Clinician: Referring Chao Blazejewski: Treating Jerimy Johanson/Extender: Bartholome Bill in Treatment: 3 Vital Signs Time Taken: 08:35 Temperature (F): 97.5 Height (in): 72 Pulse (bpm): 71 Weight (lbs): 162 Respiratory Rate (breaths/min): 16 Body Mass Index (BMI): 22 Blood Pressure (mmHg): 150/85 Reference Range: 80 - 120 mg / dl Electronic Signature(s) Signed: 01/01/2021 4:13:19 PM By: Sandre Kitty Entered By: Sandre Kitty on 01/01/2021 08:35:58

## 2021-01-03 ENCOUNTER — Encounter (HOSPITAL_BASED_OUTPATIENT_CLINIC_OR_DEPARTMENT_OTHER): Payer: PPO | Admitting: Internal Medicine

## 2021-01-03 ENCOUNTER — Other Ambulatory Visit: Payer: Self-pay

## 2021-01-03 ENCOUNTER — Telehealth: Payer: Self-pay

## 2021-01-03 DIAGNOSIS — L97519 Non-pressure chronic ulcer of other part of right foot with unspecified severity: Secondary | ICD-10-CM | POA: Diagnosis not present

## 2021-01-03 DIAGNOSIS — E782 Mixed hyperlipidemia: Secondary | ICD-10-CM

## 2021-01-03 NOTE — Telephone Encounter (Signed)
Lmom to go over results.

## 2021-01-03 NOTE — Progress Notes (Signed)
[name], [name] ([id number]) Visit Report for 01/03/2021 [employer] Details Patient Name: Date of Service: [name], [name] 01/03/2021 Medical Record Number: [id number] Patient Account Number: [id number] Date of Birth/Sex: Treating RN: [birth year] (70 y.o. 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

## 2021-01-03 NOTE — Progress Notes (Signed)
[name], [name] ([id number]) Visit Report for 01/03/2021 Arrival Information Details Patient Name: Date of Service: [name], [name] 01/03/2021 1:00 PM Medical Record Number: [id number] Patient Account Number: [id number] Date of Birth/Sex: Treating RN: Feb 08, 1951 (70 y.o. [name] Primary Care [name]: [name] Other Clinician: Referring [name]: Treating [name]/Extender: [name] in Treatment: 3 Visit Information History Since Last Visit Added or deleted any medications: No Patient Arrived: Ambulatory Any new allergies or adverse reactions: No Arrival Time: 13:30 Had a fall or experienced change in No Accompanied By: alone activities of daily living that may affect Transfer Assistance: None risk of falls: Patient Identification Verified: Yes Signs or symptoms of abuse/neglect since last visito No Secondary Verification Process Completed: Yes Hospitalized since last visit: No Patient Requires Transmission-Based Precautions: No Implantable device outside of the clinic excluding No Patient Has Alerts: Yes cellular tissue based products placed in the center Patient Alerts: Patient on Blood Thinner since last visit: ABI R=0.95 Has Dressing in Place as Prescribed: No ABI L=1.21 Pain Present Now: No Electronic Signature(s) Signed: 01/03/2021 4:56:05 PM By: Wyatt Hurst RN, BSN Entered By: [name] on 01/03/2021 13:42:34 -------------------------------------------------------------------------------- Clinic Level of Care Assessment Details Patient Name: Date of Service: [name], [name] 01/03/2021 1:00 PM Medical Record Number: [id number] Patient Account Number: [id number] Date of Birth/Sex: Treating RN: 18-Nov-1950 (70 y.o. [name] Primary Care [name]: [name] Other Clinician: Referring [name]: Treating [name]/Extender: [name] in Treatment: 3 Clinic Level of Care Assessment  Items TOOL 4 Quantity Score X- 1 0 Use when only an EandM is performed on FOLLOW-UP visit ASSESSMENTS - Nursing Assessment / Reassessment X- 1 10 Reassessment of Co-morbidities (includes updates in patient status) X- 1 5 Reassessment of Adherence to Treatment Plan ASSESSMENTS - Wound and Skin A ssessment / Reassessment X - Simple Wound Assessment / Reassessment - one wound 1 5 []  - 0 Complex Wound Assessment / Reassessment - multiple wounds []  - 0 Dermatologic / Skin Assessment (not related to wound area) ASSESSMENTS - Focused Assessment []  - 0 Circumferential Edema Measurements - multi extremities []  - 0 Nutritional Assessment / Counseling / Intervention X- 1 5 Lower Extremity Assessment (monofilament, tuning fork, pulses) []  - 0 Peripheral Arterial Disease Assessment (using hand held doppler) ASSESSMENTS - Ostomy and/or Continence Assessment and Care []  - 0 Incontinence Assessment and Management []  - 0 Ostomy Care Assessment and Management (repouching, etc.) PROCESS - Coordination of Care X - Simple Patient / Family Education for ongoing care 1 15 []  - 0 Complex (extensive) Patient / Family Education for ongoing care X- 1 10 Staff obtains Programmer, systems, Records, T Results / Process Orders est []  - 0 Staff telephones HHA, Nursing Homes / Clarify orders / etc []  - 0 Routine Transfer to another Facility (non-emergent condition) []  - 0 Routine Hospital Admission (non-emergent condition) []  - 0 New Admissions / Biomedical engineer / Ordering NPWT Apligraf, etc. , []  - 0 Emergency Hospital Admission (emergent condition) X- 1 10 Simple Discharge Coordination []  - 0 Complex (extensive) Discharge Coordination PROCESS - Special Needs []  - 0 Pediatric / Minor Patient Management []  - 0 Isolation Patient Management []  - 0 Hearing / Language / Visual special needs []  - 0 Assessment of Community assistance (transportation, D/C planning, etc.) []  - 0 Additional  assistance / Altered mentation []  - 0 Support Surface(s) Assessment (bed, cushion, seat, etc.) INTERVENTIONS - Wound Cleansing / Measurement X - Simple Wound Cleansing - one wound 1  5 []  - 0 Complex Wound Cleansing - multiple wounds []  - 0 Wound Imaging (photographs - any number of wounds) []  - 0 Wound Tracing (instead of photographs) X- 1 5 Simple Wound Measurement - one wound []  - 0 Complex Wound Measurement - multiple wounds INTERVENTIONS - Wound Dressings []  - 0 Small Wound Dressing one or multiple wounds []  - 0 Medium Wound Dressing one or multiple wounds X- 1 20 Large Wound Dressing one or multiple wounds []  - 0 Application of Medications - topical []  - 0 Application of Medications - injection INTERVENTIONS - Miscellaneous []  - 0 External ear exam []  - 0 Specimen Collection (cultures, biopsies, blood, body fluids, etc.) []  - 0 Specimen(s) / Culture(s) sent or taken to Lab for analysis []  - 0 Patient Transfer (multiple staff / Civil Service fast streamer / Similar devices) []  - 0 Simple Staple / Suture removal (25 or less) []  - 0 Complex Staple / Suture removal (26 or more) []  - 0 Hypo / Hyperglycemic Management (close monitor of Blood Glucose) []  - 0 Ankle / Brachial Index (ABI) - do not check if billed separately X- 1 5 Vital Signs Has the patient been seen at the hospital within the last three years: Yes Total Score: 95 Level Of Care: New/Established - Level 3 Electronic Signature(s) Signed: 01/03/2021 4:56:05 PM By: Wyatt Hurst RN, BSN Entered By: [name] on 01/03/2021 13:43:48 -------------------------------------------------------------------------------- Encounter Discharge Information Details Patient Name: Date of Service: Wyatt Loth. 01/03/2021 1:00 PM Medical Record Number: [id number] Patient Account Number: [id number] Date of Birth/Sex: Treating RN: 12-Mar-1951 (70 y.o. [name] Primary Care [name]: [name] Other  Clinician: Referring [name]: Treating [name]/Extender: [name] in Treatment: 3 Encounter Discharge Information Items Discharge Condition: Stable Ambulatory Status: Ambulatory Discharge Destination: Home Transportation: Private Auto Accompanied By: alone Schedule Follow-up Appointment: Yes Clinical Summary of Care: Patient Declined Electronic Signature(s) Signed: 01/03/2021 4:56:05 PM By: Wyatt Hurst RN, BSN Entered By: [name] on 01/03/2021 13:44:46 -------------------------------------------------------------------------------- Wound Assessment Details Patient Name: Date of Service: Wyatt Loth. 01/03/2021 1:00 PM Medical Record Number: [id number] Patient Account Number: [id number] Date of Birth/Sex: Treating RN: March 24, 1951 (70 y.o. [name] Primary Care Tawnia Schirm: [name] Other Clinician: Referring Dereon Williamsen: Treating Selinda Korzeniewski/Extender: [name] in Treatment: 3 Wound Status Wound Number: 1 Primary Dehisced Wound Etiology: Wound Location: Right, Medial Lower Leg Wound Open Wounding Event: Surgical Injury Status: Date Acquired: 11/16/2020 Comorbid Coronary Artery Disease, Peripheral Arterial Disease, Weeks Of Treatment: 3 History: Peripheral Venous Disease Clustered Wound: No Wound Measurements Length: (cm) 2.6 Width: (cm) 0.4 Depth: (cm) 0.2 Area: (cm) 0.817 Volume: (cm) 0.163 Wound Description Classification: Full Thickness Without Exposed Support Structu Wound Margin: Well defined, not attached Exudate Amount: Medium Exudate Type: Serosanguineous Exudate Color: red, brown Foul Odor After Cleansing: Slough/Fibrino % Reduction in Area: 85.1% % Reduction in Volume: 92.6% Epithelialization: Small (1-33%) Tunneling: No Undermining: No res No Yes Wound Bed Granulation Amount: Large (67-100%) Exposed Structure Granulation Quality: Red Fascia Exposed: No Necrotic Amount:  Small (1-33%) Fat Layer (Subcutaneous Tissue) Exposed: Yes Necrotic Quality: Adherent Slough Tendon Exposed: No Muscle Exposed: No Joint Exposed: No Bone Exposed: No Treatment Notes Wound #1 (Lower Leg) Wound Laterality: Right, Medial Cleanser Soap and Water Discharge Instruction: May shower and wash wound with dial antibacterial soap and water prior to dressing change. Wound Cleanser Discharge Instruction: Cleanse the wound with wound cleanser prior to applying a clean dressing using gauze sponges, not tissue or  cotton balls. Peri-Wound Care Sween Lotion (Moisturizing lotion) Discharge Instruction: Apply moisturizing lotion as directed Topical Primary Dressing Hydrofera Blue Classic Foam, 4x4 in Discharge Instruction: Moisten with saline prior to applying to wound bed. Apply over Santyl. Santyl Ointment Discharge Instruction: Apply nickel thick amount to wound bed as instructed Secondary Dressing Woven Gauze Sponge, Non-Sterile 4x4 in Discharge Instruction: Apply over primary dressing as directed. ABD Pad, 5x9 Discharge Instruction: Apply over primary dressing as directed. Secured With Compression Wrap Kerlix Roll 4.5x3.1 (in/yd) Discharge Instruction: Apply Kerlix and Coban compression as directed. Coban Self-Adherent Wrap 4x5 (in/yd) Discharge Instruction: Apply over Kerlix as directed. Compression Stockings Add-Ons Electronic Signature(s) Signed: 01/03/2021 4:56:05 PM By: Wyatt Hurst RN, BSN Entered By: [name] on 01/03/2021 13:43:02 -------------------------------------------------------------------------------- Wound Assessment Details Patient Name: Date of Service: [name], [name] 01/03/2021 1:00 PM Medical Record Number: [id number] Patient Account Number: [id number] Date of Birth/Sex: Treating RN: Aug 05, 1950 (70 y.o. [name] Primary Care Alyne Martinson: Other Clinician: Sueanne Jackson Referring Gennesis Hogland: Treating Aliyanna Wassmer/Extender: [name] in Treatment: 3 Wound Status Wound Number: 2 Primary Arterial Insufficiency Ulcer Etiology: Wound Location: Right T Great oe Wound Open Wounding Event: Gradually Appeared Status: Date Acquired: 09/18/2020 Comorbid Coronary Artery Disease, Peripheral Arterial Disease, Weeks Of Treatment: 3 History: Peripheral Venous Disease Clustered Wound: No Wound Measurements Length: (cm) 1.8 Width: (cm) 1.3 Depth: (cm) 0.1 Area: (cm) 1.838 Volume: (cm) 0.184 % Reduction in Area: -5.9% % Reduction in Volume: 64.7% Epithelialization: None Tunneling: No Undermining: No Wound Description Classification: Full Thickness Without Exposed Support Structures Wound Margin: Distinct, outline attached Exudate Amount: Small Exudate Type: Serosanguineous Exudate Color: red, brown Foul Odor After Cleansing: No Slough/Fibrino No Wound Bed Granulation Amount: None Present (0%) Exposed Structure Necrotic Amount: Large (67-100%) Fascia Exposed: No Necrotic Quality: Eschar Fat Layer (Subcutaneous Tissue) Exposed: Yes Tendon Exposed: No Muscle Exposed: No Joint Exposed: No Bone Exposed: No Treatment Notes Wound #2 (Toe Great) Wound Laterality: Right Cleanser Soap and Water Discharge Instruction: May shower and wash wound with dial antibacterial soap and water prior to dressing change. Peri-Wound Care Topical Primary Dressing betadine Discharge Instruction: apply daily Secondary Dressing Woven Gauze Sponge, Non-Sterile 4x4 in Discharge Instruction: Apply over primary dressing as directed. Secured With 49M Medipore H Soft Cloth Surgical T 4 x 2 (in/yd) ape Discharge Instruction: Secure dressing with tape as directed. Compression Wrap Compression Stockings Add-Ons Electronic Signature(s) Signed: 01/03/2021 4:56:05 PM By: Wyatt Hurst RN, BSN Entered By: [name] on 01/03/2021  13:43:09 -------------------------------------------------------------------------------- Wound Assessment Details Patient Name: Date of Service: [name], [name] 01/03/2021 1:00 PM Medical Record Number: [id number] Patient Account Number: [id number] Date of Birth/Sex: Treating RN: June 13, 1951 (70 y.o. [name] Primary Care Zina Pitzer: [name] Other Clinician: Referring Yuliana Vandrunen: Treating Romar Woodrick/Extender: [name] in Treatment: 3 Wound Status Wound Number: 3 Primary Venous Leg Ulcer Etiology: Wound Location: Right, Distal, Medial Lower Leg Wound Open Wounding Event: Gradually Appeared Status: Date Acquired: 12/11/2020 Comorbid Coronary Artery Disease, Peripheral Arterial Disease, Weeks Of Treatment: 3 History: Peripheral Venous Disease Clustered Wound: No Wound Measurements Length: (cm) 0.4 Width: (cm) 0.2 Depth: (cm) 0.2 Area: (cm) 0.063 Volume: (cm) 0.013 % Reduction in Area: 61.8% % Reduction in Volume: 60.6% Epithelialization: Medium (34-66%) Tunneling: No Undermining: No Wound Description Classification: Full Thickness Without Exposed Support Structures Wound Margin: Distinct, outline attached Exudate Amount: Medium Exudate Type: Serosanguineous Exudate Color: red, brown Foul Odor After Cleansing: No Slough/Fibrino No Wound Bed Granulation Amount: Large (67-100%)  Exposed Structure Granulation Quality: Red, Pink Fascia Exposed: No Necrotic Amount: None Present (0%) Fat Layer (Subcutaneous Tissue) Exposed: Yes Tendon Exposed: No Muscle Exposed: No Joint Exposed: No Bone Exposed: No Treatment Notes Wound #3 (Lower Leg) Wound Laterality: Right, Medial, Distal Cleanser Soap and Water Discharge Instruction: May shower and wash wound with dial antibacterial soap and water prior to dressing change. Wound Cleanser Discharge Instruction: Cleanse the wound with wound cleanser prior to applying a clean dressing using  gauze sponges, not tissue or cotton balls. Peri-Wound Care Sween Lotion (Moisturizing lotion) Discharge Instruction: Apply moisturizing lotion as directed Topical Primary Dressing KerraCel Ag Gelling Fiber Dressing, 2x2 in (silver alginate) Discharge Instruction: Apply silver alginate to wound bed as instructed Secondary Dressing Woven Gauze Sponge, Non-Sterile 4x4 in Discharge Instruction: Apply over primary dressing as directed. Secured With Compression Wrap Kerlix Roll 4.5x3.1 (in/yd) Discharge Instruction: Apply Kerlix and Coban compression as directed. Coban Self-Adherent Wrap 4x5 (in/yd) Discharge Instruction: Apply over Kerlix as directed. Compression Stockings Add-Ons Electronic Signature(s) Signed: 01/03/2021 4:56:05 PM By: Wyatt Hurst RN, BSN Entered By: [name] on 01/03/2021 13:43:15

## 2021-01-05 ENCOUNTER — Other Ambulatory Visit: Payer: Self-pay

## 2021-01-05 DIAGNOSIS — I739 Peripheral vascular disease, unspecified: Secondary | ICD-10-CM

## 2021-01-09 ENCOUNTER — Encounter (HOSPITAL_BASED_OUTPATIENT_CLINIC_OR_DEPARTMENT_OTHER): Payer: PPO | Attending: Internal Medicine | Admitting: Internal Medicine

## 2021-01-09 ENCOUNTER — Other Ambulatory Visit: Payer: Self-pay

## 2021-01-09 DIAGNOSIS — T8130XD Disruption of wound, unspecified, subsequent encounter: Secondary | ICD-10-CM | POA: Diagnosis not present

## 2021-01-09 DIAGNOSIS — X58XXXD Exposure to other specified factors, subsequent encounter: Secondary | ICD-10-CM | POA: Insufficient documentation

## 2021-01-09 DIAGNOSIS — I739 Peripheral vascular disease, unspecified: Secondary | ICD-10-CM | POA: Diagnosis not present

## 2021-01-09 DIAGNOSIS — I2510 Atherosclerotic heart disease of native coronary artery without angina pectoris: Secondary | ICD-10-CM | POA: Diagnosis not present

## 2021-01-09 DIAGNOSIS — L97519 Non-pressure chronic ulcer of other part of right foot with unspecified severity: Secondary | ICD-10-CM | POA: Insufficient documentation

## 2021-01-09 DIAGNOSIS — L97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 with unspecified severity: Secondary | ICD-10-CM | POA: Diagnosis not present

## 2021-01-09 NOTE — Progress Notes (Signed)
LINCON, SAHLIN ([id number]) Visit Report for 01/09/2021 Arrival Information Details Patient Name: Date of Service: [name], [name] 01/09/2021 8:30 A M Medical Record Number: [id number] Patient Account Number: [id number] Date of Birth/Sex: Treating RN: [birth year] (70 y.o. [name] Primary Care Tereasa Yilmaz: [name] Other Clinician: Referring Kesia Dalto: Treating Brea Coleson/Extender: Bartholome Bill in Treatment: 4 Visit Information History Since Last Visit Added or deleted any medications: No Patient Arrived: Ambulatory Any new allergies or adverse reactions: No Arrival Time: 08:49 Had a fall or experienced change in No Accompanied By: self activities of daily living that may affect Transfer Assistance: None risk of falls: Patient Identification Verified: Yes Signs or symptoms of abuse/neglect since last visito No Secondary Verification Process Completed: Yes Hospitalized since last visit: No Patient Requires Transmission-Based Precautions: No Implantable device outside of the clinic excluding No Patient Has Alerts: Yes cellular tissue based products placed in the center Patient Alerts: Patient on Blood Thinner since last visit: ABI R=0.95 Has Dressing in Place as Prescribed: Yes ABI L=1.21 Pain Present Now: No Electronic Signature(s) Signed: 01/09/2021 3:40:40 PM By: Sandre Kitty Entered By: Sandre Kitty on 01/09/2021 08:49:25 -------------------------------------------------------------------------------- Encounter Discharge Information Details Patient Name: Date of Service: [name]. 01/09/2021 8:30 A M Medical Record Number: [id number] Patient Account Number: [id number] Date of Birth/Sex: Treating RN: Dec 05, 1950 (70 y.o. [name] Primary Care Rachelanne Whidby: [name] Other Clinician: Referring Khalid Lacko: Treating Araceli Arango/Extender: Bartholome Bill in Treatment: 4 Encounter Discharge Information Items  Post Procedure Vitals Discharge Condition: Stable Temperature (F): 97.9 Ambulatory Status: Ambulatory Pulse (bpm): 82 Discharge Destination: Home Respiratory Rate (breaths/min): 16 Transportation: Private Auto Blood Pressure (mmHg): 144/61 Accompanied By: self Schedule Follow-up Appointment: Yes Clinical Summary of Care: Electronic Signature(s) Signed: 01/09/2021 6:55:28 PM By: Deon Pilling Entered By: Deon Pilling on 01/09/2021 09:53:28 -------------------------------------------------------------------------------- Lower Extremity Assessment Details Patient Name: Date of Service: Wyatt, Jackson 01/09/2021 8:30 A M Medical Record Number: [id number] Patient Account Number: [id number] Date of Birth/Sex: Treating RN: [birth year] (70 y.o. [name] Primary Care Amaria Mundorf: [name] Other Clinician: Referring Kalan Rinn: Treating Anjuli Gemmill/Extender: Vivia Budge Weeks in Treatment: 4 Edema Assessment Assessed: [Left: No] [Right: No] Edema: [Left: Ye] [Right: s] Calf Left: Right: Point of Measurement: From Medial Instep 35 cm Ankle Left: Right: Point of Measurement: From Medial Instep 24.7 cm Vascular Assessment Pulses: Dorsalis Pedis Palpable: [Right:No] Electronic Signature(s) Signed: 01/09/2021 7:10:16 PM By: Baruch Gouty RN, BSN Entered By: Baruch Gouty on 01/09/2021 09:11:35 -------------------------------------------------------------------------------- Multi Wound Chart Details Patient Name: Date of Service: [name]. 01/09/2021 8:30 A M Medical Record Number: [id number] Patient Account Number: [id number] Date of Birth/Sex: Treating RN: 1950-09-26 (70 y.o. [name], Meta.Reding Primary Care Kalani Sthilaire: [name] Other Clinician: Referring Lorrane Mccay: Treating Maia Handa/Extender: Bartholome Bill in Treatment: 4 Vital Signs Height(in): 72 Pulse(bpm): 81 Weight(lbs): 162 Blood Pressure(mmHg):  144/61 Body Mass Index(BMI): 22 Temperature(F): 97.9 Respiratory Rate(breaths/min): 16 Photos: [1:No Photos Right, Medial Lower Leg] [2:No Photos Right T Great oe] [3:No Photos Right, Distal, Medial Lower Leg] Wound Location: [1:Surgical Injury] [2:Gradually Appeared] [3:Gradually Appeared] Wounding Event: [1:Dehisced Wound] [2:Arterial Insufficiency Ulcer] [3:Venous Leg Ulcer] Primary Etiology: [1:Coronary Artery Disease, Peripheral] [2:Coronary Artery Disease, Peripheral] [3:Coronary Artery Disease, Peripheral] Comorbid History: [1:Arterial Disease, Peripheral Venous Disease 11/16/2020] [2:Arterial Disease, Peripheral Venous Disease 09/18/2020] [3:Arterial Disease, Peripheral Venous Disease 12/11/2020] Date Acquired: [1:4] [2:4] [3:4] Weeks of Treatment: [1:Open] [2:Open] [3:Open] Wound Status: [1:1.5x0.2x0.1] [2:1.5x1.6x0.1] [3:0.7x0.5x0.8] Measurements L x W  x D (cm) [1:0.236] [2:1.885] [3:0.275] A (cm) : rea [1:0.024] [2:0.188] [3:0.22] Volume (cm) : [1:95.70%] [2:-8.60%] [3:-66.70%] % Reduction in A rea: [1:98.90%] [2:63.90%] [3:-566.70%] % Reduction in Volume: [3:9] Starting Position 1 (o'clock): [3:3] Ending Position 1 (o'clock): [3:1.9] Maximum Distance 1 (cm): [1:No] [2:No] [3:Yes] Undermining: [1:Full Thickness Without Exposed] [2:Full Thickness Without Exposed] [3:Full Thickness Without Exposed] Classification: [1:Support Structures Small] [2:Support Structures Small] [3:Support Structures Medium] Exudate A mount: [1:Serosanguineous] [2:Serous] [3:Serosanguineous] Exudate Type: [1:red, brown] [2:amber] [3:red, brown] Exudate Color: [1:Distinct, outline attached] [2:Distinct, outline attached] [3:Distinct, outline attached] Wound Margin: [1:Large (67-100%)] [2:None Present (0%)] [3:Large (67-100%)] Granulation A mount: [1:Red] [2:[name]] [3:Red, Pink] Granulation Quality: [1:None Present (0%)] [2:Large (67-100%)] [3:Small (1-33%)] Necrotic A mount: [1:[name]] [2:Eschar] [3:Adherent  Slough] Necrotic Tissue: [1:Fat Layer (Subcutaneous Tissue): Yes Fat Layer (Subcutaneous Tissue): Yes Fat Layer (Subcutaneous Tissue): Yes] Exposed Structures: [1:Fascia: No Tendon: No Muscle: No Joint: No Bone: No Small (1-33%)] [2:Fascia: No Tendon: No Muscle: No Joint: No Bone: No None] [3:Fascia: No Tendon: No Muscle: No Joint: No Bone: No Small (1-33%)] Epithelialization: [1:Debridement - Excisional] [2:[name]] [3:Debridement - Excisional] Debridement: Pre-procedure Verification/Time Out 09:20 [2:[name]] [3:09:20] Taken: [1:Lidocaine 4% Topical Solution] [2:[name]] [3:Lidocaine 4% Topical Solution] Pain Control: [1:Subcutaneous, Slough] [2:[name]] [3:Subcutaneous, Slough] Tissue Debrided: [1:Skin/Subcutaneous Tissue] [2:[name]] [3:Skin/Subcutaneous Tissue] Level: [1:0.45] [2:[name]] [3:0.35] Debridement A (sq cm): [1:rea Curette] [2:[name]] [3:Curette] Instrument: [1:Minimum] [2:[name]] [3:Minimum] Bleeding: [1:Pressure] [2:[name]] [3:Pressure] Hemostasis A chieved: [1:0] [2:[name]] [3:0] Procedural Pain: [1:0] [2:[name]] [3:0] Post Procedural Pain: [1:Procedure was tolerated well] [2:[name]] [3:Procedure was tolerated well] Debridement Treatment Response: [1:1.5x0.2x0.2] [2:[name]] [3:0.7x0.5x0.8] Post Debridement Measurements L x W x D (cm) [1:0.047] [2:[name]] [3:0.22] Post Debridement Volume: (cm) [1:[name]] [2:[name]] [3:red and tender periwound] Assessment Notes: [1:Debridement] [2:[name]] [3:Debridement] Treatment Notes Electronic Signature(s) Signed: 01/09/2021 10:10:47 AM By: Kalman Shan DO Signed: 01/09/2021 6:55:28 PM By: Deon Pilling Entered By: Kalman Shan on 01/09/2021 09:35:29 -------------------------------------------------------------------------------- Multi-Disciplinary Care Plan Details Patient Name: Date of Service: [name]. 01/09/2021 8:30 A M Medical Record Number: [id number] Patient Account Number: [id number] Date of Birth/Sex: Treating RN: 1951-02-08 (70 y.o. [name] Primary  Care Gailen Venne: [name] Other Clinician: Referring Dorothie Wah: Treating Lukasz Rogus/Extender: Bartholome Bill in Treatment: 4 Active Inactive Wound/Skin Impairment Nursing Diagnoses: Impaired tissue integrity Knowledge deficit related to ulceration/compromised skin integrity Goals: Patient will have a decrease in wound volume by X% from date: (specify in notes) Date Initiated: 12/07/2020 Target Resolution Date: 02/02/2021 Goal Status: Active Patient/caregiver will verbalize understanding of skin care regimen Date Initiated: 12/07/2020 Target Resolution Date: 02/01/2021 Goal Status: Active Ulcer/skin breakdown will have a volume reduction of 30% by week 4 Date Initiated: 12/07/2020 Target Resolution Date: 01/13/2021 Goal Status: Active Interventions: Assess patient/caregiver ability to obtain necessary supplies Assess patient/caregiver ability to perform ulcer/skin care regimen upon admission and as needed Assess ulceration(s) every visit Notes: Electronic Signature(s) Signed: 01/09/2021 6:55:28 PM By: Deon Pilling Entered By: Deon Pilling on 01/09/2021 08:51:20 -------------------------------------------------------------------------------- Pain Assessment Details Patient Name: Date of Service: ELIZABETH, HAFF 01/09/2021 8:30 A M Medical Record Number: [id number] Patient Account Number: [id number] Date of Birth/Sex: Treating RN: 03-22-1951 (70 y.o. [name] Primary Care Ramses Klecka: [name] Other Clinician: Referring Jeannia Tatro: Treating Trayveon Beckford/Extender: Bartholome Bill in Treatment: 4 Active Problems Location of Pain Severity and Description of Pain Patient Has Paino No Site Locations Rate the pain. Current Pain Level: 0 Pain Management and Medication Current Pain Management: Electronic Signature(s) Signed: 01/09/2021 6:55:28 PM By: Deon Pilling Signed:  01/09/2021 7:10:16 PM By: Baruch Gouty RN, BSN Entered By:  Baruch Gouty on 01/09/2021 09:07:18 -------------------------------------------------------------------------------- Patient/Caregiver Education Details Patient Name: Date of Service: [name] 7/5/2022andnbsp8:30 A M Medical Record Number: [id number] Patient Account Number: [id number] Date of Birth/Gender: Treating RN: 1950-11-06 (70 y.o. [name] Primary Care Physician: [name] Other Clinician: Referring Physician: Treating Physician/Extender: Bartholome Bill in Treatment: 4 Education Assessment Education Provided To: Patient Education Topics Provided Wound/Skin Impairment: Handouts: Skin Care Do's and Dont's Methods: Explain/Verbal Responses: Reinforcements needed Electronic Signature(s) Signed: 01/09/2021 6:55:28 PM By: Deon Pilling Entered By: Deon Pilling on 01/09/2021 08:51:30 -------------------------------------------------------------------------------- Wound Assessment Details Patient Name: Date of Service: DONELL, TOMKINS 01/09/2021 8:30 A M Medical Record Number: [id number] Patient Account Number: [id number] Date of Birth/Sex: Treating RN: Oct 16, 1950 (70 y.o. [name], Meta.Reding Primary Care Kitara Hebb: [name] Other Clinician: Referring Lovenia Debruler: Treating Alaija Ruble/Extender: Vivia Budge Weeks in Treatment: 4 Wound Status Wound Number: 1 Primary Dehisced Wound Etiology: Wound Location: Right, Medial Lower Leg Wound Open Wounding Event: Surgical Injury Status: Date Acquired: 11/16/2020 Comorbid Coronary Artery Disease, Peripheral Arterial Disease, Weeks Of Treatment: 4 History: Peripheral Venous Disease Clustered Wound: No Photos Wound Measurements Length: (cm) 1.5 Width: (cm) 0.2 Depth: (cm) 0.1 Area: (cm) 0.236 Volume: (cm) 0.024 % Reduction in Area: 95.7% % Reduction in Volume: 98.9% Epithelialization: Small (1-33%) Tunneling: No Undermining: No Wound  Description Classification: Full Thickness Without Exposed Support Structures Wound Margin: Distinct, outline attached Exudate Amount: Small Exudate Type: Serosanguineous Exudate Color: red, brown Foul Odor After Cleansing: No Slough/Fibrino Yes Wound Bed Granulation Amount: Large (67-100%) Exposed Structure Granulation Quality: Red Fascia Exposed: No Necrotic Amount: None Present (0%) Fat Layer (Subcutaneous Tissue) Exposed: Yes Tendon Exposed: No Muscle Exposed: No Joint Exposed: No Bone Exposed: No Treatment Notes Wound #1 (Lower Leg) Wound Laterality: Right, Medial Cleanser Soap and Water Discharge Instruction: May shower and wash wound with dial antibacterial soap and water prior to dressing change. Wound Cleanser Discharge Instruction: Cleanse the wound with wound cleanser prior to applying a clean dressing using gauze sponges, not tissue or cotton balls. Peri-Wound Care Sween Lotion (Moisturizing lotion) Discharge Instruction: Apply moisturizing lotion as directed Topical Primary Dressing Hydrofera Blue Classic Foam, 4x4 in Discharge Instruction: Moisten with saline prior to applying to wound bed. Apply over Santyl. Santyl Ointment Discharge Instruction: Apply nickel thick amount to wound bed as instructed Secondary Dressing Woven Gauze Sponge, Non-Sterile 4x4 in Discharge Instruction: Apply over primary dressing as directed. ABD Pad, 5x9 Discharge Instruction: Apply over primary dressing as directed. Secured With Compression Wrap Kerlix Roll 4.5x3.1 (in/yd) Discharge Instruction: Apply Kerlix and Coban compression as directed. Coban Self-Adherent Wrap 4x5 (in/yd) Discharge Instruction: Apply over Kerlix as directed. Compression Stockings Add-Ons Electronic Signature(s) Signed: 01/09/2021 5:48:24 PM By: Sandre Kitty Signed: 01/09/2021 6:55:28 PM By: Deon Pilling Entered By: Sandre Kitty on 01/09/2021  17:41:46 -------------------------------------------------------------------------------- Wound Assessment Details Patient Name: Date of Service: [name]. 01/09/2021 8:30 A M Medical Record Number: [id number] Patient Account Number: [id number] Date of Birth/Sex: Treating RN: [birth year] (70 y.o. [name] Primary Care Langley Flatley: [name] Other Clinician: Referring Tieisha Darden: Treating Saroya Riccobono/Extender: Vivia Budge Weeks in Treatment: 4 Wound Status Wound Number: 2 Primary Arterial Insufficiency Ulcer Etiology: Wound Location: Right T Great oe Wound Open Wounding Event: Gradually Appeared Status: Date Acquired: 09/18/2020 Comorbid Coronary Artery Disease, Peripheral Arterial Disease, Weeks Of Treatment: 4 History: Peripheral Venous Disease Clustered Wound: No Photos Wound Measurements Length: (  cm) 1.5 Width: (cm) 1.6 Depth: (cm) 0.1 Area: (cm) 1.885 Volume: (cm) 0.188 % Reduction in Area: -8.6% % Reduction in Volume: 63.9% Epithelialization: None Tunneling: No Undermining: No Wound Description Classification: Full Thickness Without Exposed Support Structures Wound Margin: Distinct, outline attached Exudate Amount: Small Exudate Type: Serous Exudate Color: amber Foul Odor After Cleansing: No Slough/Fibrino No Wound Bed Granulation Amount: None Present (0%) Exposed Structure Necrotic Amount: Large (67-100%) Fascia Exposed: No Necrotic Quality: Eschar Fat Layer (Subcutaneous Tissue) Exposed: Yes Tendon Exposed: No Muscle Exposed: No Joint Exposed: No Bone Exposed: No Treatment Notes Wound #2 (Toe Great) Wound Laterality: Right Cleanser Soap and Water Discharge Instruction: May shower and wash wound with dial antibacterial soap and water prior to dressing change. Peri-Wound Care Topical Primary Dressing betadine Discharge Instruction: apply daily Secondary Dressing Woven Gauze Sponge, Non-Sterile 4x4 in Discharge  Instruction: Apply over primary dressing as directed. Secured With 21M Medipore H Soft Cloth Surgical T 4 x 2 (in/yd) ape Discharge Instruction: Secure dressing with tape as directed. Compression Wrap Compression Stockings Add-Ons Electronic Signature(s) Signed: 01/09/2021 5:48:24 PM By: Sandre Kitty Signed: 01/09/2021 6:55:28 PM By: Deon Pilling Entered By: Sandre Kitty on 01/09/2021 17:41:13 -------------------------------------------------------------------------------- Wound Assessment Details Patient Name: Date of Service: [name]. 01/09/2021 8:30 A M Medical Record Number: [id number] Patient Account Number: [id number] Date of Birth/Sex: Treating RN: 10/30/1950 (70 y.o. [name], Meta.Reding Primary Care Orma Cheetham: [name] Other Clinician: Referring Robbert Langlinais: Treating Jillaine Waren/Extender: Vivia Budge Weeks in Treatment: 4 Wound Status Wound Number: 3 Primary Venous Leg Ulcer Etiology: Wound Location: Right, Distal, Medial Lower Leg Wound Open Wounding Event: Gradually Appeared Status: Date Acquired: 12/11/2020 Comorbid Coronary Artery Disease, Peripheral Arterial Disease, Weeks Of Treatment: 4 History: Peripheral Venous Disease Clustered Wound: No Photos Wound Measurements Length: (cm) 0.7 Width: (cm) 0.5 Depth: (cm) 0.8 Area: (cm) 0.275 Volume: (cm) 0.22 % Reduction in Area: -66.7% % Reduction in Volume: -566.7% Epithelialization: Small (1-33%) Tunneling: No Undermining: Yes Starting Position (o'clock): 9 Ending Position (o'clock): 3 Maximum Distance: (cm) 1.9 Wound Description Classification: Full Thickness Without Exposed Support Structures Wound Margin: Distinct, outline attached Exudate Amount: Medium Exudate Type: Serosanguineous Exudate Color: red, brown Foul Odor After Cleansing: No Slough/Fibrino No Wound Bed Granulation Amount: Large (67-100%) Exposed Structure Granulation Quality: Red, Pink Fascia Exposed:  No Necrotic Amount: Small (1-33%) Fat Layer (Subcutaneous Tissue) Exposed: Yes Necrotic Quality: Adherent Slough Tendon Exposed: No Muscle Exposed: No Joint Exposed: No Bone Exposed: No Assessment Notes red and tender periwound Treatment Notes Wound #3 (Lower Leg) Wound Laterality: Right, Medial, Distal Cleanser Soap and Water Discharge Instruction: May shower and wash wound with dial antibacterial soap and water prior to dressing change. Wound Cleanser Discharge Instruction: Cleanse the wound with wound cleanser prior to applying a clean dressing using gauze sponges, not tissue or cotton balls. Peri-Wound Care Sween Lotion (Moisturizing lotion) Discharge Instruction: Apply moisturizing lotion as directed Topical Primary Dressing KerraCel Ag Gelling Fiber Dressing, 2x2 in (silver alginate) Discharge Instruction: lightly pack into undermining and wound bed as instructed Secondary Dressing Woven Gauze Sponge, Non-Sterile 4x4 in Discharge Instruction: Apply over primary dressing as directed. Secured With Compression Wrap Kerlix Roll 4.5x3.1 (in/yd) Discharge Instruction: Apply Kerlix and Coban compression as directed. Coban Self-Adherent Wrap 4x5 (in/yd) Discharge Instruction: Apply over Kerlix as directed. Compression Stockings Add-Ons Electronic Signature(s) Signed: 01/09/2021 5:48:24 PM By: Sandre Kitty Signed: 01/09/2021 6:55:28 PM By: Deon Pilling Entered By: Sandre Kitty on 01/09/2021 17:41:30 -------------------------------------------------------------------------------- Vitals Details Patient Name: Date of  Service: DAVONNE, JARNIGAN 01/09/2021 8:30 A M Medical Record Number: [id number] Patient Account Number: [id number] Date of Birth/Sex: Treating RN: 25-Jun-1951 (70 y.o. [name] Primary Care Allyah Heather: [name] Other Clinician: Referring Lucca Ballo: Treating Dalten Ambrosino/Extender: Bartholome Bill in Treatment: 4 Vital  Signs Time Taken: 08:49 Temperature (F): 97.9 Height (in): 72 Pulse (bpm): 82 Weight (lbs): 162 Respiratory Rate (breaths/min): 16 Body Mass Index (BMI): 22 Blood Pressure (mmHg): 144/61 Reference Range: 80 - 120 mg / dl Electronic Signature(s) Signed: 01/09/2021 3:40:40 PM By: Sandre Kitty Entered By: Sandre Kitty on 01/09/2021 08:49:45

## 2021-01-09 NOTE — Progress Notes (Signed)
[name], [name] ([id number]) Visit Report for 01/09/2021 Chief Complaint Document Details Patient Name: Date of Service: [name], [name] 01/09/2021 8:30 A M Medical Record Number: [id number] Patient Account Number: [id number] Date of Birth/Sex: Treating RN: July 02, 1951 (70 y.o. [name] Primary Care Provider: [name] Other Clinician: Referring Provider: Treating Provider/Extender: Bartholome Bill in Treatment: 4 Information Obtained from: Patient Chief Complaint Surgical wound dehiscence of right lower extremity and Great toe wound. Electronic Signature(s) Signed: 01/09/2021 10:10:47 AM By: Kalman Shan DO Entered By: Kalman Shan on 01/09/2021 09:35:37 -------------------------------------------------------------------------------- Debridement Details Patient Name: Date of Service: Wyatt Loth. 01/09/2021 8:30 A M Medical Record Number: [id number] Patient Account Number: [id number] Date of Birth/Sex: Treating RN: 11/30/1950 (70 y.o. [name], [name] Primary Care Provider: [name] Other Clinician: Referring Provider: Treating Provider/Extender: Bartholome Bill in Treatment: 4 Debridement Performed for Assessment: Wound #1 Right,Medial Lower Leg Performed By: Physician Kalman Shan, DO Debridement Type: Debridement Level of Consciousness (Pre-procedure): Awake and Alert Pre-procedure Verification/Time Out Yes - 09:20 Taken: Start Time: 09:21 Pain Control: Lidocaine 4% T opical Solution T Area Debrided (L x W): otal 1.5 (cm) x 0.3 (cm) = 0.45 (cm) Tissue and other material debrided: Viable, Non-Viable, Slough, Subcutaneous, Skin: Dermis , Skin: Epidermis, Fibrin/Exudate, Slough Level: Skin/Subcutaneous Tissue Debridement Description: Excisional Instrument: Curette Bleeding: Minimum Hemostasis Achieved: Pressure End Time: 09:25 Procedural Pain: 0 Post Procedural Pain: 0 Response to Treatment:  Procedure was tolerated well Level of Consciousness (Post- Awake and Alert procedure): Post Debridement Measurements of Total Wound Length: (cm) 1.5 Width: (cm) 0.2 Depth: (cm) 0.2 Volume: (cm) 0.047 Character of Wound/Ulcer Post Debridement: Requires Further Debridement Post Procedure Diagnosis Same as Pre-procedure Electronic Signature(s) Signed: 01/09/2021 10:10:47 AM By: Kalman Shan DO Signed: 01/09/2021 6:55:28 PM By: Deon Pilling Entered By: Deon Pilling on 01/09/2021 09:26:01 -------------------------------------------------------------------------------- Debridement Details Patient Name: Date of Service: Wyatt Loth. 01/09/2021 8:30 A M Medical Record Number: [id number] Patient Account Number: [id number] Date of Birth/Sex: Treating RN: 04/23/1951 (70 y.o. [name], [name] Primary Care Provider: [name] Other Clinician: Referring Provider: Treating Provider/Extender: Bartholome Bill in Treatment: 4 Debridement Performed for Assessment: Wound #3 Right,Distal,Medial Lower Leg Performed By: Physician Kalman Shan, DO Debridement Type: Debridement Severity of Tissue Pre Debridement: Fat layer exposed Level of Consciousness (Pre-procedure): Awake and Alert Pre-procedure Verification/Time Out Yes - 09:20 Taken: Start Time: 09:21 Pain Control: Lidocaine 4% T opical Solution T Area Debrided (L x W): otal 0.7 (cm) x 0.5 (cm) = 0.35 (cm) Tissue and other material debrided: Viable, Non-Viable, Slough, Subcutaneous, Skin: Dermis , Skin: Epidermis, Fibrin/Exudate, Slough Level: Skin/Subcutaneous Tissue Debridement Description: Excisional Instrument: Curette Bleeding: Minimum Hemostasis Achieved: Pressure End Time: 09:25 Procedural Pain: 0 Post Procedural Pain: 0 Response to Treatment: Procedure was tolerated well Level of Consciousness (Post- Awake and Alert procedure): Post Debridement Measurements of Total Wound Length: (cm)  0.7 Width: (cm) 0.5 Depth: (cm) 0.8 Volume: (cm) 0.22 Character of Wound/Ulcer Post Debridement: Stable Severity of Tissue Post Debridement: Fat layer exposed Post Procedure Diagnosis Same as Pre-procedure Electronic Signature(s) Signed: 01/09/2021 10:10:47 AM By: Kalman Shan DO Signed: 01/09/2021 6:55:28 PM By: Deon Pilling Entered By: Deon Pilling on 01/09/2021 09:26:30 -------------------------------------------------------------------------------- HPI Details Patient Name: Date of Service: Wyatt Loth. 01/09/2021 8:30 A M Medical Record Number: [id number] Patient Account Number: [id number] Date of Birth/Sex: Treating RN: 04/15/1951 (70 y.o. [name] Primary Care Provider: [name] Other Clinician: Referring Provider: Treating  Provider/Extender: Vivia Budge Weeks in Treatment: 4 History of Present Illness HPI Description: Admission 6/2 Mr. [name] is a 70 year old male with a past medical history of peripheral arterial disease status post right common femoral to peroneal bypass on 10/13/2020 and coil embolization of bypass fistula x2 on 11/01/2020. He presents today because he has wound dehiscence that occurred 3 weeks ago from his recent vascular surgery. He has been using wet-to-dry dressings to this area. He also has necrotic tissue to the right great toe. This was the original symptom that precipitated further vascular evaluation that led to his aortogram and ultimately bypass graft. He is currently keeping this area covered. He currently denies signs of infection. 6/6; patient presents for 1 week follow-up. He had Kerlix/Coban compression for the past week with Hydrofera Blue underneath. He reports tolerating this well without any issues. He thinks the wound looks smaller today. He has been using Betadine to the toe wound. He has no complaints today. He denies signs of infection. 6/20; patient presents for 2-week follow-up. He had  Kerlix/Coban compression with Hydrofera Blue. He has tolerated this well. He reports significant improvement in leg swelling. He also thinks the wound looks smaller. He has no complaints today and denies signs of infection. 6/27; patient presents for 1 week follow-up. He continues to use Hydrofera Blue under Kerlix/Coban compression and has tolerated this well. He continues to report improvement in leg swelling. He is content with the wound care progress at this time. He denies signs of infection. 7/5; patient presents for 1 week follow-up. He has been using Hydrofera Blue and Santyl under Kerlix/Coban. He reports tolerating this well. He has some mild tenderness to the distal portion of the right leg wound. He denies signs of infection. Electronic Signature(s) Signed: 01/09/2021 10:10:47 AM By: Kalman Shan DO Entered By: Kalman Shan on 01/09/2021 09:36:44 -------------------------------------------------------------------------------- Physical Exam Details Patient Name: Date of Service: CANE, DUBRAY 01/09/2021 8:30 A M Medical Record Number: [id number] Patient Account Number: [id number] Date of Birth/Sex: Treating RN: [birth year] (70 y.o. [name] Primary Care Provider: [name] Other Clinician: Referring Provider: Treating Provider/Extender: Vivia Budge Weeks in Treatment: 4 Constitutional respirations regular, non-labored and within target range for patient.. Cardiovascular 2+ dorsalis pedis/posterior tibialis pulses. Psychiatric pleasant and cooperative. Notes Right lower extremity: 2+ pitting edema to the knee. Open wound to the medial aspect with nonviable tissue and granulation tissue present. No signs of infection. T the most distal part there Is an open wound with some depth and about 3 cm of undermining. Some tenderness on exam and erythema. o Right foot: The right great toe has an eschar present. Electronic Signature(s) Signed:  01/09/2021 10:10:47 AM By: Kalman Shan DO Entered By: Kalman Shan on 01/09/2021 10:08:28 -------------------------------------------------------------------------------- Physician Orders Details Patient Name: Date of Service: Wyatt Loth. 01/09/2021 8:30 A M Medical Record Number: [id number] Patient Account Number: [id number] Date of Birth/Sex: Treating RN: December 14, 1950 (70 y.o. 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 - with Dr. Heber Shiner Monday Return A Bathing/ Shower/ Hygiene May shower with  protection but do not get wound dressing(s) wet. Edema Control - Lymphedema / SCD / Other Elevate legs to the level of the heart or above for 30 minutes daily and/or when sitting, a frequency of: - throughout the day Avoid standing for long periods of time. Exercise regularly Additional Orders / Instructions Follow Nutritious Diet Wound Treatment Wound #1 - Lower Leg Wound Laterality: Right, Medial Cleanser: Soap and Water 1 x Per Week/7 Days Discharge Instructions: May shower and wash wound with dial antibacterial soap and water prior to dressing change. Cleanser: Wound Cleanser (Generic) 1 x Per Week/7 Days Discharge Instructions: Cleanse the wound with wound cleanser prior to applying a clean dressing using gauze sponges, not tissue or cotton balls. Peri-Wound Care: Sween Lotion (Moisturizing lotion) 1 x Per Week/7 Days Discharge  Instructions: Apply moisturizing lotion as directed Prim Dressing: Hydrofera Blue Classic Foam, 4x4 in 1 x Per Week/7 Days ary Discharge Instructions: Moisten with saline prior to applying to wound bed. Apply over Santyl. Prim Dressing: Santyl Ointment 1 x Per Week/7 Days ary Discharge Instructions: Apply nickel thick amount to wound bed as instructed Secondary Dressing: Woven Gauze Sponge, Non-Sterile 4x4 in (Generic) 1 x Per Week/7 Days Discharge Instructions: Apply over primary dressing as directed. Secondary Dressing: ABD Pad, 5x9 1 x Per Week/7 Days Discharge Instructions: Apply over primary dressing as directed. Compression Wrap: Kerlix Roll 4.5x3.1 (in/yd) (Generic) 1 x Per Week/7 Days Discharge Instructions: Apply Kerlix and Coban compression as directed. Compression Wrap: Coban Self-Adherent Wrap 4x5 (in/yd) (Generic) 1 x Per Week/7 Days Discharge Instructions: Apply over Kerlix as directed. Wound #2 - T Great oe Wound Laterality: Right Cleanser: Soap and Water 1 x Per Day/7 Days Discharge Instructions: May shower and wash wound with dial antibacterial soap and water prior to dressing change. Prim Dressing: betadine 1 x Per Day/7 Days ary Discharge Instructions: apply daily Secondary Dressing: Woven Gauze Sponge, Non-Sterile 4x4 in 1 x Per Day/7 Days Discharge Instructions: Apply over primary dressing as directed. Secured With: 46M Medipore H Soft Cloth Surgical T 4 x 2 (in/yd) 1 x Per Day/7 Days ape Discharge Instructions: Secure dressing with tape as directed. Wound #3 - Lower Leg Wound Laterality: Right, Medial, Distal Cleanser: Soap and Water 1 x Per Week/7 Days Discharge Instructions: May shower and wash wound with dial antibacterial soap and water prior to dressing change. Cleanser: Wound Cleanser (Generic) 1 x Per Week/7 Days Discharge Instructions: Cleanse the wound with wound cleanser prior to applying a clean dressing using gauze sponges, not tissue or cotton  balls. Peri-Wound Care: Sween Lotion (Moisturizing lotion) 1 x Per Week/7 Days Discharge Instructions: Apply moisturizing lotion as directed Prim Dressing: KerraCel Ag Gelling Fiber Dressing, 2x2 in (silver alginate) 1 x Per Week/7 Days ary Discharge Instructions: lightly pack into undermining and wound bed as instructed Secondary Dressing: Woven Gauze Sponge, Non-Sterile 4x4 in (Generic) 1 x Per Week/7 Days Discharge Instructions: Apply over primary dressing as directed. Compression Wrap: Kerlix Roll 4.5x3.1 (in/yd) (Generic) 1 x Per Week/7 Days Discharge Instructions: Apply Kerlix and Coban compression as directed. Compression Wrap: Coban Self-Adherent Wrap 4x5 (in/yd) (Generic) 1 x Per Week/7 Days Discharge Instructions: Apply over Kerlix as directed. Patient Medications llergies: penicillin A Notifications Medication Indication Start End 01/09/2021 Bactrim DS DOSE 1 - oral 800 mg-160 mg tablet - 1 tablet twice daily for 7 days Electronic Signature(s) Signed: 01/09/2021 10:10:47 AM By: Kalman Shan DO Previous Signature: 01/09/2021 9:51:35 AM Version By: Kalman Shan DO Entered By: Kalman Shan on  01/09/2021 10:08:48 -------------------------------------------------------------------------------- Problem List Details Patient Name: Date of Service: [name], [name] 01/09/2021 8:30 A M Medical Record Number: [id number] Patient Account Number: [id number] Date of Birth/Sex: Treating RN: 12/31/1950 (70 y.o. 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 with unspecified 12/07/2020 No Yes severity L97.519 Non-pressure chronic  ulcer of other part of right foot with unspecified severity 12/07/2020 No Yes I73.9 Peripheral vascular disease, unspecified 12/07/2020 No Yes Inactive Problems ICD-10 Code Description Active Date Inactive Date T81.30XA Disruption of wound, unspecified, initial encounter 12/07/2020 12/07/2020 Resolved Problems Electronic Signature(s) Signed: 01/09/2021 10:10:47 AM By: Kalman Shan DO Entered By: Kalman Shan on 01/09/2021 09:35:24 -------------------------------------------------------------------------------- Progress Note Details Patient Name: Date of Service: Wyatt Loth. 01/09/2021 8:30 A M Medical Record Number: [id number] Patient Account Number: [id number] Date of Birth/Sex: Treating RN: [birth year] (70 y.o. [name] Primary Care Provider: [name] Other Clinician: Referring Provider: Treating Provider/Extender: Bartholome Bill in Treatment: 4 Subjective Chief Complaint Information obtained from Patient Surgical wound dehiscence of right lower extremity and Great toe wound. History of Present Illness (HPI) Admission 6/2 Mr. Shloma Roggenkamp is a 70 year old male with a past medical history of peripheral arterial disease status post right common femoral to peroneal bypass on 10/13/2020 and coil embolization of bypass fistula x2 on 11/01/2020. He presents today because he has wound dehiscence that occurred 3 weeks ago from his recent vascular surgery. He has been using wet-to-dry dressings to this area. He also has necrotic tissue to the right great toe. This was the original symptom that precipitated further vascular evaluation that led to his aortogram and ultimately bypass graft. He is currently keeping this area covered. He currently denies signs of infection. 6/6; patient presents for 1 week follow-up. He had Kerlix/Coban compression for the past week with Hydrofera Blue underneath. He reports tolerating this well without any issues. He thinks  the wound looks smaller today. He has been using Betadine to the toe wound. He has no complaints today. He denies signs of infection. 6/20; patient presents for 2-week follow-up. He had Kerlix/Coban compression with Hydrofera Blue. He has tolerated this well. He reports significant improvement in leg swelling. He also thinks the wound looks smaller. He has no complaints today and denies signs of infection. 6/27; patient presents for 1 week follow-up. He continues to use Hydrofera Blue under Kerlix/Coban compression and has tolerated this well. He continues to report improvement in leg swelling. He is content with the wound care progress at this time. He denies signs of infection. 7/5; patient presents for 1 week follow-up. He has been using Hydrofera Blue and Santyl under Kerlix/Coban. He reports tolerating this well. He has some mild tenderness to the distal portion of the right leg wound. He denies signs of infection. Patient History Information obtained from Patient. Family History Unknown History. Social History Never smoker, Marital Status - Married, Alcohol Use - Moderate, Drug Use - No History, Caffeine Use - Daily. Medical History Cardiovascular Patient has history of Coronary Artery Disease, Peripheral Arterial Disease, Peripheral Venous Disease Objective Constitutional respirations regular, non-labored and within target range for patient.. Vitals Time Taken: 8:49 AM, Height: 72 in, Weight: 162 lbs, BMI: 22, Temperature: 97.9 F, Pulse: 82 bpm, Respiratory  Rate: 16 breaths/min, Blood Pressure: 144/61 mmHg. Cardiovascular 2+ dorsalis pedis/posterior tibialis pulses. Psychiatric pleasant and cooperative. General Notes: Right lower extremity: 2+ pitting edema to the knee. Open wound to the medial aspect with nonviable tissue and granulation tissue present. No signs of infection. T the most distal part there Is an open wound with some depth and about 3 cm of undermining. Some  tenderness on exam and erythema. o Right foot: The right great toe has an eschar present. Integumentary (Hair, Skin) Wound #1 status is Open. Original cause of wound was Surgical Injury. The date acquired was: 11/16/2020. The wound has been in treatment 4 weeks. The wound is located on the Right,Medial Lower Leg. The wound measures 1.5cm length x 0.2cm width x 0.1cm depth; 0.236cm^2 area and 0.024cm^3 volume. There is Fat Layer (Subcutaneous Tissue) exposed. There is no tunneling or undermining noted. There is a small amount of serosanguineous drainage noted. The wound margin is distinct with the outline attached to the wound base. There is large (67-100%) red granulation within the wound bed. There is no necrotic tissue within the wound bed. Wound #2 status is Open. Original cause of wound was Gradually Appeared. The date acquired was: 09/18/2020. The wound has been in treatment 4 weeks. The wound is located on the Right T Great. The wound measures 1.5cm length x 1.6cm width x 0.1cm depth; 1.885cm^2 area and 0.188cm^3 volume. There is Fat oe Layer (Subcutaneous Tissue) exposed. There is no tunneling or undermining noted. There is a small amount of serous drainage noted. The wound margin is distinct with the outline attached to the wound base. There is no granulation within the wound bed. There is a large (67-100%) amount of necrotic tissue within the wound bed including Eschar. Wound #3 status is Open. Original cause of wound was Gradually Appeared. The date acquired was: 12/11/2020. The wound has been in treatment 4 weeks. The wound is located on the Right,Distal,Medial Lower Leg. The wound measures 0.7cm length x 0.5cm width x 0.8cm depth; 0.275cm^2 area and 0.22cm^3 volume. There is Fat Layer (Subcutaneous Tissue) exposed. There is no tunneling noted, however, there is undermining starting at 9:00 and ending at 3:00 with a maximum distance of 1.9cm. There is a medium amount of serosanguineous  drainage noted. The wound margin is distinct with the outline attached to the wound base. There is large (67-100%) red, pink granulation within the wound bed. There is a small (1-33%) amount of necrotic tissue within the wound bed including Adherent Slough. 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 The distal portion of the wound however has declined. At previous clinic visit there was a small scab with some serous drainage that I unroofed and discovered that there was a large wound underneath. T oday this has healthy granulation tissue present but does have some depth and undermining to it. I Would like to pack silver alginate better into this area. I will also send in a short course of antibiotics for possible soft tissue infection. I will see him back in a week Procedures Wound #1 Pre-procedure diagnosis of Wound #1 is a Dehisced Wound located on the Right,Medial Lower Leg . There was a  Excisional Skin/Subcutaneous Tissue Debridement with a total area of 0.45 sq cm performed by Kalman Shan, DO. With the following instrument(s): Curette to remove Viable and Non-Viable tissue/material. Material removed includes Subcutaneous Tissue, Slough, Skin: Dermis, Skin: Epidermis, and Fibrin/Exudate after achieving pain control using Lidocaine 4% T opical Solution. A time out was conducted at 09:20, prior to the start of the procedure. A Minimum amount of bleeding was controlled with Pressure. The procedure was tolerated well with a pain level of 0 throughout and a pain level of 0  following the procedure. Post Debridement Measurements: 1.5cm length x 0.2cm width x 0.2cm depth; 0.047cm^3 volume. Character of Wound/Ulcer Post Debridement requires further debridement. Post procedure Diagnosis Wound #1: Same as Pre-Procedure Wound #3 Pre-procedure diagnosis of Wound #3 is a Venous Leg Ulcer located on the Right,Distal,Medial Lower Leg .Severity of Tissue Pre Debridement is: Fat layer exposed. There was a Excisional Skin/Subcutaneous Tissue Debridement with a total area of 0.35 sq cm performed by Kalman Shan, DO. With the following instrument(s): Curette to remove Viable and Non-Viable tissue/material. Material removed includes Subcutaneous Tissue, Slough, Skin: Dermis, Skin: Epidermis, and Fibrin/Exudate after achieving pain control using Lidocaine 4% Topical Solution. A time out was conducted at 09:20, prior to the start of the procedure. A Minimum amount of bleeding was controlled with Pressure. The procedure was tolerated well with a pain level of 0 throughout and a pain level of 0 following the procedure. Post Debridement Measurements: 0.7cm length x 0.5cm width x 0.8cm depth; 0.22cm^3 volume. Character of Wound/Ulcer Post Debridement is stable. Severity of Tissue Post Debridement is: Fat layer exposed. Post procedure Diagnosis Wound #3: Same as Pre-Procedure Plan Follow-up Appointments: Return Appointment in 1 week. - with Dr. Heber Alden Monday Bathing/ Shower/ Hygiene: May shower with protection but do not get wound dressing(s) wet. Edema Control - Lymphedema / SCD / Other: Elevate legs to the level of the heart or above for 30 minutes daily and/or when sitting, a frequency of: - throughout the day Avoid standing for long periods of time. Exercise regularly Additional Orders / Instructions: Follow Nutritious Diet The following medication(s) was prescribed: Bactrim DS oral 800 mg-160 mg tablet 1 1 tablet twice daily for 7 days starting 01/09/2021 WOUND #1: - Lower  Leg Wound Laterality: Right, Medial Cleanser: Soap and Water 1 x Per Week/7 Days Discharge Instructions: May shower and wash wound with dial antibacterial soap and water prior to dressing change. Cleanser: Wound Cleanser (Generic) 1 x Per Week/7 Days Discharge Instructions: Cleanse the wound with wound cleanser prior to applying a clean dressing using gauze sponges, not tissue or cotton balls. Peri-Wound Care: Sween Lotion (Moisturizing lotion) 1 x Per Week/7 Days Discharge Instructions: Apply moisturizing lotion as directed Prim Dressing: Hydrofera Blue Classic Foam, 4x4 in 1 x Per Week/7 Days ary Discharge Instructions: Moisten with saline prior to applying to wound bed. Apply over Santyl. Prim Dressing: Santyl Ointment 1 x Per Week/7 Days ary Discharge Instructions: Apply nickel thick amount to wound bed as instructed Secondary Dressing: Woven Gauze Sponge, Non-Sterile 4x4 in (Generic) 1 x Per Week/7 Days Discharge Instructions: Apply over primary dressing as directed. Secondary Dressing: ABD Pad, 5x9 1 x Per Week/7 Days Discharge Instructions: Apply over primary dressing as directed. Com pression Wrap: Kerlix Roll 4.5x3.1 (in/yd) (Generic) 1 x Per Week/7 Days Discharge Instructions: Apply Kerlix and Coban compression as directed. Com pression Wrap: Coban Self-Adherent Wrap 4x5 (in/yd) (Generic) 1 x Per Week/7 Days Discharge Instructions: Apply over Kerlix as directed. WOUND #2: -  T Great Wound Laterality: Right oe Cleanser: Soap and Water 1 x Per Day/7 Days Discharge Instructions: May shower and wash wound with dial antibacterial soap and water prior to dressing change. Prim Dressing: betadine 1 x Per Day/7 Days ary Discharge Instructions: apply daily Secondary Dressing: Woven Gauze Sponge, Non-Sterile 4x4 in 1 x Per Day/7 Days Discharge Instructions: Apply over primary dressing as directed. Secured With: 39M Medipore H Soft Cloth Surgical T 4 x 2 (in/yd) 1 x Per Day/7  Days ape Discharge Instructions: Secure dressing with tape as directed. WOUND #3: - Lower Leg Wound Laterality: Right, Medial, Distal Cleanser: Soap and Water 1 x Per Week/7 Days Discharge Instructions: May shower and wash wound with dial antibacterial soap and water prior to dressing change. Cleanser: Wound Cleanser (Generic) 1 x Per Week/7 Days Discharge Instructions: Cleanse the wound with wound cleanser prior to applying a clean dressing using gauze sponges, not tissue or cotton balls. Peri-Wound Care: Sween Lotion (Moisturizing lotion) 1 x Per Week/7 Days Discharge Instructions: Apply moisturizing lotion as directed Prim Dressing: KerraCel Ag Gelling Fiber Dressing, 2x2 in (silver alginate) 1 x Per Week/7 Days ary Discharge Instructions: lightly pack into undermining and wound bed as instructed Secondary Dressing: Woven Gauze Sponge, Non-Sterile 4x4 in (Generic) 1 x Per Week/7 Days Discharge Instructions: Apply over primary dressing as directed. Com pression Wrap: Kerlix Roll 4.5x3.1 (in/yd) (Generic) 1 x Per Week/7 Days Discharge Instructions: Apply Kerlix and Coban compression as directed. Com pression Wrap: Coban Self-Adherent Wrap 4x5 (in/yd) (Generic) 1 x Per Week/7 Days Discharge Instructions: Apply over Kerlix as directed. 1. Bactrim 2. Silver alginate to the distal wound right leg wound 3. Hydrofera Blue and Santyl to the proximal anterior right leg wound 4. In office sharp debridement 5. Follow-up in 1 week Electronic Signature(s) Signed: 01/09/2021 10:10:47 AM By: Kalman Shan DO Entered By: Kalman Shan on 01/09/2021 10:10:14 -------------------------------------------------------------------------------- HxROS Details Patient Name: Date of Service: Wyatt Loth. 01/09/2021 8:30 A M Medical Record Number: [id number] Patient Account Number: [id number] Date of Birth/Sex: Treating RN: [birth year] (70 y.o. [name] Primary Care Provider: [name] Other Clinician: Referring Provider: Treating Provider/Extender: Bartholome Bill in Treatment: 4 Information Obtained From Patient Cardiovascular Medical History: Positive for: Coronary Artery Disease; Peripheral Arterial Disease; Peripheral Venous Disease Immunizations Pneumococcal Vaccine: Received Pneumococcal Vaccination: Yes Implantable Devices None Family and Social History Unknown History: Yes; Never smoker; Marital Status - Married; Alcohol Use: Moderate; Drug Use: No History; Caffeine Use: Daily; Financial Concerns: No; Food, Clothing or Shelter Needs: No; Support System Lacking: No; Transportation Concerns: No Electronic Signature(s) Signed: 01/09/2021 10:10:47 AM By: Kalman Shan DO Signed: 01/09/2021 6:55:28 PM By: Deon Pilling Entered By: Kalman Shan on 01/09/2021 09:36:49 -------------------------------------------------------------------------------- SuperBill Details Patient Name: Date of Service: Wyatt Loth 01/09/2021 Medical Record Number: [id number] Patient Account Number: [id number] Date of Birth/Sex: Treating RN: 25-Apr-1951 (70 y.o. 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 I73.9 Peripheral vascular disease, unspecified Facility Procedures CPT4 Code: [id number] 1 Description: 1042 - DEB SUBQ TISSUE 20 SQ CM/< ICD-10 Diagnosis Description 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 with unspecified se Modifier: verity Quantity:  1 Physician Procedures : CPT4 Code  Description Modifier [id number] - WC PHYS LEVEL 3 - EST PT ICD-10 Diagnosis Description 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 with unspecified severity T81.30XD Disruption of wound, unspecified, subsequent  encounter Quantity: 1 : [id number] - WC PHYS SUBQ TISS 20 SQ CM 1 ICD-10 Diagnosis Description 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 with unspecified severity Quantity: Electronic Signature(s) Signed: 01/09/2021 10:10:47 AM By: Kalman Shan DO Entered By: Kalman Shan on 01/09/2021 10:10:18

## 2021-01-15 ENCOUNTER — Encounter (HOSPITAL_BASED_OUTPATIENT_CLINIC_OR_DEPARTMENT_OTHER): Payer: PPO | Admitting: Internal Medicine

## 2021-01-15 ENCOUNTER — Other Ambulatory Visit: Payer: Self-pay

## 2021-01-15 DIAGNOSIS — T8130XD Disruption of wound, unspecified, subsequent encounter: Secondary | ICD-10-CM | POA: Diagnosis not present

## 2021-01-15 DIAGNOSIS — L97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 with unspecified severity: Secondary | ICD-10-CM | POA: Diagnosis not present

## 2021-01-15 DIAGNOSIS — R972 Elevated prostate specific antigen [PSA]: Secondary | ICD-10-CM | POA: Diagnosis not present

## 2021-01-15 NOTE — Progress Notes (Signed)
BRYNE, LINDON ([id number]) Visit Report for 01/15/2021 Arrival Information Details Patient Name: Date of Service: DONTARIOUS, SCHAUM 01/15/2021 9:45 A M Medical Record Number: [id number] Patient Account Number: [id number] Date of Birth/Sex: Treating RN: August 13, 1950 (70 y.o. [name], Lauren Primary Care Ellice Boultinghouse: [name] Other Clinician: Referring Kody Brandl: Treating Dakota Vanwart/Extender: Bartholome Bill in Treatment: 5 Visit Information History Since Last Visit Added or deleted any medications: No Patient Arrived: Ambulatory Any new allergies or adverse reactions: No Arrival Time: 10:24 Had a fall or experienced change in No Accompanied By: self activities of daily living that may affect Transfer Assistance: None risk of falls: Patient Identification Verified: Yes Signs or symptoms of abuse/neglect since last visito No Secondary Verification Process Completed: Yes Hospitalized since last visit: No Patient Requires Transmission-Based Precautions: No Implantable device outside of the clinic excluding No Patient Has Alerts: Yes cellular tissue based products placed in the center Patient Alerts: Patient on Blood Thinner since last visit: ABI R=0.95 Has Dressing in Place as Prescribed: Yes ABI L=1.21 Pain Present Now: No Electronic Signature(s) Signed: 01/15/2021 5:12:13 PM By: Rhae Hammock RN Entered By: Rhae Hammock on 01/15/2021 10:24:19 -------------------------------------------------------------------------------- Encounter Discharge Information Details Patient Name: Date of Service: [name] 01/15/2021 9:45 A M Medical Record Number: [id number] Patient Account Number: [id number] Date of Birth/Sex: Treating RN: 02-10-1951 (70 y.o. [name] Primary Care Sevag Shearn: [name] Other Clinician: Referring Iram Astorino: Treating Tamim Skog/Extender: Bartholome Bill in Treatment: 5 Encounter Discharge  Information Items Post Procedure Vitals Discharge Condition: Stable Temperature (F): 97.4 Ambulatory Status: Ambulatory Pulse (bpm): 88 Discharge Destination: Home Respiratory Rate (breaths/min): 17 Transportation: Private Auto Blood Pressure (mmHg): 142/82 Accompanied By: self Schedule Follow-up Appointment: Yes Clinical Summary of Care: Electronic Signature(s) Signed: 01/15/2021 5:17:45 PM By: Deon Pilling Entered By: Deon Pilling on 01/15/2021 11:52:45 -------------------------------------------------------------------------------- Lower Extremity Assessment Details Patient Name: Date of Service: JAKING, THAYER 01/15/2021 9:45 A M Medical Record Number: [id number] Patient Account Number: [id number] Date of Birth/Sex: Treating RN: [birth year] (70 y.o. [name], Lauren Primary Care Terri Malerba: [name] Other Clinician: Referring Tarrell Debes: Treating Saphira Lahmann/Extender: Vivia Budge Weeks in Treatment: 5 Edema Assessment Assessed: Shirlyn Goltz: No] Patrice Paradise: Yes] Edema: [Left: Ye] [Right: s] Calf Left: Right: Point of Measurement: From Medial Instep 38 cm Ankle Left: Right: Point of Measurement: From Medial Instep 25 cm Vascular Assessment Pulses: Dorsalis Pedis Palpable: [Right:Yes] Posterior Tibial Palpable: [Right:Yes] Electronic Signature(s) Signed: 01/15/2021 5:12:13 PM By: Rhae Hammock RN Entered By: Rhae Hammock on 01/15/2021 10:28:39 -------------------------------------------------------------------------------- Multi Wound Chart Details Patient Name: Date of Service: [name] 01/15/2021 9:45 A M Medical Record Number: [id number] Patient Account Number: [id number] Date of Birth/Sex: Treating RN: 04/26/1951 (70 y.o. [name] Primary Care Benancio Osmundson: [name] Other Clinician: Referring Latausha Flamm: Treating Tonita Bills/Extender: Bartholome Bill in Treatment: 5 Vital Signs Height(in):  72 Pulse(bpm): 89 Weight(lbs): 162 Blood Pressure(mmHg): 142/82 Body Mass Index(BMI): 22 Temperature(F): 97.4 Respiratory Rate(breaths/min): 17 Photos: [1:No Photos Right, Medial Lower Leg] [2:No Photos Right T Great oe] [3:No Photos Right, Distal, Medial Lower Leg] Wound Location: [1:Surgical Injury] [2:Gradually Appeared] [3:Gradually Appeared] Wounding Event: [1:Dehisced Wound] [2:Arterial Insufficiency Ulcer] [3:Venous Leg Ulcer] Primary Etiology: [1:Coronary Artery Disease, Peripheral] [2:Coronary Artery Disease, Peripheral] [3:Coronary Artery Disease, Peripheral] Comorbid History: [1:Arterial Disease, Peripheral Venous Disease 11/16/2020] [2:Arterial Disease, Peripheral Venous Disease 09/18/2020] [3:Arterial Disease, Peripheral Venous Disease 12/11/2020] Date Acquired: [1:5] [2:5] [3:5] Weeks of Treatment: [1:Open] [2:Open] [3:Open] Wound Status: [1:1.5x1.5x0.1] [2:1.5x1x0.3] [3:1.2x0.6x0.8]  Measurements L x W x D (cm) [1:1.767] [2:1.178] [3:0.565] A (cm) : rea [1:0.177] [2:0.353] [3:0.452] Volume (cm) : [1:67.90%] [2:32.10%] [3:-242.40%] % Reduction in Area: [1:92.00%] [2:32.20%] [3:-1269.70%] % Reduction in Volume: [1:Full Thickness Without Exposed] [2:Full Thickness With Exposed Support] [3:Full Thickness Without Exposed] Classification: [1:Support Structures Small] [2:Structures Small] [3:Support Structures Medium] Exudate A mount: [1:Serosanguineous] [2:Serous] [3:Serosanguineous] Exudate Type: [1:red, brown] [2:amber] [3:red, brown] Exudate Color: [1:No] [2:Yes] [3:No] Foul Odor A Cleansing: [1:fter [name]] [2:No] [3:[name]] Odor Anticipated Due to Product Use: [1:Distinct, outline attached] [2:Distinct, outline attached] [3:Distinct, outline attached] Wound Margin: [1:Large (67-100%)] [2:Small (1-33%)] [3:Large (67-100%)] Granulation A mount: [1:Red] [2:Red, Pink] [3:Red, Pink] Granulation Quality: [1:None Present (0%)] [2:Large (67-100%)] [3:Small (1-33%)] Necrotic Amount:  [1:[name]] [2:Eschar, Adherent Slough] [3:Adherent Slough] Necrotic Tissue: [1:Fat Layer (Subcutaneous Tissue): Yes Fat Layer (Subcutaneous Tissue): Yes Fat Layer (Subcutaneous Tissue): Yes] Exposed Structures: [1:Fascia: No Tendon: No Muscle: No Joint: No Bone: No Small (1-33%)] [2:Bone: Yes Fascia: No Tendon: No Muscle: No Joint: No None] [3:Fascia: No Tendon: No Muscle: No Joint: No Bone: No Small (1-33%)] Epithelialization: [1:Debridement - Selective/Open Wound [name]] [3:Debridement - Excisional] Debridement: Pre-procedure Verification/Time Out 10:53 [2:[name]] [3:10:53] Taken: [1:Other] [2:[name]] [3:Other] Pain Control: [1:[name]] [2:[name]] [3:Subcutaneous] Tissue Debrided: [1:Skin/Dermis] [2:[name]] [3:Skin/Subcutaneous Tissue] Level: [1:2.25] [2:[name]] [3:0.72] Debridement A (sq cm): [1:rea Curette] [2:[name]] [3:Curette] Instrument: [1:Minimum] [2:[name]] [3:Minimum] Bleeding: [1:Pressure] [2:[name]] [3:Pressure] Hemostasis A chieved: [1:Procedure was tolerated well] [2:[name]] [3:Procedure was tolerated well] Debridement Treatment Response: [1:1.5x1.5x0.1] [2:[name]] [3:1.2x0.6x0.8] Post Debridement Measurements L x W x D (cm) [1:0.177] [2:[name]] [3:0.452] Post Debridement Volume: (cm) [1:Debridement] [2:[name]] [3:Debridement] Treatment Notes Wound #1 (Lower Leg) Wound Laterality: Right, Medial Cleanser Soap and Water Discharge Instruction: May shower and wash wound with dial antibacterial soap and water prior to dressing change. Wound Cleanser Discharge Instruction: Cleanse the wound with wound cleanser prior to applying a clean dressing using gauze sponges, not tissue or cotton balls. Peri-Wound Care Sween Lotion (Moisturizing lotion) Discharge Instruction: Apply moisturizing lotion as directed Topical Primary Dressing Hydrofera Blue Classic Foam, 4x4 in Discharge Instruction: Moisten with saline prior to applying to wound bed. Apply over Santyl. Santyl Ointment Discharge Instruction: Apply nickel thick  amount to wound bed as instructed Secondary Dressing Woven Gauze Sponge, Non-Sterile 4x4 in Discharge Instruction: Apply over primary dressing as directed. ABD Pad, 5x9 Discharge Instruction: Apply over primary dressing as directed. Secured With Compression Wrap Kerlix Roll 4.5x3.1 (in/yd) Discharge Instruction: Apply Kerlix and Coban compression as directed. Coban Self-Adherent Wrap 4x5 (in/yd) Discharge Instruction: Apply over Kerlix as directed. Compression Stockings Add-Ons Wound #2 (Toe Great) Wound Laterality: Right Cleanser Soap and Water Discharge Instruction: May shower and wash wound with dial antibacterial soap and water prior to dressing change. Peri-Wound Care Topical Primary Dressing betadine Discharge Instruction: apply daily Secondary Dressing Woven Gauze Sponge, Non-Sterile 4x4 in Discharge Instruction: Apply over primary dressing as directed. Secured With 64M Medipore H Soft Cloth Surgical T 4 x 2 (in/yd) ape Discharge Instruction: Secure dressing with tape as directed. Compression Wrap Compression Stockings Add-Ons Wound #3 (Lower Leg) Wound Laterality: Right, Medial, Distal Cleanser Soap and Water Discharge Instruction: May shower and wash wound with dial antibacterial soap and water prior to dressing change. Wound Cleanser Discharge Instruction: Cleanse the wound with wound cleanser prior to applying a clean dressing using gauze sponges, not tissue or cotton balls. Peri-Wound Care Sween Lotion (Moisturizing lotion) Discharge Instruction: Apply moisturizing lotion as directed Topical Primary Dressing KerraCel Ag Gelling Fiber Dressing, 2x2 in (silver alginate) Discharge Instruction: lightly  pack into undermining and wound bed as instructed Secondary Dressing Woven Gauze Sponge, Non-Sterile 4x4 in Discharge Instruction: Apply over primary dressing as directed. Secured With Compression Wrap Kerlix Roll 4.5x3.1 (in/yd) Discharge Instruction: Apply  Kerlix and Coban compression as directed. Coban Self-Adherent Wrap 4x5 (in/yd) Discharge Instruction: Apply over Kerlix as directed. Compression Stockings Add-Ons Electronic Signature(s) Signed: 01/15/2021 1:04:22 PM By: Kalman Shan DO Signed: 01/15/2021 5:19:06 PM By: [name] Entered By: Kalman Shan on 01/15/2021 12:56:52 -------------------------------------------------------------------------------- Multi-Disciplinary Care Plan Details Patient Name: Date of Service: AERION, BAGDASARIAN 01/15/2021 9:45 A M Medical Record Number: [id number] Patient Account Number: [id number] Date of Birth/Sex: Treating RN: 04/21/1951 (70 y.o. [name] Primary Care Dallyn Bergland: [name] Other Clinician: Referring Olinda Nola: Treating Roscoe Witts/Extender: Bartholome Bill in Treatment: 5 Active Inactive Wound/Skin Impairment Nursing Diagnoses: Impaired tissue integrity Knowledge deficit related to ulceration/compromised skin integrity Goals: Patient will have a decrease in wound volume by X% from date: (specify in notes) Date Initiated: 12/07/2020 Target Resolution Date: 02/02/2021 Goal Status: Active Patient/caregiver will verbalize understanding of skin care regimen Date Initiated: 12/07/2020 Target Resolution Date: 02/01/2021 Goal Status: Active Ulcer/skin breakdown will have a volume reduction of 30% by week 4 Date Initiated: 12/07/2020 Target Resolution Date: 01/13/2021 Goal Status: Active Interventions: Assess patient/caregiver ability to obtain necessary supplies Assess patient/caregiver ability to perform ulcer/skin care regimen upon admission and as needed Assess ulceration(s) every visit Notes: Electronic Signature(s) Signed: 01/15/2021 5:19:06 PM By: [name] Entered By: [name] on 01/15/2021 10:52:03 -------------------------------------------------------------------------------- Pain Assessment Details Patient Name: Date of  Service: PRATYUSH, AMMON 01/15/2021 9:45 A M Medical Record Number: [id number] Patient Account Number: [id number] Date of Birth/Sex: Treating RN: September 30, 1950 (70 y.o. [name], Lauren Primary Care Klair Leising: [name] Other Clinician: Referring Twylia Oka: Treating Adream Parzych/Extender: Bartholome Bill in Treatment: 5 Active Problems Location of Pain Severity and Description of Pain Patient Has Paino No Site Locations Pain Management and Medication Current Pain Management: Electronic Signature(s) Signed: 01/15/2021 5:12:13 PM By: Rhae Hammock RN Entered By: Rhae Hammock on 01/15/2021 10:25:38 -------------------------------------------------------------------------------- Patient/Caregiver Education Details Patient Name: Date of Service: [name] 7/11/2022andnbsp9:45 [place] Record Number: [id number] Patient Account Number: [id number] Date of Birth/Gender: Treating RN: 01-Mar-1951 (70 y.o. [name] Primary Care Physician: [name] Other Clinician: Referring Physician: Treating Physician/Extender: Bartholome Bill in Treatment: 5 Education Assessment Education Provided To: Patient Education Topics Provided Offloading: Methods: Explain/Verbal, Printed Responses: State content correctly Venous: Methods: Explain/Verbal, Printed Responses: State content correctly Wound/Skin Impairment: Methods: Explain/Verbal, Printed Responses: State content correctly Electronic Signature(s) Signed: 01/15/2021 5:19:06 PM By: [name] Entered By: [name] on 01/15/2021 11:05:09 -------------------------------------------------------------------------------- Wound Assessment Details Patient Name: Date of Service: TRAVON, CROCHET 01/15/2021 9:45 A M Medical Record Number: [id number] Patient Account Number: [id number] Date of Birth/Sex: Treating RN: [birth year] (70 y.o. [name], Lauren Primary  Care Asanti Craigo: [name] Other Clinician: Referring Cozette Braggs: Treating Angelissa Supan/Extender: Vivia Budge Weeks in Treatment: 5 Wound Status Wound Number: 1 Primary Dehisced Wound Etiology: Wound Location: Right, Medial Lower Leg Wound Open Wounding Event: Surgical Injury Status: Date Acquired: 11/16/2020 Comorbid Coronary Artery Disease, Peripheral Arterial Disease, Weeks Of Treatment: 5 History: Peripheral Venous Disease Clustered Wound: No Wound Measurements Length: (cm) 1.5 Width: (cm) 1.5 Depth: (cm) 0.1 Area: (cm) 1.767 Volume: (cm) 0.177 % Reduction in Area: 67.9% % Reduction in Volume: 92% Epithelialization: Small (1-33%) Tunneling: No Undermining: No Wound Description Classification: Full Thickness Without  Exposed Support Structures Wound Margin: Distinct, outline attached Exudate Amount: Small Exudate Type: Serosanguineous Exudate Color: red, brown Foul Odor After Cleansing: No Slough/Fibrino Yes Wound Bed Granulation Amount: Large (67-100%) Exposed Structure Granulation Quality: Red Fascia Exposed: No Necrotic Amount: None Present (0%) Fat Layer (Subcutaneous Tissue) Exposed: Yes Tendon Exposed: No Muscle Exposed: No Joint Exposed: No Bone Exposed: No Treatment Notes Wound #1 (Lower Leg) Wound Laterality: Right, Medial Cleanser Soap and Water Discharge Instruction: May shower and wash wound with dial antibacterial soap and water prior to dressing change. Wound Cleanser Discharge Instruction: Cleanse the wound with wound cleanser prior to applying a clean dressing using gauze sponges, not tissue or cotton balls. Peri-Wound Care Sween Lotion (Moisturizing lotion) Discharge Instruction: Apply moisturizing lotion as directed Topical Primary Dressing Hydrofera Blue Classic Foam, 4x4 in Discharge Instruction: Moisten with saline prior to applying to wound bed. Apply over Santyl. Santyl Ointment Discharge Instruction: Apply  nickel thick amount to wound bed as instructed Secondary Dressing Woven Gauze Sponge, Non-Sterile 4x4 in Discharge Instruction: Apply over primary dressing as directed. ABD Pad, 5x9 Discharge Instruction: Apply over primary dressing as directed. Secured With Compression Wrap Kerlix Roll 4.5x3.1 (in/yd) Discharge Instruction: Apply Kerlix and Coban compression as directed. Coban Self-Adherent Wrap 4x5 (in/yd) Discharge Instruction: Apply over Kerlix as directed. Compression Stockings Add-Ons Electronic Signature(s) Signed: 01/15/2021 5:12:13 PM By: Rhae Hammock RN Entered By: Rhae Hammock on 01/15/2021 10:37:28 -------------------------------------------------------------------------------- Wound Assessment Details Patient Name: Date of Service: TAVARION, BABINGTON 01/15/2021 9:45 A M Medical Record Number: [id number] Patient Account Number: [id number] Date of Birth/Sex: Treating RN: [birth year] (70 y.o. [name], Lauren Primary Care Alyric Parkin: [name] Other Clinician: Referring Madhuri Vacca: Treating Latanya Hemmer/Extender: Vivia Budge Weeks in Treatment: 5 Wound Status Wound Number: 2 Primary Arterial Insufficiency Ulcer Etiology: Wound Location: Right T Great oe Wound Open Wounding Event: Gradually Appeared Status: Date Acquired: 09/18/2020 Comorbid Coronary Artery Disease, Peripheral Arterial Disease, Weeks Of Treatment: 5 History: Peripheral Venous Disease Clustered Wound: No Wound Measurements Length: (cm) 1.5 Width: (cm) 1 Depth: (cm) 0.3 Area: (cm) 1.178 Volume: (cm) 0.353 % Reduction in Area: 32.1% % Reduction in Volume: 32.2% Epithelialization: None Tunneling: No Undermining: No Wound Description Classification: Full Thickness With Exposed Support Structures Wound Margin: Distinct, outline attached Exudate Amount: Small Exudate Type: Serous Exudate Color: amber Foul Odor After Cleansing: Yes Due to Product Use:  No Slough/Fibrino Yes Wound Bed Granulation Amount: Small (1-33%) Exposed Structure Granulation Quality: Red, Pink Fascia Exposed: No Necrotic Amount: Large (67-100%) Fat Layer (Subcutaneous Tissue) Exposed: Yes Necrotic Quality: Eschar, Adherent Slough Tendon Exposed: No Muscle Exposed: No Joint Exposed: No Bone Exposed: Yes Treatment Notes Wound #2 (Toe Great) Wound Laterality: Right Cleanser Soap and Water Discharge Instruction: May shower and wash wound with dial antibacterial soap and water prior to dressing change. Peri-Wound Care Topical Primary Dressing betadine Discharge Instruction: apply daily Secondary Dressing Woven Gauze Sponge, Non-Sterile 4x4 in Discharge Instruction: Apply over primary dressing as directed. Secured With 41M Medipore H Soft Cloth Surgical T 4 x 2 (in/yd) ape Discharge Instruction: Secure dressing with tape as directed. Compression Wrap Compression Stockings Add-Ons Electronic Signature(s) Signed: 01/15/2021 5:12:13 PM By: Rhae Hammock RN Entered By: Rhae Hammock on 01/15/2021 10:37:04 -------------------------------------------------------------------------------- Wound Assessment Details Patient Name: Date of Service: EMMA, SCHUPP 01/15/2021 9:45 A M Medical Record Number: [id number] Patient Account Number: [id number] Date of Birth/Sex: Treating RN: [birth year] (70 y.o. Erie Noe Primary Care Pamla Pangle: [name] Other Clinician: Referring Janely Gullickson: Treating Lydiann Bonifas/Extender:  Vivia Budge Weeks in Treatment: 5 Wound Status Wound Number: 3 Primary Venous Leg Ulcer Etiology: Wound Location: Right, Distal, Medial Lower Leg Wound Open Wounding Event: Gradually Appeared Status: Date Acquired: 12/11/2020 Comorbid Coronary Artery Disease, Peripheral Arterial Disease, Weeks Of Treatment: 5 History: Peripheral Venous Disease Clustered Wound: No Wound Measurements Length: (cm) 1.2 Width:  (cm) 0.6 Depth: (cm) 0.8 Area: (cm) 0.565 Volume: (cm) 0.452 % Reduction in Area: -242.4% % Reduction in Volume: -1269.7% Epithelialization: Small (1-33%) Tunneling: No Undermining: No Wound Description Classification: Full Thickness Without Exposed Support Structures Wound Margin: Distinct, outline attached Exudate Amount: Medium Exudate Type: Serosanguineous Exudate Color: red, brown Foul Odor After Cleansing: No Slough/Fibrino No Wound Bed Granulation Amount: Large (67-100%) Exposed Structure Granulation Quality: Red, Pink Fascia Exposed: No Necrotic Amount: Small (1-33%) Fat Layer (Subcutaneous Tissue) Exposed: Yes Necrotic Quality: Adherent Slough Tendon Exposed: No Muscle Exposed: No Joint Exposed: No Bone Exposed: No Treatment Notes Wound #3 (Lower Leg) Wound Laterality: Right, Medial, Distal Cleanser Soap and Water Discharge Instruction: May shower and wash wound with dial antibacterial soap and water prior to dressing change. Wound Cleanser Discharge Instruction: Cleanse the wound with wound cleanser prior to applying a clean dressing using gauze sponges, not tissue or cotton balls. Peri-Wound Care Sween Lotion (Moisturizing lotion) Discharge Instruction: Apply moisturizing lotion as directed Topical Primary Dressing KerraCel Ag Gelling Fiber Dressing, 2x2 in (silver alginate) Discharge Instruction: lightly pack into undermining and wound bed as instructed Secondary Dressing Woven Gauze Sponge, Non-Sterile 4x4 in Discharge Instruction: Apply over primary dressing as directed. Secured With Compression Wrap Kerlix Roll 4.5x3.1 (in/yd) Discharge Instruction: Apply Kerlix and Coban compression as directed. Coban Self-Adherent Wrap 4x5 (in/yd) Discharge Instruction: Apply over Kerlix as directed. Compression Stockings Add-Ons Electronic Signature(s) Signed: 01/15/2021 5:12:13 PM By: Rhae Hammock RN Entered By: Rhae Hammock on 01/15/2021  10:36:21 -------------------------------------------------------------------------------- Vitals Details Patient Name: Date of Service: [name] 01/15/2021 9:45 A M Medical Record Number: [id number] Patient Account Number: [id number] Date of Birth/Sex: Treating RN: 1951-04-09 (70 y.o. [name], Lauren Primary Care Leeloo Silverthorne: [name] Other Clinician: Referring Haelee Bolen: Treating Sarahjane Matherly/Extender: Bartholome Bill in Treatment: 5 Vital Signs Time Taken: 10:24 Temperature (F): 97.4 Height (in): 72 Pulse (bpm): 88 Weight (lbs): 162 Respiratory Rate (breaths/min): 17 Body Mass Index (BMI): 22 Blood Pressure (mmHg): 142/82 Reference Range: 80 - 120 mg / dl Electronic Signature(s) Signed: 01/15/2021 5:12:13 PM By: Rhae Hammock RN Entered By: Rhae Hammock on 01/15/2021 10:25:31

## 2021-01-15 NOTE — Progress Notes (Signed)
GIOVONI, BUNCH ([id number]) Visit Report for 01/15/2021 Chief Complaint Document Details Patient Name: Date of Service: [name], [name] 01/15/2021 9:45 A M Medical Record Number: [id number] Patient Account Number: [id number] Date of Birth/Sex: Treating RN: 07-May-1951 (70 y.o. [name] Primary Care Provider: [name] Other Clinician: Referring Provider: Treating Provider/Extender: Bartholome Bill in Treatment: 5 Information Obtained from: Patient Chief Complaint Surgical wound dehiscence of right lower extremity and Great toe wound. Electronic Signature(s) Signed: 01/15/2021 1:04:22 PM By: Kalman Shan DO Entered By: Kalman Shan on 01/15/2021 12:57:16 -------------------------------------------------------------------------------- Debridement Details Patient Name: Date of Service: Wyatt Loth. 01/15/2021 9:45 A M Medical Record Number: [id number] Patient Account Number: [id number] Date of Birth/Sex: Treating RN: 03/30/1951 (70 y.o. [name] Primary Care Provider: [name] Other Clinician: Referring Provider: Treating Provider/Extender: Bartholome Bill in Treatment: 5 Debridement Performed for Assessment: Wound #1 Right,Medial Lower Leg Performed By: Physician Kalman Shan, DO Debridement Type: Debridement Level of Consciousness (Pre-procedure): Awake and Alert Pre-procedure Verification/Time Out Yes - 10:53 Taken: Start Time: 10:54 Pain Control: Other : Benzocaine T Area Debrided (L x W): otal 1.5 (cm) x 1.5 (cm) = 2.25 (cm) Tissue and other material debrided: Non-Viable, Skin: Dermis Level: Skin/Dermis Debridement Description: Selective/Open Wound Instrument: Curette Bleeding: Minimum Hemostasis Achieved: Pressure End Time: 10:56 Response to Treatment: Procedure was tolerated well Level of Consciousness (Post- Awake and Alert procedure): Post Debridement Measurements of Total  Wound Length: (cm) 1.5 Width: (cm) 1.5 Depth: (cm) 0.1 Volume: (cm) 0.177 Character of Wound/Ulcer Post Debridement: Stable Post Procedure Diagnosis Same as Pre-procedure Electronic Signature(s) Signed: 01/15/2021 1:04:22 PM By: Kalman Shan DO Signed: 01/15/2021 5:19:06 PM By: [name] Entered By: [name] on 01/15/2021 11:03:24 -------------------------------------------------------------------------------- Debridement Details Patient Name: Date of Service: Wyatt Loth. 01/15/2021 9:45 A M Medical Record Number: [id number] Patient Account Number: [id number] Date of Birth/Sex: Treating RN: [birth year] (70 y.o. [name] Primary Care Provider: [name] Other Clinician: Referring Provider: Treating Provider/Extender: Bartholome Bill in Treatment: 5 Debridement Performed for Assessment: Wound #3 Right,Distal,Medial Lower Leg Performed By: Physician Kalman Shan, DO Debridement Type: Debridement Severity of Tissue Pre Debridement: Fat layer exposed Level of Consciousness (Pre-procedure): Awake and Alert Pre-procedure Verification/Time Out Yes - 10:53 Taken: Start Time: 10:56 Pain Control: Other : Benzocaine T Area Debrided (L x W): otal 1.2 (cm) x 0.6 (cm) = 0.72 (cm) Tissue and other material debrided: Non-Viable, Subcutaneous Level: Skin/Subcutaneous Tissue Debridement Description: Excisional Instrument: Curette Bleeding: Minimum Hemostasis Achieved: Pressure End Time: 10:58 Response to Treatment: Procedure was tolerated well Level of Consciousness (Post- Awake and Alert procedure): Post Debridement Measurements of Total Wound Length: (cm) 1.2 Width: (cm) 0.6 Depth: (cm) 0.8 Volume: (cm) 0.452 Character of Wound/Ulcer Post Debridement: Stable Severity of Tissue Post Debridement: Fat layer exposed Post Procedure Diagnosis Same as Pre-procedure Electronic Signature(s) Signed: 01/15/2021 1:04:22 PM By:  Kalman Shan DO Signed: 01/15/2021 5:19:06 PM By: [name] Entered By: [name] on 01/15/2021 11:04:11 -------------------------------------------------------------------------------- HPI Details Patient Name: Date of Service: Wyatt Loth. 01/15/2021 9:45 A M Medical Record Number: [id number] Patient Account Number: [id number] Date of Birth/Sex: Treating RN: [birth year] (70 y.o. [name] Primary Care Provider: [name] Other Clinician: Referring Provider: Treating Provider/Extender: Bartholome Bill in Treatment: 5 History of Present Illness HPI Description: Admission 6/2 [name] is a 70 year old male with a past medical history of peripheral arterial disease status post right common  femoral to peroneal bypass on 10/13/2020 and coil embolization of bypass fistula x2 on 11/01/2020. He presents today because he has wound dehiscence that occurred 3 weeks ago from his recent vascular surgery. He has been using wet-to-dry dressings to this area. He also has necrotic tissue to the right great toe. This was the original symptom that precipitated further vascular evaluation that led to his aortogram and ultimately bypass graft. He is currently keeping this area covered. He currently denies signs of infection. 6/6; patient presents for 1 week follow-up. He had Kerlix/Coban compression for the past week with Hydrofera Blue underneath. He reports tolerating this well without any issues. He thinks the wound looks smaller today. He has been using Betadine to the toe wound. He has no complaints today. He denies signs of infection. 6/20; patient presents for 2-week follow-up. He had Kerlix/Coban compression with Hydrofera Blue. He has tolerated this well. He reports significant improvement in leg swelling. He also thinks the wound looks smaller. He has no complaints today and denies signs of infection. 6/27; patient presents for 1 week  follow-up. He continues to use Hydrofera Blue under Kerlix/Coban compression and has tolerated this well. He continues to report improvement in leg swelling. He is content with the wound care progress at this time. He denies signs of infection. 7/5; patient presents for 1 week follow-up. He has been using Hydrofera Blue and Santyl under Kerlix/Coban. He reports tolerating this well. He has some mild tenderness to the distal portion of the right leg wound. He denies signs of infection. 7/11; patient presents for 1 week follow-up. He has been using Hydrofera Blue and Santyl under Kerlix/Coban. He has no issues or complaints today. He was started on antibiotics at last clinic visit and reports improvement in tenderness and erythema to the wound. Electronic Signature(s) Signed: 01/15/2021 1:04:22 PM By: Kalman Shan DO Entered By: Kalman Shan on 01/15/2021 12:58:03 -------------------------------------------------------------------------------- Physical Exam Details Patient Name: Date of Service: ALBY, SCHWABE 01/15/2021 9:45 A M Medical Record Number: [id number] Patient Account Number: [id number] Date of Birth/Sex: Treating RN: 1950-12-30 (70 y.o. [name] Primary Care Provider: [name] Other Clinician: Referring Provider: Treating Provider/Extender: Bartholome Bill in Treatment: 5 Constitutional respirations regular, non-labored and within target range for patient.Marland Kitchen Psychiatric pleasant and cooperative. Notes Right lower extremity: 2+ pitting edema to the knee. Open wound to the medial aspect with nonviable tissue and granulation tissue present. No signs of infection. T the most distal part there Is an open wound with some depth and about 2 cm of undermining. Non viable tissue and granulation tissue present. o Right foot: The right great toe no longer has an eschar. There is non viable tissue and some granulation tissue present. Electronic  Signature(s) Signed: 01/15/2021 1:04:22 PM By: Kalman Shan DO Entered By: Kalman Shan on 01/15/2021 13:01:11 -------------------------------------------------------------------------------- Physician Orders Details Patient Name: Date of Service: DEMARRION, MEIKLEJOHN 01/15/2021 9:45 A M Medical Record Number: [id number] Patient Account Number: [id number] Date of Birth/Sex: Treating RN: 1951-05-09 (70 y.o. 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 -  with Dr. Heber [place] Return A Bathing/ Shower/ Hygiene May shower with protection but do not get wound dressing(s) wet. Edema Control - Lymphedema / SCD / Other Elevate legs to the level of the heart or above for 30 minutes daily and/or when sitting, a frequency of: - throughout the day Avoid standing for long periods of time. Exercise regularly Additional Orders / Instructions Follow Nutritious Diet Wound Treatment Wound #1 - Lower Leg Wound Laterality: Right, Medial Cleanser: Soap and Water 1 x Per Week/7 Days Discharge Instructions: May shower and wash wound with dial antibacterial soap and water prior to dressing change. Cleanser: Wound Cleanser (Generic) 1 x Per Week/7 Days Discharge Instructions: Cleanse the wound with wound cleanser prior to applying a clean dressing using gauze sponges, not tissue or cotton balls. Peri-Wound Care: Sween Lotion (Moisturizing lotion) 1 x Per Week/7  Days Discharge Instructions: Apply moisturizing lotion as directed Prim Dressing: Hydrofera Blue Classic Foam, 4x4 in 1 x Per Week/7 Days ary Discharge Instructions: Moisten with saline prior to applying to wound bed. Apply over Santyl. Prim Dressing: Santyl Ointment 1 x Per Week/7 Days ary Discharge Instructions: Apply nickel thick amount to wound bed as instructed Secondary Dressing: Woven Gauze Sponge, Non-Sterile 4x4 in (Generic) 1 x Per Week/7 Days Discharge Instructions: Apply over primary dressing as directed. Secondary Dressing: ABD Pad, 5x9 1 x Per Week/7 Days Discharge Instructions: Apply over primary dressing as directed. Compression Wrap: Kerlix Roll 4.5x3.1 (in/yd) (Generic) 1 x Per Week/7 Days Discharge Instructions: Apply Kerlix and Coban compression as directed. Compression Wrap: Coban Self-Adherent Wrap 4x5 (in/yd) (Generic) 1 x Per Week/7 Days Discharge Instructions: Apply over Kerlix as directed. Wound #2 - T Great oe Wound Laterality: Right Cleanser: Soap and Water 1 x Per Day/7 Days Discharge Instructions: May shower and wash wound with dial antibacterial soap and water prior to dressing change. Prim Dressing: betadine 1 x Per Day/7 Days ary Discharge Instructions: apply daily Secondary Dressing: Woven Gauze Sponge, Non-Sterile 4x4 in 1 x Per Day/7 Days Discharge Instructions: Apply over primary dressing as directed. Secured With: 67M Medipore H Soft Cloth Surgical T 4 x 2 (in/yd) 1 x Per Day/7 Days ape Discharge Instructions: Secure dressing with tape as directed. Wound #3 - Lower Leg Wound Laterality: Right, Medial, Distal Cleanser: Soap and Water 1 x Per Week/7 Days Discharge Instructions: May shower and wash wound with dial antibacterial soap and water prior to dressing change. Cleanser: Wound Cleanser (Generic) 1 x Per Week/7 Days Discharge Instructions: Cleanse the wound with wound cleanser prior to applying a clean dressing using gauze sponges, not tissue or  cotton balls. Peri-Wound Care: Sween Lotion (Moisturizing lotion) 1 x Per Week/7 Days Discharge Instructions: Apply moisturizing lotion as directed Prim Dressing: KerraCel Ag Gelling Fiber Dressing, 2x2 in (silver alginate) 1 x Per Week/7 Days ary Discharge Instructions: lightly pack into undermining and wound bed as instructed Secondary Dressing: Woven Gauze Sponge, Non-Sterile 4x4 in (Generic) 1 x Per Week/7 Days Discharge Instructions: Apply over primary dressing as directed. Compression Wrap: Kerlix Roll 4.5x3.1 (in/yd) (Generic) 1 x Per Week/7 Days Discharge Instructions: Apply Kerlix and Coban compression as directed. Compression Wrap: Coban Self-Adherent Wrap 4x5 (in/yd) (Generic) 1 x Per Week/7 Days Discharge Instructions: Apply over Kerlix as directed. Electronic Signature(s) Signed: 01/15/2021 1:04:22 PM By: Kalman Shan DO Entered By: Kalman Shan on 01/15/2021 13:01:30 -------------------------------------------------------------------------------- Problem List Details Patient Name: Date of Service: Wyatt Loth. 01/15/2021 9:45 A M Medical Record Number: [id number] Patient Account Number: [id number] Date  of Birth/Sex: Treating RN: [birth year] (70 y.o. 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 01/15/2021 9:45 A M Medical Record Number: [id number] Patient Account Number: [id number] Date of Birth/Sex: Treating RN: May 18, 1951 (70 y.o. [name] Primary Care Provider: [name] Other Clinician: Referring Provider: Treating Provider/Extender: Bartholome Bill in Treatment: 5 Subjective Chief Complaint Information obtained from Patient Surgical wound dehiscence of right lower extremity and Great toe wound. History of Present Illness (HPI) Admission 6/2 Mr. Vaibhav Fogleman is a 70 year old male with a past medical history of peripheral arterial disease status post right common femoral to peroneal bypass on 10/13/2020 and coil embolization of bypass fistula x2 on 11/01/2020. He presents today because he has wound dehiscence that occurred 3 weeks ago from his recent vascular surgery. He has been using wet-to-dry dressings to this area. He also has necrotic tissue to the right great toe. This was the original symptom that precipitated further vascular evaluation that led to his aortogram and ultimately bypass graft. He is currently keeping this area covered. He currently denies signs of infection. 6/6; patient presents for 1 week follow-up. He had Kerlix/Coban compression for the past week with Hydrofera Blue underneath. He reports tolerating this well without any issues. He thinks the wound looks smaller today. He has been using Betadine to the toe wound. He has no complaints today. He denies signs of infection. 6/20; patient presents for 2-week follow-up. He had Kerlix/Coban compression with Hydrofera Blue. He has  tolerated this well. He reports significant improvement in leg swelling. He also thinks the wound looks smaller. He has no complaints today and denies signs of infection. 6/27; patient presents for 1 week follow-up. He continues to use Hydrofera Blue under Kerlix/Coban compression and has tolerated this well. He continues to report improvement in leg swelling. He is content with the wound care progress at this time. He denies signs of infection. 7/5; patient presents for 1 week follow-up. He has been using Hydrofera Blue and Santyl under Kerlix/Coban. He reports tolerating this well. He has some mild tenderness to the distal portion of the right leg wound. He denies signs of infection. 7/11; patient presents for 1 week follow-up. He has been using Hydrofera Blue and Santyl under Kerlix/Coban. He has no issues or complaints today. He was started on antibiotics at last clinic visit and reports improvement in tenderness and erythema to the wound. Patient History Information obtained from Patient. Family History Unknown History. Social History Never smoker, Marital Status - Married, Alcohol Use - Moderate, Drug Use - No History, Caffeine Use - Daily. Medical History Cardiovascular Patient has history of Coronary Artery Disease, Peripheral Arterial Disease, Peripheral Venous Disease Objective Constitutional respirations regular, non-labored and within target range for patient.. Vitals Time Taken: 10:24 AM,  Height: 72 in, Weight: 162 lbs, BMI: 22, Temperature: 97.4 F, Pulse: 88 bpm, Respiratory Rate: 17 breaths/min, Blood Pressure: 142/82 mmHg. Psychiatric pleasant and cooperative. General Notes: Right lower extremity: 2+ pitting edema to the knee. Open wound to the medial aspect with nonviable tissue and granulation tissue present. No signs of infection. T the most distal part there Is an open wound with some depth and about 2 cm of undermining. Non viable tissue and granulation  tissue o present. Right foot: The right great toe no longer has an eschar. There is non viable tissue and some granulation tissue present. Integumentary (Hair, Skin) Wound #1 status is Open. Original cause of wound was Surgical Injury. The date acquired was: 11/16/2020. The wound has been in treatment 5 weeks. The wound is located on the Right,Medial Lower Leg. The wound measures 1.5cm length x 1.5cm width x 0.1cm depth; 1.767cm^2 area and 0.177cm^3 volume. There is Fat Layer (Subcutaneous Tissue) exposed. There is no tunneling or undermining noted. There is a small amount of serosanguineous drainage noted. The wound margin is distinct with the outline attached to the wound base. There is large (67-100%) red granulation within the wound bed. There is no necrotic tissue within the wound bed. Wound #2 status is Open. Original cause of wound was Gradually Appeared. The date acquired was: 09/18/2020. The wound has been in treatment 5 weeks. The wound is located on the Right T Great. The wound measures 1.5cm length x 1cm width x 0.3cm depth; 1.178cm^2 area and 0.353cm^3 volume. There is bone oe and Fat Layer (Subcutaneous Tissue) exposed. There is no tunneling or undermining noted. There is a small amount of serous drainage noted. Foul odor after cleansing was noted. The wound margin is distinct with the outline attached to the wound base. There is small (1-33%) red, pink granulation within the wound bed. There is a large (67-100%) amount of necrotic tissue within the wound bed including Eschar and Adherent Slough. Wound #3 status is Open. Original cause of wound was Gradually Appeared. The date acquired was: 12/11/2020. The wound has been in treatment 5 weeks. The wound is located on the Right,Distal,Medial Lower Leg. The wound measures 1.2cm length x 0.6cm width x 0.8cm depth; 0.565cm^2 area and 0.452cm^3 volume. There is Fat Layer (Subcutaneous Tissue) exposed. There is no tunneling or undermining noted.  There is a medium amount of serosanguineous drainage noted. The wound margin is distinct with the outline attached to the wound base. There is large (67-100%) red, pink granulation within the wound bed. There is a small (1-33%) amount of necrotic tissue within the wound bed including Adherent Slough. 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 I debrided nonviable tissue. No signs of infection. We will continue with the same dressings. For the toe wound I recommended continuing Betadine. I will avoid debriding this area due to his PAD. Overall looks healing though. Procedures Wound #1 Pre-procedure diagnosis of Wound #1 is a Dehisced Wound located on the Right,Medial Lower Leg . There was a Selective/Open Wound Skin/Dermis Debridement with a total area of 2.25 sq cm performed by Kalman Shan, DO. With the following instrument(s): Curette to remove Non-Viable tissue/material. Material removed includes Skin: Dermis after achieving pain control using Other (Benzocaine). No specimens were taken. A  time out was conducted at 10:53, prior to the start of the procedure. A Minimum amount of bleeding was controlled with Pressure. The procedure was tolerated well. Post Debridement Measurements: 1.5cm length x 1.5cm width x 0.1cm depth; 0.177cm^3 volume. Character of Wound/Ulcer Post Debridement is stable. Post procedure Diagnosis Wound #1: Same as Pre-Procedure Wound #3 Pre-procedure diagnosis of Wound #3 is a Venous Leg Ulcer located on the Right,Distal,Medial Lower Leg .Severity of Tissue Pre Debridement is: Fat layer exposed. There was a Excisional Skin/Subcutaneous Tissue  Debridement with a total area of 0.72 sq cm performed by Kalman Shan, DO. With the following instrument(s): Curette to remove Non-Viable tissue/material. Material removed includes Subcutaneous Tissue after achieving pain control using Other (Benzocaine). No specimens were taken. A time out was conducted at 10:53, prior to the start of the procedure. A Minimum amount of bleeding was controlled with Pressure. The procedure was tolerated well. Post Debridement Measurements: 1.2cm length x 0.6cm width x 0.8cm depth; 0.452cm^3 volume. Character of Wound/Ulcer Post Debridement is stable. Severity of Tissue Post Debridement is: Fat layer exposed. Post procedure Diagnosis Wound #3: Same as Pre-Procedure Plan Follow-up Appointments: Return Appointment in 1 week. - with Dr. Heber [place] Bathing/ Shower/ Hygiene: May shower with protection but do not get wound dressing(s) wet. Edema Control - Lymphedema / SCD / Other: Elevate legs to the level of the heart or above for 30 minutes daily and/or when sitting, a frequency of: - throughout the day Avoid standing for long periods of time. Exercise regularly Additional Orders / Instructions: Follow Nutritious Diet WOUND #1: - Lower Leg Wound Laterality: Right, Medial Cleanser: Soap and Water 1 x Per Week/7 Days Discharge Instructions: May shower and wash wound with dial antibacterial soap and water prior to dressing change. Cleanser: Wound Cleanser (Generic) 1 x Per Week/7 Days Discharge Instructions: Cleanse the wound with wound cleanser prior to applying a clean dressing using gauze sponges, not tissue or cotton balls. Peri-Wound Care: Sween Lotion (Moisturizing lotion) 1 x Per Week/7 Days Discharge Instructions: Apply moisturizing lotion as directed Prim Dressing: Hydrofera Blue Classic Foam, 4x4 in 1 x Per Week/7 Days ary Discharge Instructions: Moisten with saline prior to applying to wound bed. Apply over Santyl. Prim Dressing: Santyl Ointment 1 x Per  Week/7 Days ary Discharge Instructions: Apply nickel thick amount to wound bed as instructed Secondary Dressing: Woven Gauze Sponge, Non-Sterile 4x4 in (Generic) 1 x Per Week/7 Days Discharge Instructions: Apply over primary dressing as directed. Secondary Dressing: ABD Pad, 5x9 1 x Per Week/7 Days Discharge Instructions: Apply over primary dressing as directed. Com pression Wrap: Kerlix Roll 4.5x3.1 (in/yd) (Generic) 1 x Per Week/7 Days Discharge Instructions: Apply Kerlix and Coban compression as directed. Com pression Wrap: Coban Self-Adherent Wrap 4x5 (in/yd) (Generic) 1 x Per Week/7 Days Discharge Instructions: Apply over Kerlix as directed. WOUND #2: - T Great Wound Laterality: Right oe Cleanser: Soap and Water 1 x Per Day/7 Days Discharge Instructions: May shower and wash wound with dial antibacterial soap and water prior to dressing change. Prim Dressing: betadine 1 x Per Day/7 Days ary Discharge Instructions: apply daily Secondary Dressing: Woven Gauze Sponge, Non-Sterile 4x4 in 1 x Per Day/7 Days Discharge Instructions: Apply over primary dressing as directed. Secured With: 79M Medipore H Soft Cloth Surgical T 4 x 2 (in/yd) 1 x Per Day/7 Days ape Discharge Instructions: Secure dressing with tape as directed. WOUND #3: - Lower Leg Wound Laterality: Right, Medial, Distal Cleanser: Soap and Water 1 x Per Week/7 Days Discharge  Instructions: May shower and wash wound with dial antibacterial soap and water prior to dressing change. Cleanser: Wound Cleanser (Generic) 1 x Per Week/7 Days Discharge Instructions: Cleanse the wound with wound cleanser prior to applying a clean dressing using gauze sponges, not tissue or cotton balls. Peri-Wound Care: Sween Lotion (Moisturizing lotion) 1 x Per Week/7 Days Discharge Instructions: Apply moisturizing lotion as directed Prim Dressing: KerraCel Ag Gelling Fiber Dressing, 2x2 in (silver alginate) 1 x Per Week/7 Days ary Discharge Instructions:  lightly pack into undermining and wound bed as instructed Secondary Dressing: Woven Gauze Sponge, Non-Sterile 4x4 in (Generic) 1 x Per Week/7 Days Discharge Instructions: Apply over primary dressing as directed. Com pression Wrap: Kerlix Roll 4.5x3.1 (in/yd) (Generic) 1 x Per Week/7 Days Discharge Instructions: Apply Kerlix and Coban compression as directed. Com pression Wrap: Coban Self-Adherent Wrap 4x5 (in/yd) (Generic) 1 x Per Week/7 Days Discharge Instructions: Apply over Kerlix as directed. 1. In office sharp debridement 2. Hydrofera Blue and Santyl to the proximal right leg wound 3. Silver alginate to the distal right leg wound 4. Kerlix/Coban 5. Follow-up in 1 week Electronic Signature(s) Signed: 01/15/2021 1:04:22 PM By: Kalman Shan DO Entered By: Kalman Shan on 01/15/2021 13:03:54 -------------------------------------------------------------------------------- HxROS Details Patient Name: Date of Service: Wyatt Loth. 01/15/2021 9:45 A M Medical Record Number: [id number] Patient Account Number: [id number] Date of Birth/Sex: Treating RN: 07-25-1950 (71 y.o. [name] Primary Care Provider: [name] Other Clinician: Referring Provider: Treating Provider/Extender: Bartholome Bill in Treatment: 5 Information Obtained From Patient Cardiovascular Medical History: Positive for: Coronary Artery Disease; Peripheral Arterial Disease; Peripheral Venous Disease Immunizations Pneumococcal Vaccine: Received Pneumococcal Vaccination: Yes Implantable Devices None Family and Social History Unknown History: Yes; Never smoker; Marital Status - Married; Alcohol Use: Moderate; Drug Use: No History; Caffeine Use: Daily; Financial Concerns: No; Food, Clothing or Shelter Needs: No; Support System Lacking: No; Transportation Concerns: No Electronic Signature(s) Signed: 01/15/2021 1:04:22 PM By: Kalman Shan DO Signed: 01/15/2021 5:19:06 PM  By: [name] Entered By: Kalman Shan on 01/15/2021 12:58:10 -------------------------------------------------------------------------------- SuperBill Details Patient Name: Date of Service: Wyatt Loth 01/15/2021 Medical Record Number: [id number] Patient Account Number: [id number] Date of Birth/Sex: Treating RN: 21-Mar-1951 (70 y.o. 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 I73.9 Peripheral vascular disease, unspecified Facility Procedures CPT4 Code: [id number] Description: 62836 - DEB SUBQ TISSUE 20 SQ CM/< ICD-10 Diagnosis Description 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 with unspecified seve Modifier: rity Quantity: 1 CPT4 Code: [id number] Description: 65035 - DEBRIDE WOUND 1ST 20 SQ CM OR < ICD-10 Diagnosis Description 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 with unspecified seve Modifier: rity Quantity: 1 Physician Procedures : CPT4 Code Description Modifier [id number] 11042 - WC PHYS SUBQ TISS 20 SQ CM ICD-10 Diagnosis Description 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 with unspecified severity Quantity: 1 : [id number] 74944 - WC PHYS DEBR WO ANESTH 20 SQ CM ICD-10 Diagnosis Description 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 with unspecified severity Quantity: 1 Electronic Signature(s) Signed: 01/15/2021 1:04:22 PM By: Kalman Shan DO Entered By: Heber Mapleton,  Lurlene Ronda on 01/15/2021 13:03:59

## 2021-01-22 ENCOUNTER — Encounter (HOSPITAL_BASED_OUTPATIENT_CLINIC_OR_DEPARTMENT_OTHER): Payer: PPO | Admitting: Internal Medicine

## 2021-01-25 ENCOUNTER — Other Ambulatory Visit: Payer: Self-pay

## 2021-01-25 ENCOUNTER — Encounter (HOSPITAL_BASED_OUTPATIENT_CLINIC_OR_DEPARTMENT_OTHER): Payer: PPO | Admitting: Internal Medicine

## 2021-01-25 DIAGNOSIS — L97519 Non-pressure chronic ulcer of other part of right foot with unspecified severity: Secondary | ICD-10-CM

## 2021-01-25 DIAGNOSIS — T8130XD Disruption of wound, unspecified, subsequent encounter: Secondary | ICD-10-CM | POA: Diagnosis not present

## 2021-01-25 NOTE — Progress Notes (Signed)
[name], [name] ([id number]) Visit Report for 01/25/2021 Chief Complaint Document Details Patient Name: Date of Service: [name], [name] 01/25/2021 7:30 A M Medical Record Number: [id number] Patient Account Number: [id number] Date of Birth/Sex: Treating RN: 28-May-1951 (70 y.o. Erie Noe Primary Care Provider: [name] Other Clinician: Referring Provider: Treating Provider/Extender: Bartholome Bill in Treatment: 7 Information Obtained from: Patient Chief Complaint Surgical wound dehiscence of right lower extremity and Great toe wound. Electronic Signature(s) Signed: 01/25/2021 8:30:08 AM By: Kalman Shan DO Entered By: Kalman Shan on 01/25/2021 08:20:08 -------------------------------------------------------------------------------- Debridement Details Patient Name: Date of Service: Wyatt Loth. 01/25/2021 7:30 A M Medical Record Number: [id number] Patient Account Number: [id number] Date of Birth/Sex: Treating RN: October 02, 1950 (70 y.o. [name], Lauren Primary Care Provider: [name] Other Clinician: Referring Provider: Treating Provider/Extender: Bartholome Bill in Treatment: 7 Debridement Performed for Assessment: Wound #3 Right,Distal,Medial Lower Leg Performed By: Physician Kalman Shan, DO Debridement Type: Debridement Severity of Tissue Pre Debridement: Fat layer exposed Level of Consciousness (Pre-procedure): Awake and Alert Pre-procedure Verification/Time Out Yes - 08:15 Taken: Start Time: 08:15 Pain Control: Lidocaine T Area Debrided (L x W): otal 0.6 (cm) x 0.4 (cm) = 0.24 (cm) Tissue and other material debrided: Viable, Non-Viable, Slough, Subcutaneous, Skin: Dermis , Skin: Epidermis, Slough Level: Skin/Subcutaneous Tissue Debridement Description: Excisional Instrument: Curette Bleeding: Minimum Hemostasis Achieved: Calcium Alginate End Time: 08:15 Procedural Pain: 0 Post  Procedural Pain: 0 Response to Treatment: Procedure was tolerated well Level of Consciousness (Post- Awake and Alert procedure): Post Debridement Measurements of Total Wound Length: (cm) 0.6 Width: (cm) 0.4 Depth: (cm) 0.5 Volume: (cm) 0.094 Character of Wound/Ulcer Post Debridement: Improved Severity of Tissue Post Debridement: Fat layer exposed Post Procedure Diagnosis Same as Pre-procedure Electronic Signature(s) Signed: 01/25/2021 8:30:08 AM By: Kalman Shan DO Signed: 01/25/2021 7:48:44 PM By: Rhae Hammock RN Entered By: Rhae Hammock on 01/25/2021 08:16:51 -------------------------------------------------------------------------------- HPI Details Patient Name: Date of Service: Wyatt Loth. 01/25/2021 7:30 A M Medical Record Number: [id number] Patient Account Number: [id number] Date of Birth/Sex: Treating RN: [birth year] (70 y.o. Erie Noe Primary Care Provider: [name] Other Clinician: Referring Provider: Treating Provider/Extender: Bartholome Bill in Treatment: 7 History of Present Illness HPI Description: Admission 6/2 [name] is a 70 year old male with a past medical history of peripheral arterial disease status post right common femoral to peroneal bypass on 10/13/2020 and coil embolization of bypass fistula x2 on 11/01/2020. He presents today because he has wound dehiscence that occurred 3 weeks ago from his recent vascular surgery. He has been using wet-to-dry dressings to this area. He also has necrotic tissue to the right great toe. This was the original symptom that precipitated further vascular evaluation that led to his aortogram and ultimately bypass graft. He is currently keeping this area covered. He currently denies signs of infection. 6/6; patient presents for 1 week follow-up. He had Kerlix/Coban compression for the past week with Hydrofera Blue underneath. He reports tolerating this well without  any issues. He thinks the wound looks smaller today. He has been using Betadine to the toe wound. He has no complaints today. He denies signs of infection. 6/20; patient presents for 2-week follow-up. He had Kerlix/Coban compression with Hydrofera Blue. He has tolerated this well. He reports significant improvement in leg swelling. He also thinks the wound looks smaller. He has no complaints today and denies signs of infection. 6/27; patient presents for 1 week follow-up. He  continues to use Hydrofera Blue under Kerlix/Coban compression and has tolerated this well. He continues to report improvement in leg swelling. He is content with the wound care progress at this time. He denies signs of infection. 7/5; patient presents for 1 week follow-up. He has been using Hydrofera Blue and Santyl under Kerlix/Coban. He reports tolerating this well. He has some mild tenderness to the distal portion of the right leg wound. He denies signs of infection. 7/11; patient presents for 1 week follow-up. He has been using Hydrofera Blue and Santyl under Kerlix/Coban. He has no issues or complaints today. He was started on antibiotics at last clinic visit and reports improvement in tenderness and erythema to the wound. 7/21; patient presents for 1 week follow-up. He has been using Hydrofera Blue and Santyl, silver alginate under Kerlix/Coban. He has no issues or complaints today. He reports improvement T his wound healing. Unfortunately he hit his right great toe and split the callus open a little bit. He denies signs of infection. o Electronic Signature(s) Signed: 01/25/2021 8:30:08 AM By: Kalman Shan DO Entered By: Kalman Shan on 01/25/2021 08:22:42 -------------------------------------------------------------------------------- Physical Exam Details Patient Name: Date of Service: [name], [name] 01/25/2021 7:30 A M Medical Record Number: [id number] Patient Account Number: [id number] Date of Birth/Sex:  Treating RN: [birth year] (70 y.o. Erie Noe Primary Care Provider: [name] Other Clinician: Referring Provider: Treating Provider/Extender: Vivia Budge Weeks in Treatment: 7 Constitutional respirations regular, non-labored and within target range for patient.. Cardiovascular 2+ dorsalis pedis/posterior tibialis pulses. Psychiatric pleasant and cooperative. Notes Right lower extremity: Good edema control. Open wound to the distal portion of the anterior shin with Granulation tissue and nonviable tissue present. Proximal wound has epithelialized. Right foot: The right great toe has callused. No signs of infection Electronic Signature(s) Signed: 01/25/2021 8:30:08 AM By: Kalman Shan DO Entered By: Kalman Shan on 01/25/2021 08:25:03 -------------------------------------------------------------------------------- Physician Orders Details Patient Name: Date of Service: Wyatt Loth. 01/25/2021 7:30 A M Medical Record Number: [id number] Patient Account Number: [id number] Date of Birth/Sex: Treating RN: 09/24/1950 (70 y.o. 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 - with Dr. Heber [place] on Friday Return A Nurse Visit: - on Tuesday August 9th but needs a later time than 1230 d/t surgeon appt. Bathing/ Shower/ Hygiene May shower with protection but do not get wound dressing(s) wet. Edema Control -  Lymphedema / SCD / Other Elevate legs to the level of the heart or above for 30 minutes daily and/or when sitting, a frequency of: - throughout the day Avoid standing for long periods of time. Exercise regularly Additional Orders / Instructions Follow Nutritious Diet Non Wound Condition Other Non Wound Condition Orders/Instructions: - Put small piece of CaAlgAg over healed area on Right Ant. LEG Wound Treatment Wound #2 - T Great oe Wound Laterality: Right Cleanser: Soap and Water 1 x Per Day/7 Days Discharge Instructions: May shower and wash wound with dial antibacterial soap and water prior to dressing change. Prim Dressing: betadine 1 x Per Day/7 Days ary Discharge Instructions: apply daily Secondary Dressing: Woven Gauze Sponge,  Non-Sterile 4x4 in 1 x Per Day/7 Days Discharge Instructions: Apply over primary dressing as directed. Secured With: 7M Medipore H Soft Cloth Surgical T 4 x 2 (in/yd) 1 x Per Day/7 Days ape Discharge Instructions: Secure dressing with tape as directed. Wound #3 - Lower Leg Wound Laterality: Right, Medial, Distal Cleanser: Soap and Water 1 x Per Week/7 Days Discharge Instructions: May shower and wash wound with dial antibacterial soap and water prior to dressing change. Cleanser: Wound Cleanser (Generic) 1 x Per Week/7 Days Discharge Instructions: Cleanse the wound with wound cleanser prior to applying a clean dressing using gauze sponges, not tissue or cotton balls. Peri-Wound Care: Sween Lotion (Moisturizing lotion) 1 x Per Week/7 Days Discharge Instructions: Apply moisturizing lotion as directed Prim Dressing: KerraCel Ag Gelling Fiber Dressing, 2x2 in (silver alginate) 1 x Per Week/7 Days ary Discharge Instructions: lightly pack into undermining and wound bed as instructed Secondary Dressing: Woven Gauze Sponge, Non-Sterile 4x4 in (Generic) 1 x Per Week/7 Days Discharge Instructions: Apply over primary dressing as directed. Compression Wrap: Kerlix  Roll 4.5x3.1 (in/yd) (Generic) 1 x Per Week/7 Days Discharge Instructions: Apply Kerlix and Coban compression as directed. Compression Wrap: Coban Self-Adherent Wrap 4x5 (in/yd) (Generic) 1 x Per Week/7 Days Discharge Instructions: Apply over Kerlix as directed. Electronic Signature(s) Signed: 01/25/2021 8:30:08 AM By: Kalman Shan DO Entered By: Kalman Shan on 01/25/2021 08:25:42 -------------------------------------------------------------------------------- Problem List Details Patient Name: Date of Service: Wyatt Loth. 01/25/2021 7:30 A M Medical Record Number: [id number] Patient Account Number: [id number] Date of Birth/Sex: Treating RN: 1950/08/31 (70 y.o. 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 01/25/2021 7:30 A M Medical Record Number: [id number] Patient Account Number: [id number] Date of Birth/Sex: Treating RN: 1950-10-12 (70 y.o. Erie Noe Primary Care Provider: [name] Other Clinician: Referring Provider: Treating Provider/Extender: Bartholome Bill in Treatment: 7 Subjective Chief Complaint Information obtained from Patient Surgical wound dehiscence of right lower extremity and Great toe wound. History of Present Illness (HPI) Admission 6/2 [name] is a 70 year old male with a past medical history of peripheral arterial disease status post right common femoral to peroneal bypass on 10/13/2020 and coil embolization of bypass fistula x2 on 11/01/2020. He presents today because he has wound dehiscence that occurred 3 weeks ago from his recent vascular surgery. He has been using wet-to-dry dressings to this area. He also has necrotic tissue to the right great toe. This was the original symptom that precipitated further vascular evaluation that led to his aortogram and ultimately bypass graft. He is currently keeping this area covered. He currently denies signs of infection. 6/6; patient presents for 1 week follow-up. He had Kerlix/Coban compression for the past week with Hydrofera Blue underneath. He reports tolerating this well without  any issues. He thinks the wound looks smaller today. He has been using Betadine to the toe wound. He has no complaints today. He denies signs of infection. 6/20; patient presents for 2-week follow-up. He had Kerlix/Coban compression with Hydrofera Blue. He has tolerated this well. He reports significant improvement in leg swelling. He also thinks the wound looks smaller. He has no complaints today and denies signs of infection. 6/27; patient presents for 1 week follow-up. He continues to use Hydrofera Blue under Kerlix/Coban compression and has tolerated this well. He continues to report improvement in leg swelling. He is content with the wound care progress at this time. He denies signs of infection. 7/5; patient presents for 1 week follow-up. He has been  using Hydrofera Blue and Santyl under Kerlix/Coban. He reports tolerating this well. He has some mild tenderness to the distal portion of the right leg wound. He denies signs of infection. 7/11; patient presents for 1 week follow-up. He has been using Hydrofera Blue and Santyl under Kerlix/Coban. He has no issues or complaints today. He was started on antibiotics at last clinic visit and reports improvement in tenderness and erythema to the wound. 7/21; patient presents for 1 week follow-up. He has been using Hydrofera Blue and Santyl, silver alginate under Kerlix/Coban. He has no issues or complaints today. He reports improvement T his wound healing. Unfortunately he hit his right great toe and split the callus open a little bit. He denies signs of infection. o Patient History Information obtained from Patient. Family History Unknown History. Social History Never smoker, Marital Status - Married, Alcohol Use - Moderate, Drug Use - No History, Caffeine Use - Daily. Medical History Cardiovascular Patient has history of Coronary Artery Disease, Peripheral Arterial Disease, Peripheral Venous Disease Objective Constitutional respirations regular, non-labored and within target range for patient.. Vitals Time Taken: 7:48 AM, Height: 72 in, Weight: 162 lbs, BMI: 22, Temperature: 97.4 F, Pulse: 83 bpm, Respiratory Rate: 16 breaths/min, Blood Pressure: 117/79 mmHg. Cardiovascular 2+ dorsalis pedis/posterior tibialis pulses. Psychiatric pleasant and cooperative. General Notes: Right lower extremity: Good edema control. Open wound to the distal portion of the anterior shin with Granulation tissue and nonviable tissue present. Proximal wound has epithelialized. Right foot: The right great toe has callused. No signs of infection Integumentary (Hair, Skin) Wound #1 status is Healed - Epithelialized. Original cause of wound was Surgical Injury. The date acquired was: 11/16/2020. The wound has been in  treatment 7 weeks. The wound is located on the Right,Medial Lower Leg. The wound measures 0cm length x 0cm width x 0cm depth; 0cm^2 area and 0cm^3 volume. Wound #2 status is Open. Original cause of wound was Gradually Appeared. The date acquired was: 09/18/2020. The wound has been in treatment 7 weeks. The wound is located on the Right T Great. The wound measures 0.2cm length x 0.2cm width x 0.1cm depth; 0.031cm^2 area and 0.003cm^3 volume. There is oe bone and Fat Layer (Subcutaneous Tissue) exposed. There is no tunneling or undermining noted. There is a small amount of serous drainage noted. The wound margin is distinct with the outline attached to the wound base. There is large (67-100%) red, pink granulation within the wound bed. There is a small (1-33%) amount of necrotic tissue within the wound bed including Eschar. General Notes: Calloused periwound Wound #3 status is Open. Original cause of wound was Gradually Appeared. The date acquired was: 12/11/2020. The wound has been in treatment 6 weeks. The wound is located on the Right,Distal,Medial Lower Leg. The  wound measures 0.6cm length x 0.4cm width x 0.5cm depth; 0.188cm^2 area and 0.094cm^3 volume. There is Fat Layer (Subcutaneous Tissue) exposed. There is no tunneling or undermining noted. There is a medium amount of serosanguineous drainage noted. The wound margin is distinct with the outline attached to the wound base. There is large (67-100%) red, pink granulation within the wound bed. There is no necrotic tissue within the wound bed. 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 The proximal right leg  wound has healed. The distal portion appears well-healing. I debrided nonviable tissue. No signs of infection. I recommended continuing to use silver alginate under Kerlix/Coban to the distal wound. For the right great toe I recommended continuing Betadine. Procedures Wound #3 Pre-procedure diagnosis of Wound #3 is a Venous Leg Ulcer located on the Right,Distal,Medial Lower Leg .Severity of Tissue Pre Debridement is: Fat layer exposed. There was a Excisional Skin/Subcutaneous Tissue Debridement with a total area of 0.24 sq cm performed by Kalman Shan, DO. With the following instrument(s): Curette to remove Viable and Non-Viable tissue/material. Material removed includes Subcutaneous Tissue, Slough, Skin: Dermis, and Skin: Epidermis after achieving pain control using Lidocaine. No specimens were taken. A time out was conducted at 08:15, prior to the start of the procedure. A Minimum amount of bleeding was controlled with Calcium Alginate. The procedure was tolerated well with a pain level of 0 throughout and a pain level of 0 following the procedure. Post Debridement Measurements: 0.6cm length x 0.4cm width x 0.5cm depth; 0.094cm^3 volume. Character of Wound/Ulcer Post Debridement is improved. Severity of Tissue Post Debridement is: Fat layer exposed. Post procedure Diagnosis Wound #3: Same as Pre-Procedure Plan Follow-up Appointments: Return Appointment in 1 week. - with Dr. Heber [place] on Friday Nurse Visit: - on Tuesday August 9th but needs a later time than 1230 d/t surgeon appt. Bathing/ Shower/ Hygiene: May shower with protection but do not get wound dressing(s) wet. Edema Control - Lymphedema / SCD / Other: Elevate legs to the level of the heart or above for 30 minutes daily and/or when sitting, a frequency of: - throughout the day Avoid standing for long periods of time. Exercise regularly Additional Orders / Instructions: Follow Nutritious Diet Non Wound Condition: Other Non Wound  Condition Orders/Instructions: - Put small piece of CaAlgAg over healed area on Right Ant. LEG WOUND #2: - T Great Wound Laterality: Right oe Cleanser: Soap and Water 1 x Per Day/7 Days Discharge Instructions: May shower and wash wound with dial antibacterial soap and water prior to dressing change. Prim Dressing: betadine 1 x Per Day/7 Days ary Discharge Instructions: apply daily Secondary Dressing: Woven Gauze Sponge, Non-Sterile 4x4 in 1 x Per Day/7 Days Discharge Instructions: Apply over primary dressing as directed. Secured With: 36M Medipore H Soft Cloth Surgical T 4 x 2 (in/yd) 1 x Per Day/7 Days ape Discharge Instructions: Secure dressing with tape as directed. WOUND #3: - Lower Leg Wound Laterality: Right, Medial, Distal Cleanser: Soap and Water 1 x Per Week/7 Days Discharge Instructions: May shower and wash wound with dial antibacterial soap and water prior to dressing change. Cleanser: Wound Cleanser (Generic) 1 x Per Week/7 Days Discharge Instructions: Cleanse the wound with wound cleanser prior to applying a clean dressing using gauze  sponges, not tissue or cotton balls. Peri-Wound Care: Sween Lotion (Moisturizing lotion) 1 x Per Week/7 Days Discharge Instructions: Apply moisturizing lotion as directed Prim Dressing: KerraCel Ag Gelling Fiber Dressing, 2x2 in (silver alginate) 1 x Per Week/7 Days ary Discharge Instructions: lightly pack into undermining and wound bed as instructed Secondary Dressing: Woven Gauze Sponge, Non-Sterile 4x4 in (Generic) 1 x Per Week/7 Days Discharge Instructions: Apply over primary dressing as directed. Com pression Wrap: Kerlix Roll 4.5x3.1 (in/yd) (Generic) 1 x Per Week/7 Days Discharge Instructions: Apply Kerlix and Coban compression as directed. Com pression Wrap: Coban Self-Adherent Wrap 4x5 (in/yd) (Generic) 1 x Per Week/7 Days Discharge Instructions: Apply over Kerlix as directed. 1. Silver alginate under Kerlix/Coban 2. Betadine to the  right great toe 3. Follow-up in 1 week Electronic Signature(s) Signed: 01/25/2021 8:30:08 AM By: Kalman Shan DO Entered By: Kalman Shan on 01/25/2021 08:29:40 -------------------------------------------------------------------------------- HxROS Details Patient Name: Date of Service: Wyatt Loth. 01/25/2021 7:30 A M Medical Record Number: [id number] Patient Account Number: [id number] Date of Birth/Sex: Treating RN: [birth year] (70 y.o. [name], Lauren Primary Care Provider: [name] Other Clinician: Referring Provider: Treating Provider/Extender: Bartholome Bill in Treatment: 7 Information Obtained From Patient Cardiovascular Medical History: Positive for: Coronary Artery Disease; Peripheral Arterial Disease; Peripheral Venous Disease Immunizations Pneumococcal Vaccine: Received Pneumococcal Vaccination: Yes Implantable Devices None Family and Social History Unknown History: Yes; Never smoker; Marital Status - Married; Alcohol Use: Moderate; Drug Use: No History; Caffeine Use: Daily; Financial Concerns: No; Food, Clothing or Shelter Needs: No; Support System Lacking: No; Transportation Concerns: No Electronic Signature(s) Signed: 01/25/2021 8:30:08 AM By: Kalman Shan DO Signed: 01/25/2021 7:48:44 PM By: Rhae Hammock RN Entered By: Kalman Shan on 01/25/2021 08:22:50 -------------------------------------------------------------------------------- SuperBill Details Patient Name: Date of Service: Wyatt Loth 01/25/2021 Medical Record Number: [id number] Patient Account Number: [id number] Date of Birth/Sex: Treating RN: 1950-12-22 (70 y.o. 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 I73.9 Peripheral vascular disease, unspecified Facility Procedures CPT4 Code: [id number] Description: 69678 - DEB SUBQ TISSUE 20 SQ CM/< ICD-10 Diagnosis Description L97.519 Non-pressure chronic ulcer of other part of right foot with unspecified severi Modifier: ty Quantity: 1 Physician Procedures : CPT4 Code Description Modifier [id number] - WC PHYS SUBQ TISS 20 SQ CM ICD-10 Diagnosis Description L97.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

## 2021-01-25 NOTE — Progress Notes (Signed)
JHORDAN, MCKIBBEN ([id number]) Visit Report for 01/25/2021 Arrival Information Details Patient Name: Date of Service: Wyatt, Jackson 01/25/2021 7:30 A M Medical Record Number: [id number] Patient Account Number: [id number] Date of Birth/Sex: Treating RN: [birth year] (70 y.o. [name] Primary Care Devario Bucklew: [name] Other Clinician: Referring Myndi Wamble: Treating Rashay Barnette/Extender: Bartholome Bill in Treatment: 7 Visit Information History Since Last Visit Added or deleted any medications: No Patient Arrived: Ambulatory Any new allergies or adverse reactions: No Arrival Time: 07:47 Had a fall or experienced change in No Transfer Assistance: None activities of daily living that may affect Patient Identification Verified: Yes risk of falls: Secondary Verification Process Completed: Yes Signs or symptoms of abuse/neglect since last visito No Patient Requires Transmission-Based Precautions: No Hospitalized since last visit: No Patient Has Alerts: Yes Implantable device outside of the clinic excluding No Patient Alerts: Patient on Blood Thinner cellular tissue based products placed in the center ABI R=0.95 since last visit: ABI L=1.21 Has Dressing in Place as Prescribed: Yes Pain Present Now: No Electronic Signature(s) Signed: 01/25/2021 6:43:27 PM By: [name] Entered By: [name] on 01/25/2021 07:48:22 -------------------------------------------------------------------------------- Encounter Discharge Information Details Patient Name: Date of Service: Wyatt Loth. 01/25/2021 7:30 A M Medical Record Number: [id number] Patient Account Number: [id number] Date of Birth/Sex: Treating RN: 1951-06-09 (70 y.o. [name] Primary Care Verl Whitmore: [name] Other Clinician: Referring Sidonia Nutter: Treating Kariss Longmire/Extender: Bartholome Bill in Treatment: 7 Encounter Discharge Information Items Post Procedure  Vitals Discharge Condition: Stable Temperature (F): 97.4 Ambulatory Status: Ambulatory Pulse (bpm): 83 Discharge Destination: Home Respiratory Rate (breaths/min): 16 Transportation: Private Auto Blood Pressure (mmHg): 117/79 Schedule Follow-up Appointment: Yes Clinical Summary of Care: Provided on 01/25/2021 Form Type Recipient Paper Patient Patient Electronic Signature(s) Signed: 01/25/2021 6:43:27 PM By: [name] Entered By: [name] on 01/25/2021 08:38:01 -------------------------------------------------------------------------------- Lower Extremity Assessment Details Patient Name: Date of Service: [name] 01/25/2021 7:30 A M Medical Record Number: [id number] Patient Account Number: [id number] Date of Birth/Sex: Treating RN: [birth year] (71 y.o. [name] Primary Care Talyah Seder: [name] Other Clinician: Referring Mayfield Schoene: Treating Coulson Wehner/Extender: Vivia Budge Weeks in Treatment: 7 Edema Assessment Assessed: Shirlyn Goltz: No] Patrice Paradise: Yes] Edema: [Left: Ye] [Right: s] Calf Left: Right: Point of Measurement: From Medial Instep 36 cm Ankle Left: Right: Point of Measurement: From Medial Instep 23.5 cm Vascular Assessment Pulses: Dorsalis Pedis Palpable: [Right:Yes] Electronic Signature(s) Signed: 01/25/2021 6:43:27 PM By: [name] Entered By: [name] on 01/25/2021 07:53:42 -------------------------------------------------------------------------------- Multi Wound Chart Details Patient Name: Date of Service: Wyatt Loth. 01/25/2021 7:30 A M Medical Record Number: [id number] Patient Account Number: [id number] Date of Birth/Sex: Treating RN: Oct 04, 1950 (70 y.o. [name] Primary Care Ashlon Lottman: [name] Other Clinician: Referring Ishan Sanroman: Treating Mathayus Stanbery/Extender: Bartholome Bill in Treatment: 7 Vital Signs Height(in): 72 Pulse(bpm): 23 Weight(lbs):  162 Blood Pressure(mmHg): 117/79 Body Mass Index(BMI): 22 Temperature(F): 97.4 Respiratory Rate(breaths/min): 16 Photos: [1:No Photos Right, Medial Lower Leg] [2:No Photos Right T Great oe] [3:No Photos Right, Distal, Medial Lower Leg] Wound Location: [1:Surgical Injury] [2:Gradually Appeared] [3:Gradually Appeared] Wounding Event: [1:Dehisced Wound] [2:Arterial Insufficiency Ulcer] [3:Venous Leg Ulcer] Primary Etiology: [1:Coronary Artery Disease, Peripheral] [2:Coronary Artery Disease, Peripheral] [3:Coronary Artery Disease, Peripheral] Comorbid History: [1:Arterial Disease, Peripheral Venous Disease 11/16/2020] [2:Arterial Disease, Peripheral Venous Disease 09/18/2020] [3:Arterial Disease, Peripheral Venous Disease 12/11/2020] Date Acquired: [1:7] [2:7] [3:6] Weeks of Treatment: [1:Healed - Epithelialized] [2:Open] [3:Open] Wound Status: [1:0x0x0] [2:0.2x0.2x0.1] [3:0.6x0.4x0.5] Measurements  L x W x D (cm) [1:0] [2:0.031] [3:0.188] A (cm) : rea [1:0] [2:0.003] [3:0.094] Volume (cm) : [1:100.00%] [2:98.20%] [3:-13.90%] % Reduction in Area: [1:100.00%] [2:99.40%] [3:-184.80%] % Reduction in Volume: [1:Full Thickness Without Exposed] [2:Full Thickness With Exposed Support] [3:Full Thickness Without Exposed] Classification: [1:Support Structures [name]] [2:Structures Small] [3:Support Structures Medium] Exudate A mount: [1:[name]] [2:Serous] [3:Serosanguineous] Exudate Type: [1:[name]] [2:amber] [3:red, brown] Exudate Color: [1:[name]] [2:Distinct, outline attached] [3:Distinct, outline attached] Wound Margin: [1:[name]] [2:Large (67-100%)] [3:Large (67-100%)] Granulation A mount: [1:[name]] [2:Red, Pink] [3:Red, Pink] Granulation Quality: [1:[name]] [2:Small (1-33%)] [3:None Present (0%)] Necrotic A mount: [1:[name]] [2:Eschar] [3:[name]] Necrotic Tissue: [1:[name]] [2:Large (67-100%)] [3:Small (1-33%)] Epithelialization: [1:[name]] [2:[name]] [3:Debridement - Excisional] Debridement: Pre-procedure Verification/Time Out  [name] [2:[name]] [3:08:15] Taken: [1:[name]] [2:[name]] [3:Lidocaine] Pain Control: [1:[name]] [2:[name]] [3:Subcutaneous, Slough] Tissue Debrided: [1:[name]] [2:[name]] [3:Skin/Subcutaneous Tissue] Level: [1:[name]] [2:[name]] [3:0.24] Debridement A (sq cm): [1:rea [name]] [2:[name]] [3:Curette] Instrument: [1:[name]] [2:[name]] [3:Minimum] Bleeding: [1:[name]] [2:[name]] [3:Calcium Alginate] Hemostasis A chieved: [1:[name]] [2:[name]] [3:0] Procedural Pain: [1:[name]] [2:[name]] [3:0] Post Procedural Pain: [1:[name]] [2:[name]] [3:Procedure was tolerated well] Debridement Treatment Response: [1:[name]] [2:[name]] [3:0.6x0.4x0.5] Post Debridement Measurements L x W x D (cm) [1:[name]] [2:[name]] [3:0.094] Post Debridement Volume: (cm) [1:[name]] [2:Calloused periwound] [3:[name]] Assessment Notes: [1:[name]] [2:[name]] [3:Debridement] Treatment Notes Electronic Signature(s) Signed: 01/25/2021 8:30:08 AM By: Kalman Shan DO Signed: 01/25/2021 7:48:44 PM By: Rhae Hammock RN Entered By: Kalman Shan on 01/25/2021 08:19:55 -------------------------------------------------------------------------------- Multi-Disciplinary Care Plan Details Patient Name: Date of Service: Wyatt Loth. 01/25/2021 7:30 A M Medical Record Number: [id number] Patient Account Number: [id number] Date of Birth/Sex: Treating RN: Jul 19, 1950 (70 y.o. [name] Primary Care Johanan Skorupski: [name] Other Clinician: Referring Fotios Amos: Treating Kaleya Douse/Extender: Bartholome Bill in Treatment: 7 Active Inactive Wound/Skin Impairment Nursing Diagnoses: Impaired tissue integrity Knowledge deficit related to ulceration/compromised skin integrity Goals: Patient will have a decrease in wound volume by X% from date: (specify in notes) Date Initiated: 12/07/2020 Target Resolution Date: 02/02/2021 Goal Status: Active Patient/caregiver will verbalize understanding of skin care regimen Date Initiated: 12/07/2020 Target Resolution Date: 02/01/2021 Goal Status:  Active Ulcer/skin breakdown will have a volume reduction of 30% by week 4 Date Initiated: 12/07/2020 Target Resolution Date: 01/13/2021 Goal Status: Active Interventions: Assess patient/caregiver ability to obtain necessary supplies Assess patient/caregiver ability to perform ulcer/skin care regimen upon admission and as needed Assess ulceration(s) every visit Notes: Electronic Signature(s) Signed: 01/25/2021 7:48:44 PM By: Rhae Hammock RN Entered By: Rhae Hammock on 01/25/2021 08:18:58 -------------------------------------------------------------------------------- Pain Assessment Details Patient Name: Date of Service: Wyatt Loth. 01/25/2021 7:30 A M Medical Record Number: [id number] Patient Account Number: [id number] Date of Birth/Sex: Treating RN: [birth year] (70 y.o. [name] Primary Care Aleen Marston: [name] Other Clinician: Referring Lataunya Ruud: Treating Kasim Mccorkle/Extender: Bartholome Bill in Treatment: 7 Active Problems Location of Pain Severity and Description of Pain Patient Has Paino No Site Locations Pain Management and Medication Current Pain Management: Electronic Signature(s) Signed: 01/25/2021 6:43:27 PM By: [name] Entered By: [name] on 01/25/2021 07:49:06 -------------------------------------------------------------------------------- Patient/Caregiver Education Details Patient Name: Date of Service: Wyatt Loth 7/21/2022andnbsp7:30 [place] Record Number: [id number] Patient Account Number: [id number] Date of Birth/Gender: Treating RN: 12-15-1950 (70 y.o. Erie Noe Primary Care Physician: [name] Other Clinician: Referring Physician: Treating Physician/Extender: Bartholome Bill in Treatment: 7 Education Assessment Education Provided To: Patient Education Topics Provided Wound/Skin Impairment: Methods: Explain/Verbal Responses: State content  correctly [employer]) Signed: 01/25/2021 7:48:44 PM By: Rhae Hammock RN Entered  By: Rhae Hammock on 01/25/2021 08:19:12 -------------------------------------------------------------------------------- Wound Assessment Details Patient Name: Date of Service: DAYVION, SANS 01/25/2021 7:30 A M Medical Record Number: [id number] Patient Account Number: [id number] Date of Birth/Sex: Treating RN: May 07, 1951 (70 y.o. [name] Primary Care Keahi Mccarney: [name] Other Clinician: Referring Niko Jakel: Treating Leoncio Hansen/Extender: Vivia Budge Weeks in Treatment: 7 Wound Status Wound Number: 1 Primary Dehisced Wound Etiology: Wound Location: Right, Medial Lower Leg Wound Healed - Epithelialized Wounding Event: Surgical Injury Status: Date Acquired: 11/16/2020 Comorbid Coronary Artery Disease, Peripheral Arterial Disease, Weeks Of Treatment: 7 History: Peripheral Venous Disease Clustered Wound: No Wound Measurements Length: (cm) Width: (cm) Depth: (cm) Area: (cm) Volume: (cm) 0 % Reduction in Area: 100% 0 % Reduction in Volume: 100% 0 0 0 Wound Description Classification: Full Thickness Without Exposed Support Structur es Electronic Signature(s) Signed: 01/25/2021 6:43:27 PM By: [name] Entered By: [name] on 01/25/2021 07:50:29 -------------------------------------------------------------------------------- Wound Assessment Details Patient Name: Date of Service: Wyatt Loth. 01/25/2021 7:30 A M Medical Record Number: [id number] Patient Account Number: [id number] Date of Birth/Sex: Treating RN: 01-13-1951 (70 y.o. [name] Primary Care Vale Mousseau: Other Clinician: Sueanne Jackson Referring Jamaris Theard: Treating Idamae Coccia/Extender: Bartholome Bill in Treatment: 7 Wound Status Wound Number: 2 Primary Arterial Insufficiency Ulcer Etiology: Wound Location: Right T Great oe Wound  Open Wounding Event: Gradually Appeared Status: Date Acquired: 09/18/2020 Comorbid Coronary Artery Disease, Peripheral Arterial Disease, Weeks Of Treatment: 7 History: Peripheral Venous Disease Clustered Wound: No Wound Measurements Length: (cm) 0.2 Width: (cm) 0.2 Depth: (cm) 0.1 Area: (cm) 0.031 Volume: (cm) 0.003 % Reduction in Area: 98.2% % Reduction in Volume: 99.4% Epithelialization: Large (67-100%) Tunneling: No Undermining: No Wound Description Classification: Full Thickness With Exposed Support Structures Wound Margin: Distinct, outline attached Exudate Amount: Small Exudate Type: Serous Exudate Color: amber Foul Odor After Cleansing: No Slough/Fibrino No Wound Bed Granulation Amount: Large (67-100%) Exposed Structure Granulation Quality: Red, Pink Fascia Exposed: No Necrotic Amount: Small (1-33%) Fat Layer (Subcutaneous Tissue) Exposed: Yes Necrotic Quality: Eschar Tendon Exposed: No Muscle Exposed: No Joint Exposed: No Bone Exposed: Yes Assessment Notes Calloused periwound Treatment Notes Wound #2 (Toe Great) Wound Laterality: Right Cleanser Soap and Water Discharge Instruction: May shower and wash wound with dial antibacterial soap and water prior to dressing change. Peri-Wound Care Topical Primary Dressing betadine Discharge Instruction: apply daily Secondary Dressing Woven Gauze Sponge, Non-Sterile 4x4 in Discharge Instruction: Apply over primary dressing as directed. Secured With 56M Medipore H Soft Cloth Surgical T 4 x 2 (in/yd) ape Discharge Instruction: Secure dressing with tape as directed. Compression Wrap Compression Stockings Add-Ons Electronic Signature(s) Signed: 01/25/2021 6:43:27 PM By: [name] Entered By: [name] on 01/25/2021 07:52:45 -------------------------------------------------------------------------------- Wound Assessment Details Patient Name: Date of Service: QUARON, DELACRUZ 01/25/2021 7:30 A  M Medical Record Number: [id number] Patient Account Number: [id number] Date of Birth/Sex: Treating RN: Aug 17, 1950 (70 y.o. [name] Primary Care Theran Vandergrift: [name] Other Clinician: Referring Shantaya Bluestone: Treating Jalesha Plotz/Extender: Vivia Budge Weeks in Treatment: 7 Wound Status Wound Number: 3 Primary Venous Leg Ulcer Etiology: Wound Location: Right, Distal, Medial Lower Leg Wound Open Wounding Event: Gradually Appeared Status: Date Acquired: 12/11/2020 Comorbid Coronary Artery Disease, Peripheral Arterial Disease, Weeks Of Treatment: 6 History: Peripheral Venous Disease Clustered Wound: No Wound Measurements Length: (cm) 0.6 Width: (cm) 0.4 Depth: (cm) 0.5 Area: (cm) 0.188 Volume: (cm) 0.094 % Reduction in Area: -13.9% % Reduction in Volume: -184.8% Epithelialization: Small (1-33%) Tunneling: No Undermining:  No Wound Description Classification: Full Thickness Without Exposed Support Structures Wound Margin: Distinct, outline attached Exudate Amount: Medium Exudate Type: Serosanguineous Exudate Color: red, brown Foul Odor After Cleansing: No Slough/Fibrino No Wound Bed Granulation Amount: Large (67-100%) Exposed Structure Granulation Quality: Red, Pink Fascia Exposed: No Necrotic Amount: None Present (0%) Fat Layer (Subcutaneous Tissue) Exposed: Yes Tendon Exposed: No Muscle Exposed: No Joint Exposed: No Bone Exposed: No Treatment Notes Wound #3 (Lower Leg) Wound Laterality: Right, Medial, Distal Cleanser Soap and Water Discharge Instruction: May shower and wash wound with dial antibacterial soap and water prior to dressing change. Wound Cleanser Discharge Instruction: Cleanse the wound with wound cleanser prior to applying a clean dressing using gauze sponges, not tissue or cotton balls. Peri-Wound Care Sween Lotion (Moisturizing lotion) Discharge Instruction: Apply moisturizing lotion as directed Topical Primary  Dressing KerraCel Ag Gelling Fiber Dressing, 2x2 in (silver alginate) Discharge Instruction: lightly pack into undermining and wound bed as instructed Secondary Dressing Woven Gauze Sponge, Non-Sterile 4x4 in Discharge Instruction: Apply over primary dressing as directed. Secured With Compression Wrap Kerlix Roll 4.5x3.1 (in/yd) Discharge Instruction: Apply Kerlix and Coban compression as directed. Coban Self-Adherent Wrap 4x5 (in/yd) Discharge Instruction: Apply over Kerlix as directed. Compression Stockings Add-Ons Electronic Signature(s) Signed: 01/25/2021 6:43:27 PM By: [name] Entered By: [name] on 01/25/2021 07:51:30 -------------------------------------------------------------------------------- Vitals Details Patient Name: Date of Service: Wyatt Loth. 01/25/2021 7:30 A M Medical Record Number: [id number] Patient Account Number: [id number] Date of Birth/Sex: Treating RN: 1951-05-17 (70 y.o. [name] Primary Care Daneille Desilva: [name] Other Clinician: Referring Drue Camera: Treating Jaymie Misch/Extender: Bartholome Bill in Treatment: 7 Vital Signs Time Taken: 07:48 Temperature (F): 97.4 Height (in): 72 Pulse (bpm): 83 Weight (lbs): 162 Respiratory Rate (breaths/min): 16 Body Mass Index (BMI): 22 Blood Pressure (mmHg): 117/79 Reference Range: 80 - 120 mg / dl Electronic Signature(s) Signed: 01/25/2021 6:43:27 PM By: [name] Entered By: [name] on 01/25/2021 07:48:58

## 2021-01-29 ENCOUNTER — Encounter (HOSPITAL_BASED_OUTPATIENT_CLINIC_OR_DEPARTMENT_OTHER): Payer: PPO | Admitting: Internal Medicine

## 2021-01-30 DIAGNOSIS — Z125 Encounter for screening for malignant neoplasm of prostate: Secondary | ICD-10-CM | POA: Diagnosis not present

## 2021-01-30 DIAGNOSIS — E785 Hyperlipidemia, unspecified: Secondary | ICD-10-CM | POA: Diagnosis not present

## 2021-02-02 ENCOUNTER — Encounter (HOSPITAL_BASED_OUTPATIENT_CLINIC_OR_DEPARTMENT_OTHER): Payer: PPO | Admitting: Internal Medicine

## 2021-02-02 ENCOUNTER — Other Ambulatory Visit: Payer: Self-pay | Admitting: Vascular Surgery

## 2021-02-02 ENCOUNTER — Other Ambulatory Visit: Payer: Self-pay

## 2021-02-02 DIAGNOSIS — L97519 Non-pressure chronic ulcer of other part of right foot with unspecified severity: Secondary | ICD-10-CM

## 2021-02-02 DIAGNOSIS — I739 Peripheral vascular disease, unspecified: Secondary | ICD-10-CM | POA: Diagnosis not present

## 2021-02-02 DIAGNOSIS — L97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 with unspecified severity: Secondary | ICD-10-CM

## 2021-02-02 DIAGNOSIS — T8130XD Disruption of wound, unspecified, subsequent encounter: Secondary | ICD-10-CM | POA: Diagnosis not present

## 2021-02-02 NOTE — Progress Notes (Signed)
DENIM, HOAGE ([id number]) Visit Report for 02/02/2021 Chief Complaint Document Details Patient Name: Date of Service: [name], [name] 02/02/2021 8:00 A M Medical Record Number: [id number] Patient Account Number: [id number] Date of Birth/Sex: Treating RN: 02-27-1951 (70 y.o. [name] Primary Care Provider: [name] Other Clinician: Referring Provider: Treating Provider/Extender: Bartholome Bill in Treatment: 8 Information Obtained from: Patient Chief Complaint Surgical wound dehiscence of right lower extremity and Great toe wound. Electronic Signature(s) Signed: 02/02/2021 9:19:02 AM By: Kalman Shan DO Entered By: Kalman Shan on 02/02/2021 09:09:17 -------------------------------------------------------------------------------- HPI Details Patient Name: Date of Service: Wyatt Loth. 02/02/2021 8:00 A M Medical Record Number: [id number] Patient Account Number: [id number] Date of Birth/Sex: Treating RN: 1950-12-20 (70 y.o. [name] Primary Care Provider: [name] Other Clinician: Referring Provider: Treating Provider/Extender: Bartholome Bill in Treatment: 8 History of Present Illness HPI Description: Admission 6/2 Mr. Nicholai Boerboom is a 70 year old male with a past medical history of peripheral arterial disease status post right common femoral to peroneal bypass on 10/13/2020 and coil embolization of bypass fistula x2 on 11/01/2020. He presents today because he has wound dehiscence that occurred 3 weeks ago from his recent vascular surgery. He has been using wet-to-dry dressings to this area. He also has necrotic tissue to the right great toe. This was the original symptom that precipitated further vascular evaluation that led to his aortogram and ultimately bypass graft. He is currently keeping this area covered. He currently denies signs of infection. 6/6; patient presents for 1 week follow-up. He  had Kerlix/Coban compression for the past week with Hydrofera Blue underneath. He reports tolerating this well without any issues. He thinks the wound looks smaller today. He has been using Betadine to the toe wound. He has no complaints today. He denies signs of infection. 6/20; patient presents for 2-week follow-up. He had Kerlix/Coban compression with Hydrofera Blue. He has tolerated this well. He reports significant improvement in leg swelling. He also thinks the wound looks smaller. He has no complaints today and denies signs of infection. 6/27; patient presents for 1 week follow-up. He continues to use Hydrofera Blue under Kerlix/Coban compression and has tolerated this well. He continues to report improvement in leg swelling. He is content with the wound care progress at this time. He denies signs of infection. 7/5; patient presents for 1 week follow-up. He has been using Hydrofera Blue and Santyl under Kerlix/Coban. He reports tolerating this well. He has some mild tenderness to the distal portion of the right leg wound. He denies signs of infection. 7/11; patient presents for 1 week follow-up. He has been using Hydrofera Blue and Santyl under Kerlix/Coban. He has no issues or complaints today. He was started on antibiotics at last clinic visit and reports improvement in tenderness and erythema to the wound. 7/21; patient presents for 1 week follow-up. He has been using Hydrofera Blue and Santyl, silver alginate under Kerlix/Coban. He has no issues or complaints today. He reports improvement T his wound healing. Unfortunately he hit his right great toe and split the callus open a little bit. He denies signs of infection. o 7/29; patient presents for 1 week follow-up. He has tolerated the compression wrap well and has no issues or complaints today. He is going to be out of town next week and will not return until next weekend. He denies signs of infection. Electronic Signature(s) Signed:  02/02/2021 9:19:02 AM By: Kalman Shan DO Entered By: Kalman Shan  on 02/02/2021 09:09:53 -------------------------------------------------------------------------------- Physical Exam Details Patient Name: Date of Service: [name] 02/02/2021 8:00 A M Medical Record Number: [id number] Patient Account Number: [id number] Date of Birth/Sex: Treating RN: [birth year] (70 y.o. [name] Primary Care Provider: [name] Other Clinician: Referring Provider: Treating Provider/Extender: Bartholome Bill in Treatment: 8 Constitutional respirations regular, non-labored and within target range for patient.Marland Kitchen Psychiatric pleasant and cooperative. Notes Right lower extremity: Good edema control. Open wound to the distal portion of the anterior shin with Granulation tissue. Right foot: The right great toe has callused. No signs of infection Electronic Signature(s) Signed: 02/02/2021 9:19:02 AM By: Kalman Shan DO Entered By: Kalman Shan on 02/02/2021 09:10:55 -------------------------------------------------------------------------------- Physician Orders Details Patient Name: Date of Service: Wyatt Loth. 02/02/2021 8:00 A M Medical Record Number: [id number] Patient Account Number: [id number] Date of Birth/Sex: Treating RN: 1950/10/23 (70 y.o. 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 - Tues 8/9  with Dr. Heber Milaca on Friday Return A Bathing/ Shower/ Hygiene May shower with protection but do not get wound dressing(s) wet. Edema Control - Lymphedema / SCD / Other Elevate legs to the level of the heart or above for 30 minutes daily and/or when sitting, a frequency of: - throughout the day Avoid standing for long periods of time. Exercise regularly Additional Orders / Instructions Follow Nutritious Diet Non Wound Condition Other Non Wound Condition Orders/Instructions: - Put small piece of CaAlgAg over healed area on Right Ant. LEG Wound Treatment Wound #2 - T Great oe Wound Laterality: Right Cleanser: Soap and Water 1 x Per Day/7 Days Discharge Instructions: May shower and wash wound with dial antibacterial soap and water prior to dressing change. Prim Dressing: betadine 1 x Per Day/7 Days ary Discharge Instructions: apply daily Secondary Dressing: Woven Gauze Sponge, Non-Sterile 4x4 in 1 x Per Day/7 Days Discharge Instructions: Apply over primary dressing as directed. Secured With: 74M Medipore H Soft Cloth Surgical T 4 x 2 (in/yd) 1 x Per Day/7 Days ape Discharge Instructions: Secure dressing with tape as directed. Wound #3 - Lower Leg Wound Laterality: Right, Medial, Distal Cleanser: Soap and Water 1 x Per Week/7 Days Discharge Instructions: May shower and wash wound with dial antibacterial soap and water prior to dressing change. Cleanser: Wound Cleanser (Generic) 1 x Per Week/7 Days Discharge Instructions: Cleanse the wound with wound cleanser prior to applying a clean dressing using gauze sponges, not tissue or cotton balls. Peri-Wound Care: Sween Lotion (Moisturizing lotion) 1 x Per Week/7 Days Discharge Instructions: Apply moisturizing lotion as directed Prim Dressing: KerraCel Ag Gelling Fiber Dressing, 2x2 in (silver alginate) 1 x Per Week/7 Days ary Discharge Instructions: lightly pack into undermining and wound bed , change every other day after wrap  removed Secondary Dressing: Woven Gauze Sponge, Non-Sterile 4x4 in (Generic) 1 x Per Week/7 Days Discharge Instructions: Apply over primary dressing as directed. Compression Wrap: Kerlix Roll 4.5x3.1 (in/yd) (Generic) 1 x Per Week/7 Days Discharge Instructions: Apply Kerlix and Coban compression , remove wrap next Friday or Saturday. Compression Wrap: Coban Self-Adherent Wrap 4x5 (in/yd) (Generic)  1 x Per Week/7 Days Discharge Instructions: Apply over Kerlix as directed. Electronic Signature(s) Signed: 02/02/2021 9:19:02 AM By: Kalman Shan DO Entered By: Kalman Shan on 02/02/2021 09:11:11 -------------------------------------------------------------------------------- Problem List Details Patient Name: Date of Service: Wyatt Loth. 02/02/2021 8:00 A M Medical Record Number: [id number] Patient Account Number: [id number] Date of Birth/Sex: Treating RN: 1950-07-24 (70 y.o. 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 02/02/2021 8:00 A M Medical Record Number: [id number] Patient Account Number: [id number] Date of Birth/Sex: Treating RN: 15-Nov-1950 (70 y.o. [name] Primary Care Provider: [name] Other Clinician: Referring Provider: Treating Provider/Extender: Bartholome Bill in Treatment: 8 Subjective Chief Complaint Information obtained from Patient Surgical wound dehiscence of right lower extremity and Great toe wound. History of Present Illness (HPI) Admission 6/2 Mr. Coven Dyce is a 70 year old male with a past medical history of peripheral arterial disease status post right common femoral to peroneal bypass on 10/13/2020 and coil embolization of bypass fistula x2 on 11/01/2020. He presents today because he has wound dehiscence that occurred 3 weeks ago from his recent vascular surgery. He has been using wet-to-dry dressings to this area. He also has necrotic tissue to the right great toe. This was the original symptom that precipitated further vascular evaluation that led to his aortogram and ultimately bypass graft. He is currently keeping this area covered. He currently denies signs of infection. 6/6; patient presents for 1 week follow-up. He had Kerlix/Coban compression for the past week with Hydrofera Blue underneath. He reports tolerating this well without any issues. He thinks the wound looks smaller today. He has been using Betadine to the toe wound. He has no complaints today. He denies signs of infection. 6/20; patient presents for 2-week follow-up. He had Kerlix/Coban compression with Hydrofera Blue. He has tolerated this well. He reports significant improvement in leg swelling. He also thinks the wound looks smaller. He has no complaints today and denies signs of infection. 6/27; patient presents for 1 week follow-up. He continues to use Hydrofera Blue under Kerlix/Coban compression and has  tolerated this well. He continues to report improvement in leg swelling. He is content with the wound care progress at this time. He denies signs of infection. 7/5; patient presents for 1 week follow-up. He has been using Hydrofera Blue and Santyl under Kerlix/Coban. He reports tolerating this well. He has some mild tenderness to the distal portion of the right leg wound. He denies signs of infection. 7/11; patient presents for 1 week follow-up. He has been using Hydrofera Blue and Santyl under Kerlix/Coban. He has no issues or complaints today. He was started on antibiotics at last clinic visit and reports improvement in tenderness and erythema to the wound. 7/21; patient presents for 1 week follow-up. He  has been using Hydrofera Blue and Santyl, silver alginate under Kerlix/Coban. He has no issues or complaints today. He reports improvement T his wound healing. Unfortunately he hit his right great toe and split the callus open a little bit. He denies signs of infection. o 7/29; patient presents for 1 week follow-up. He has tolerated the compression wrap well and has no issues or complaints today. He is going to be out of town next week and will not return until next weekend. He denies signs of infection. Patient History Information obtained from Patient. Family History Unknown History. Social History Never smoker, Marital Status - Married, Alcohol Use - Moderate, Drug Use - No History, Caffeine Use - Daily. Medical History Cardiovascular Patient has history of Coronary Artery Disease, Peripheral Arterial Disease, Peripheral Venous Disease Objective Constitutional respirations regular, non-labored and within target range for patient.. Vitals Time Taken: 8:09 AM, Height: 72 in, Weight: 162 lbs, BMI: 22, Temperature: 97.7 F, Pulse: 80 bpm, Respiratory Rate: 16 breaths/min, Blood Pressure: 117/78 mmHg. Psychiatric pleasant and cooperative. General Notes: Right lower extremity: Good edema  control. Open wound to the distal portion of the anterior shin with Granulation tissue. Right foot: The right great toe has callused. No signs of infection Integumentary (Hair, Skin) Wound #2 status is Open. Original cause of wound was Gradually Appeared. The date acquired was: 09/18/2020. The wound has been in treatment 8 weeks. The wound is located on the Right T Great. The wound measures 0.3cm length x 0.2cm width x 0.2cm depth; 0.047cm^2 area and 0.009cm^3 volume. There is oe bone and Fat Layer (Subcutaneous Tissue) exposed. There is no tunneling or undermining noted. There is a small amount of serous drainage noted. The wound margin is distinct with the outline attached to the wound base. There is large (67-100%) red, pink granulation within the wound bed. There is a small (1-33%) amount of necrotic tissue within the wound bed including Eschar. Wound #3 status is Open. Original cause of wound was Gradually Appeared. The date acquired was: 12/11/2020. The wound has been in treatment 7 weeks. The wound is located on the Right,Distal,Medial Lower Leg. The wound measures 0.3cm length x 0.2cm width x 0.4cm depth; 0.047cm^2 area and 0.019cm^3 volume. There is Fat Layer (Subcutaneous Tissue) exposed. There is no undermining noted, however, there is tunneling at 12:00 with a maximum distance of 1cm. There is a medium amount of serosanguineous drainage noted. The wound margin is distinct with the outline attached to the wound base. There is large (67-100%) red, pink granulation within the wound bed. There is no necrotic tissue within the wound bed. 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 No signs of infection. The right leg wound appears clean and I recommended continuing silver alginate with Kerlix/Coban. The right great toe has callus. I recommended Betadine to this. I asked patient to take off the wrap in 1 week since he will be out of town . He has silver alginate to place in the wound bed every other day before his next follow-up on Tuesday. Plan Follow-up Appointments: Return Appointment in 1 week. - Tues 8/9 with Dr. Heber  on Friday Bathing/ Shower/ Hygiene: May shower with protection but do not get wound dressing(s) wet. Edema Control - Lymphedema /  SCD / Other: Elevate legs to the level of the heart or above for 30 minutes daily and/or when sitting, a frequency of: - throughout the day Avoid standing for long periods of time. Exercise regularly Additional Orders / Instructions: Follow Nutritious Diet Non Wound Condition: Other Non Wound Condition Orders/Instructions: - Put small piece of CaAlgAg over healed area on Right Ant. LEG WOUND #2: - T Great Wound Laterality: Right oe Cleanser: Soap and Water 1 x Per Day/7 Days Discharge Instructions: May shower and wash wound with dial antibacterial soap and water prior to dressing change. Prim Dressing: betadine 1 x Per Day/7 Days ary Discharge Instructions: apply daily Secondary Dressing: Woven Gauze Sponge, Non-Sterile 4x4 in 1 x Per Day/7 Days Discharge Instructions: Apply over primary dressing as directed. Secured With: 31M Medipore H Soft Cloth Surgical T 4 x 2 (in/yd) 1 x Per Day/7 Days ape Discharge Instructions: Secure dressing with tape as directed. WOUND #3: - Lower Leg Wound Laterality: Right, Medial, Distal Cleanser: Soap and Water 1 x Per Week/7 Days Discharge Instructions: May shower and wash wound with dial antibacterial soap and water prior to dressing change. Cleanser: Wound Cleanser (Generic) 1 x Per Week/7 Days Discharge Instructions: Cleanse the wound with wound cleanser prior to  applying a clean dressing using gauze sponges, not tissue or cotton balls. Peri-Wound Care: Sween Lotion (Moisturizing lotion) 1 x Per Week/7 Days Discharge Instructions: Apply moisturizing lotion as directed Prim Dressing: KerraCel Ag Gelling Fiber Dressing, 2x2 in (silver alginate) 1 x Per Week/7 Days ary Discharge Instructions: lightly pack into undermining and wound bed , change every other day after wrap removed Secondary Dressing: Woven Gauze Sponge, Non-Sterile 4x4 in (Generic) 1 x Per Week/7 Days Discharge Instructions: Apply over primary dressing as directed. Com pression Wrap: Kerlix Roll 4.5x3.1 (in/yd) (Generic) 1 x Per Week/7 Days Discharge Instructions: Apply Kerlix and Coban compression , remove wrap next Friday or Saturday. Com pression Wrap: Coban Self-Adherent Wrap 4x5 (in/yd) (Generic) 1 x Per Week/7 Days Discharge Instructions: Apply over Kerlix as directed. 1. Continue silver alginate under Kerlix/Coban 2. Follow-up 8/9 Electronic Signature(s) Signed: 02/02/2021 9:19:02 AM By: Kalman Shan DO Entered By: Kalman Shan on 02/02/2021 09:18:31 -------------------------------------------------------------------------------- HxROS Details Patient Name: Date of Service: Wyatt Loth. 02/02/2021 8:00 A M Medical Record Number: [id number] Patient Account Number: [id number] Date of Birth/Sex: Treating RN: 11-25-1950 (70 y.o. [name] Primary Care Provider: [name] Other Clinician: Referring Provider: Treating Provider/Extender: Bartholome Bill in Treatment: 8 Information Obtained From Patient Cardiovascular Medical History: Positive for: Coronary Artery Disease; Peripheral Arterial Disease; Peripheral Venous Disease Immunizations Pneumococcal Vaccine: Received Pneumococcal Vaccination: Yes Received Pneumococcal Vaccination On or After 60th Birthday: No Implantable Devices None Family and Social History Unknown  History: Yes; Never smoker; Marital Status - Married; Alcohol Use: Moderate; Drug Use: No History; Caffeine Use: Daily; Financial Concerns: No; Food, Clothing or Shelter Needs: No; Support System Lacking: No; Transportation Concerns: No Electronic Signature(s) Signed: 02/02/2021 9:19:02 AM By: Kalman Shan DO Signed: 02/02/2021 2:27:43 PM By: Baruch Gouty RN, BSN Entered By: Kalman Shan on 02/02/2021 09:10:01 -------------------------------------------------------------------------------- SuperBill Details Patient Name: Date of Service: Wyatt Loth 02/02/2021 Medical Record Number: [id number] Patient Account Number: [id number] Date of Birth/Sex: Treating RN: [birth year] (70 y.o. 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 I73.9 Peripheral vascular disease, unspecified Facility Procedures CPT4 Code: [id number] Description: 99214 - WOUND CARE VISIT-LEV 4 EST PT Modifier: Quantity: 1 Physician Procedures : CPT4 Code Description Modifier [id number] - WC PHYS LEVEL 3 - EST PT ICD-10 Diagnosis Description T81.30XD Disruption of wound, unspecified, subsequent encounter 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

## 2021-02-05 NOTE — Progress Notes (Signed)
[name], [name] ([id number]) Visit Report for 02/02/2021 Arrival Information Details Patient Name: Date of Service: [name], [name] 02/02/2021 8:00 A M Medical Record Number: [id number] Patient Account Number: [id number] Date of Birth/Sex: Treating RN: 06/16/1951 (70 y.o. [name] Primary Care Trey Gulbranson: [name] Other Clinician: Referring Hadyn Azer: Treating Larinda Herter/Extender: Bartholome Bill in Treatment: 8 Visit Information History Since Last Visit Added or deleted any medications: No Patient Arrived: Ambulatory Any new allergies or adverse reactions: No Arrival Time: 08:09 Had a fall or experienced change in No Accompanied By: self activities of daily living that may affect Transfer Assistance: None risk of falls: Patient Identification Verified: Yes Signs or symptoms of abuse/neglect since last visito No Secondary Verification Process Completed: Yes Hospitalized since last visit: No Patient Requires Transmission-Based Precautions: No Implantable device outside of the clinic excluding No Patient Has Alerts: Yes cellular tissue based products placed in the center Patient Alerts: Patient on Blood Thinner since last visit: ABI R=0.95 Has Dressing in Place as Prescribed: Yes ABI L=1.21 Pain Present Now: No Electronic Signature(s) Signed: 02/05/2021 1:33:39 PM By: Sandre Kitty Entered By: Sandre Kitty on 02/02/2021 08:10:00 -------------------------------------------------------------------------------- Clinic Level of Care Assessment Details Patient Name: Date of Service: [name], [name] 02/02/2021 8:00 A M Medical Record Number: [id number] Patient Account Number: [id number] Date of Birth/Sex: Treating RN: 11/16/1950 (69 y.o. [name] Primary Care Jyra Lagares: [name] Other Clinician: Referring Zayneb Baucum: Treating Robyn Galati/Extender: Bartholome Bill in Treatment: 8 Clinic Level of Care Assessment  Items TOOL 4 Quantity Score _0  - 0 Use when only an EandM is performed on FOLLOW-UP visit ASSESSMENTS - Nursing Assessment / Reassessment X- 1 10 Reassessment of Co-morbidities (includes updates in patient status) X- 1 5 Reassessment of Adherence to Treatment Plan ASSESSMENTS - Wound and Skin A ssessment / Reassessment _1  - 0 Simple Wound Assessment / Reassessment - one wound X- 2 5 Complex Wound Assessment / Reassessment - multiple wounds _2  - 0 Dermatologic / Skin Assessment (not related to wound area) ASSESSMENTS - Focused Assessment X- 1 5 Circumferential Edema Measurements - multi extremities _3  - 0 Nutritional Assessment / Counseling / Intervention X- 1 5 Lower Extremity Assessment (monofilament, tuning fork, pulses) _4  - 0 Peripheral Arterial Disease Assessment (using hand held doppler) ASSESSMENTS - Ostomy and/or Continence Assessment and Care _5  - 0 Incontinence Assessment and Management _6  - 0 Ostomy Care Assessment and Management (repouching, etc.) PROCESS - Coordination of Care X - Simple Patient / Family Education for ongoing care 1 15 _7  - 0 Complex (extensive) Patient / Family Education for ongoing care X- 1 10 Staff obtains Programmer, systems, Records, T Results / Process Orders est _8  - 0 Staff telephones HHA, Nursing Homes / Clarify orders / etc _9  - 0 Routine Transfer to another Facility (non-emergent condition) _10  - 0 Routine Hospital Admission (non-emergent condition) _11  - 0 New Admissions / Biomedical engineer / Ordering NPWT Apligraf, etc. , _12  - 0 Emergency Hospital Admission (emergent condition) X- 1 10 Simple Discharge Coordination _13  - 0 Complex (extensive) Discharge Coordination PROCESS - Special Needs _14  - 0 Pediatric / Minor Patient Management _15  - 0 Isolation Patient Management _16  - 0 Hearing / Language / Visual special needs _17  - 0 Assessment of Community assistance (transportation, D/C planning, etc.) _18  - 0 Additional  assistance / Altered mentation _19  - 0 Support Surface(s) Assessment (bed, cushion, seat, etc.) INTERVENTIONS - Wound Cleansing / Measurement _20  - 0 Simple Wound Cleansing - one wound X-  2 5 Complex Wound Cleansing - multiple wounds X- 1 5 Wound Imaging (photographs - any number of wounds) _0  - 0 Wound Tracing (instead of photographs) _1  - 0 Simple Wound Measurement - one wound X- 2 5 Complex Wound Measurement - multiple wounds INTERVENTIONS - Wound Dressings X - Small Wound Dressing one or multiple wounds 2 10 _2  - 0 Medium Wound Dressing one or multiple wounds _3  - 0 Large Wound Dressing one or multiple wounds X- 1 5 Application of Medications - topical <[id number]>_7<\/[id number]>_2  - 0 Application of Medications - injection INTERVENTIONS - Miscellaneous _5  - 0 External ear exam _6  - 0 Specimen Collection (cultures, biopsies, blood, body fluids, etc.) _7  - 0 Specimen(s) / Culture(s) sent or taken to Lab for analysis _8  - 0 Patient Transfer (multiple staff / Civil Service fast streamer / Similar devices) _9  - 0 Simple Staple / Suture removal (25 or less) _10  - 0 Complex Staple / Suture removal (26 or more) _11  - 0 Hypo / Hyperglycemic Management (close monitor of Blood Glucose) _12  - 0 Ankle / Brachial Index (ABI) - do not check if billed separately X- 1 5 Vital Signs Has the patient been seen at the hospital within the last three years: Yes Total Score: 125 Level Of Care: New/Established - Level 4 Electronic Signature(s) Signed: 02/02/2021 2:27:43 PM By: Baruch Gouty RN, BSN Entered By: Baruch Gouty on 02/02/2021 08:52:51 -------------------------------------------------------------------------------- Lower Extremity Assessment Details Patient Name: Date of Service: [name]. 02/02/2021 8:00 A M Medical Record Number: [id number] Patient Account Number: [id number] Date of Birth/Sex: Treating RN: 10-14-1950 (70 y.o. [name] Primary Care Latwan Luchsinger: [name] Other Clinician: Referring  Minnie Shi: Treating Vauda Salvucci/Extender: Vivia Budge Weeks in Treatment: 8 Edema Assessment Assessed: [Left: No] [Right: Yes] Edema: [Left: Ye] [Right: s] Calf Left: Right: Point of Measurement: From Medial Instep 32 cm Ankle Left: Right: Point of Measurement: From Medial Instep 23 cm Vascular Assessment Pulses: Dorsalis Pedis Palpable: [Right:Yes] Electronic Signature(s) Signed: 02/02/2021 1:14:47 PM By: Deon Pilling Entered By: Deon Pilling on 02/02/2021 08:29:57 -------------------------------------------------------------------------------- Multi Wound Chart Details Patient Name: Date of Service: [name]. 02/02/2021 8:00 A M Medical Record Number: [id number] Patient Account Number: [id number] Date of Birth/Sex: Treating RN: [birth year] (70 y.o. [name] Primary Care Zanai Mallari: [name] Other Clinician: Referring Michal Callicott: Treating Clarice Zulauf/Extender: Vivia Budge Weeks in Treatment: 8 Vital Signs Height(in): 72 Pulse(bpm): 80 Weight(lbs): 162 Blood Pressure(mmHg): 117/78 Body Mass Index(BMI): 22 Temperature(F): 97.7 Respiratory Rate(breaths/min): 16 Photos: [[name]:[name]] Right T Great oe Right, Distal, Medial Lower Leg [name] Wound Location: Gradually Appeared Gradually Appeared [name] Wounding Event: Arterial Insufficiency Ulcer Venous Leg Ulcer [name] Primary Etiology: Coronary Artery Disease, Peripheral Coronary Artery Disease, Peripheral [name] Comorbid History: Arterial Disease, Peripheral Venous Arterial Disease, Peripheral Venous Disease Disease 09/18/2020 12/11/2020 [name] Date Acquired: 8 7 [name] Weeks of Treatment: Open Open [name] Wound Status: 0.3x0.2x0.2 0.3x0.2x0.4 [name] Measurements L x W x D (cm) 0.047 0.047 [name] A (cm) : rea 0.009 0.019 [name] Volume (cm) : 97.30% 71.50% [name] % Reduction in A rea: 98.30% 42.40% [name] % Reduction in Volume: 12 Position 1 (o'clock): 1 Maximum Distance 1 (cm): No Yes  [name] Tunneling: Full Thickness With Exposed Support Full Thickness Without Exposed [name] Classification: Structures Support Structures Small Medium [name] Exudate A mount: Serous Serosanguineous [name] Exudate Type: amber red, brown [name] Exudate Color: Distinct, outline attached Distinct, outline attached [name] Wound Margin: Large (67-100%) Large (67-100%) [name] Granulation Amount: Red, Pink Red, Pink [name] Granulation  Quality: Small (1-33%) None Present (0%) [name] Necrotic Amount: Eschar [name] [name] Necrotic Tissue: Fat Layer (Subcutaneous Tissue): Yes Fat Layer (Subcutaneous Tissue): Yes [name] Exposed Structures: Bone: Yes Fascia: No Fascia: No Tendon: No Tendon: No Muscle: No Muscle: No Joint: No Joint: No Bone: No Large (67-100%) Small (1-33%) [name] Epithelialization: Treatment Notes Electronic Signature(s) Signed: 02/02/2021 9:19:02 AM By: Kalman Shan DO Signed: 02/02/2021 2:27:43 PM By: Baruch Gouty RN, BSN Entered By: Kalman Shan on 02/02/2021 09:09:11 -------------------------------------------------------------------------------- Multi-Disciplinary Care Plan Details Patient Name: Date of Service: [name]. 02/02/2021 8:00 A M Medical Record Number: [id number] Patient Account Number: [id number] Date of Birth/Sex: Treating RN: 30-Aug-1950 (70 y.o. [name] Primary Care Marina Desire: [name] Other Clinician: Referring Breckon Reeves: Treating Kynzie Polgar/Extender: Bartholome Bill in Treatment: 8 Active Inactive Wound/Skin Impairment Nursing Diagnoses: Impaired tissue integrity Knowledge deficit related to ulceration/compromised skin integrity Goals: Patient will have a decrease in wound volume by X% from date: (specify in notes) Date Initiated: 12/07/2020 Date Inactivated: 02/02/2021 Target Resolution Date: 03/02/2021 Goal Status: Met Patient/caregiver will verbalize understanding of skin care regimen Date Initiated: 12/07/2020 Target  Resolution Date: 03/02/2021 Goal Status: Active Ulcer/skin breakdown will have a volume reduction of 30% by week 4 Date Initiated: 12/07/2020 Date Inactivated: 02/02/2021 Target Resolution Date: 01/13/2021 Goal Status: Met Ulcer/skin breakdown will have a volume reduction of 50% by week 8 Date Initiated: 02/02/2021 Target Resolution Date: 03/02/2021 Goal Status: Active Interventions: Assess patient/caregiver ability to obtain necessary supplies Assess patient/caregiver ability to perform ulcer/skin care regimen upon admission and as needed Assess ulceration(s) every visit Notes: Electronic Signature(s) Signed: 02/02/2021 2:27:43 PM By: Baruch Gouty RN, BSN Entered By: Baruch Gouty on 02/02/2021 08:51:02 -------------------------------------------------------------------------------- Pain Assessment Details Patient Name: Date of Service: [name]. 02/02/2021 8:00 A M Medical Record Number: [id number] Patient Account Number: [id number] Date of Birth/Sex: Treating RN: [birth year] (70 y.o. [name] Primary Care Veronia Laprise: [name] Other Clinician: Referring Deni Lefever: Treating Finnis Colee/Extender: Bartholome Bill in Treatment: 8 Active Problems Location of Pain Severity and Description of Pain Patient Has Paino No Site Locations Pain Management and Medication Current Pain Management: Electronic Signature(s) Signed: 02/02/2021 2:27:43 PM By: Baruch Gouty RN, BSN Signed: 02/05/2021 1:33:39 PM By: Sandre Kitty Entered By: Sandre Kitty on 02/02/2021 08:10:25 -------------------------------------------------------------------------------- Patient/Caregiver Education Details Patient Name: Date of Service: [name] 7/29/2022andnbsp8:00 [place] Record Number: [id number] Patient Account Number: [id number] Date of Birth/Gender: Treating RN: July 07, 1951 (70 y.o. [name] Primary Care Physician: [name] Other  Clinician: Referring Physician: Treating Physician/Extender: Bartholome Bill in Treatment: 8 Education Assessment Education Provided To: Patient Education Topics Provided Venous: Methods: Explain/Verbal Responses: Reinforcements needed, State content correctly Wound/Skin Impairment: Methods: Explain/Verbal Responses: Reinforcements needed, State content correctly Electronic Signature(s) Signed: 02/02/2021 2:27:43 PM By: Baruch Gouty RN, BSN Entered By: Baruch Gouty on 02/02/2021 08:51:59 -------------------------------------------------------------------------------- Wound Assessment Details Patient Name: Date of Service: [name]. 02/02/2021 8:00 A M Medical Record Number: [id number] Patient Account Number: [id number] Date of Birth/Sex: Treating RN: February 08, 1951 (70 y.o. [name] Primary Care Prinston Kynard: [name] Other Clinician: Referring Arvetta Araque: Treating Amarya Kuehl/Extender: Vivia Budge Weeks in Treatment: 8 Wound Status Wound Number: 2 Primary Arterial Insufficiency Ulcer Etiology: Wound Location: Right T Great oe Wound Open Wounding Event: Gradually Appeared Status: Date Acquired: 09/18/2020 Comorbid Coronary Artery Disease, Peripheral Arterial Disease, Weeks Of Treatment: 8 History: Peripheral Venous Disease Clustered Wound: No Photos Wound Measurements Length: (cm)  0.3 Width: (cm) 0.2 Depth: (cm) 0.2 Area: (cm) 0.047 Volume: (cm) 0.009 % Reduction in Area: 97.3% % Reduction in Volume: 98.3% Epithelialization: Large (67-100%) Tunneling: No Undermining: No Wound Description Classification: Full Thickness With Exposed Support Structures Wound Margin: Distinct, outline attached Exudate Amount: Small Exudate Type: Serous Exudate Color: amber Foul Odor After Cleansing: No Slough/Fibrino No Wound Bed Granulation Amount: Large (67-100%) Exposed Structure Granulation Quality: Red,  Pink Fascia Exposed: No Necrotic Amount: Small (1-33%) Fat Layer (Subcutaneous Tissue) Exposed: Yes Necrotic Quality: Eschar Tendon Exposed: No Muscle Exposed: No Joint Exposed: No Bone Exposed: Yes Electronic Signature(s) Signed: 02/02/2021 1:14:47 PM By: Deon Pilling Signed: 02/02/2021 2:27:43 PM By: Baruch Gouty RN, BSN Entered By: Deon Pilling on 02/02/2021 08:30:45 -------------------------------------------------------------------------------- Wound Assessment Details Patient Name: Date of Service: [name]. 02/02/2021 8:00 A M Medical Record Number: [id number] Patient Account Number: [id number] Date of Birth/Sex: Treating RN: 1951-07-08 (70 y.o. [name] Primary Care Thurman Sarver: [name] Other Clinician: Referring Kaveon Blatz: Treating Lincoln Ginley/Extender: Vivia Budge Weeks in Treatment: 8 Wound Status Wound Number: 3 Primary Venous Leg Ulcer Etiology: Wound Location: Right, Distal, Medial Lower Leg Wound Open Wounding Event: Gradually Appeared Status: Date Acquired: 12/11/2020 Comorbid Coronary Artery Disease, Peripheral Arterial Disease, Weeks Of Treatment: 7 History: Peripheral Venous Disease Clustered Wound: No Photos Wound Measurements Length: (cm) 0.3 Width: (cm) 0.2 Depth: (cm) 0.4 Area: (cm) 0.047 Volume: (cm) 0.019 % Reduction in Area: 71.5% % Reduction in Volume: 42.4% Epithelialization: Small (1-33%) Tunneling: Yes Position (o'clock): 12 Maximum Distance: (cm) 1 Undermining: No Wound Description Classification: Full Thickness Without Exposed Support Structures Wound Margin: Distinct, outline attached Exudate Amount: Medium Exudate Type: Serosanguineous Exudate Color: red, brown Foul Odor After Cleansing: No Slough/Fibrino No Wound Bed Granulation Amount: Large (67-100%) Exposed Structure Granulation Quality: Red, Pink Fascia Exposed: No Necrotic Amount: None Present (0%) Fat Layer (Subcutaneous  Tissue) Exposed: Yes Tendon Exposed: No Muscle Exposed: No Joint Exposed: No Bone Exposed: No Electronic Signature(s) Signed: 02/02/2021 1:14:47 PM By: Deon Pilling Signed: 02/02/2021 2:27:43 PM By: Baruch Gouty RN, BSN Entered By: Deon Pilling on 02/02/2021 08:30:59 -------------------------------------------------------------------------------- Vitals Details Patient Name: Date of Service: [name]. 02/02/2021 8:00 A M Medical Record Number: [id number] Patient Account Number: [id number] Date of Birth/Sex: Treating RN: [birth year] (70 y.o. [name] Primary Care Jayliana Valencia: [name] Other Clinician: Referring Ariston Grandison: Treating Jerrye Seebeck/Extender: Bartholome Bill in Treatment: 8 Vital Signs Time Taken: 08:09 Temperature (F): 97.7 Height (in): 72 Pulse (bpm): 80 Weight (lbs): 162 Respiratory Rate (breaths/min): 16 Body Mass Index (BMI): 22 Blood Pressure (mmHg): 117/78 Reference Range: 80 - 120 mg / dl Electronic Signature(s) Signed: 02/05/2021 1:33:39 PM By: Sandre Kitty Entered By: Sandre Kitty on 02/02/2021 08:10:16

## 2021-02-12 DIAGNOSIS — L97509 Non-pressure chronic ulcer of other part of unspecified foot with unspecified severity: Secondary | ICD-10-CM | POA: Diagnosis not present

## 2021-02-12 DIAGNOSIS — E785 Hyperlipidemia, unspecified: Secondary | ICD-10-CM | POA: Diagnosis not present

## 2021-02-12 DIAGNOSIS — R82998 Other abnormal findings in urine: Secondary | ICD-10-CM | POA: Diagnosis not present

## 2021-02-12 DIAGNOSIS — I700 Atherosclerosis of aorta: Secondary | ICD-10-CM | POA: Diagnosis not present

## 2021-02-12 DIAGNOSIS — Z1331 Encounter for screening for depression: Secondary | ICD-10-CM | POA: Diagnosis not present

## 2021-02-12 DIAGNOSIS — L98499 Non-pressure chronic ulcer of skin of other sites with unspecified severity: Secondary | ICD-10-CM | POA: Diagnosis not present

## 2021-02-12 DIAGNOSIS — K4090 Unilateral inguinal hernia, without obstruction or gangrene, not specified as recurrent: Secondary | ICD-10-CM | POA: Diagnosis not present

## 2021-02-12 DIAGNOSIS — I7025 Atherosclerosis of native arteries of other extremities with ulceration: Secondary | ICD-10-CM | POA: Diagnosis not present

## 2021-02-12 DIAGNOSIS — I739 Peripheral vascular disease, unspecified: Secondary | ICD-10-CM | POA: Diagnosis not present

## 2021-02-12 DIAGNOSIS — Z1339 Encounter for screening examination for other mental health and behavioral disorders: Secondary | ICD-10-CM | POA: Diagnosis not present

## 2021-02-12 DIAGNOSIS — J069 Acute upper respiratory infection, unspecified: Secondary | ICD-10-CM | POA: Diagnosis not present

## 2021-02-12 DIAGNOSIS — Z0000 Encounter for general adult medical examination without abnormal findings: Secondary | ICD-10-CM | POA: Diagnosis not present

## 2021-02-12 DIAGNOSIS — D6869 Other thrombophilia: Secondary | ICD-10-CM | POA: Diagnosis not present

## 2021-02-12 DIAGNOSIS — Z95828 Presence of other vascular implants and grafts: Secondary | ICD-10-CM | POA: Diagnosis not present

## 2021-02-12 NOTE — Progress Notes (Signed)
[facility] PROGRESS NOTE  ASSESSMENT / PLAN: [name] is a 70 y.o. 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 His right great toe and right calf continue to improve thanks to the wound center. We will continue watchful waiting. Will observe graft stenosis - I suspect this is spurious based on recent angiogram. Follow up with me in 3 months.   SUBJECTIVE: Continues to do well.  He is exercising.  He is working full time.   OBJECTIVE: BP (!) 142/78 (BP Location: Left Arm, Patient Position: Sitting, Cuff Size: Normal)   Pulse 87   Temp 98.1 F (36.7 C)   Resp 20   Ht 6' (1.829 m)   Wt 163 lb (73.9 kg)   SpO2 99%   BMI 22.11 kg/m   Edema resolved with compression dressing. Right calf wound greatly improved Right great toe tip continues to improve Right foot warm and well perfused  CBC Latest Ref Rng & Units 11/09/2020 11/01/2020 10/16/2020  WBC 4.0 - 10.5 K/uL 7.6 - 12.6(H)  Hemoglobin 13.0 - 17.0 g/dL 12.7(L) 13.3 10.8(L)  Hematocrit 39.0 - 52.0 % 39.4 39.0 32.1(L)  Platelets 150 - 400 K/uL 253 - 197     CMP Latest Ref Rng & Units 01/02/2021 11/09/2020 11/01/2020  Glucose 70 - 99 mg/dL - 86 100(H)  BUN 8 - 23 mg/dL - 19 22  Creatinine 0.61 - 1.24 mg/dL - 0.86 0.80  Sodium 135 - 145 mmol/L - 140 139  Potassium 3.5 - 5.1 mmol/L - 4.1 5.4(H)  Chloride 98 - 111 mmol/L - 103 102  CO2 22 - 32 mmol/L - 28 -  Calcium 8.9 - 10.3 mg/dL - 8.6(L) -  Total Protein 6.0 - 8.5 g/dL 6.7 6.5 -  Total Bilirubin 0.0 - 1.2 mg/dL 0.7 0.4 -  Alkaline Phos 44 - 121 IU/L 114 102 -  AST 0 - 40 IU/L 37 29 -  ALT 0 - 44 IU/L 52(H) 55(H) -   LOWER EXTREMITY DOPPLER STUDY   Patient Name:  [name]  Date of Exam:   02/13/2021  Medical Rec #: [id number]         Accession #:    [id number]  Date of Birth: 13-Feb-1951          Patient Gender: M  Patient Age:   47 years  Exam Location:  [facility]  Procedure:      VAS [place] ABI WITH/WO TBI  Referring Phys: Jamelle Haring    ---------------------------------------------------------------------------  -----     Indications: Peripheral artery disease.     Vascular Interventions: 10/13/20: Right fem - peroneal artery bypass with  in situ                          GSV.                          11/01/20: Janey Genta embolization of the proximal 2 of 3  vein                          branch AVFs.   Comparison Study: 12/05/20   Performing Technologist: [name] RDMS, RVT      Examination Guidelines: A complete evaluation includes at minimum, Doppler  waveform signals and systolic blood pressure reading at the level of  bilateral  brachial, anterior tibial, and posterior tibial arteries, when vessel  segments  are accessible. Bilateral testing is considered an integral part of a  complete  examination. Photoelectric Plethysmograph (PPG) waveforms and toe systolic  pressure readings are included as required and additional duplex testing  as  needed. Limited examinations for reoccurring indications may be performed  as  noted.      ABI Findings:  +---------+------------------+-----+--------+-----------------+  Right    Rt Pressure (mmHg)IndexWaveformComment            +---------+------------------+-----+--------+-----------------+  Brachial 159                                               +---------+------------------+-----+--------+-----------------+  PTA      164               1.03 biphasic                   +---------+------------------+-----+--------+-----------------+  DP       158               0.99 biphasic                   +---------+------------------+-----+--------+-----------------+  Great Toe108               0.68 AbnormalNear normal range  +---------+------------------+-----+--------+-----------------+   +---------+------------------+-----+---------+-------+   Left     Lt Pressure (mmHg)IndexWaveform Comment  +---------+------------------+-----+---------+-------+  PTA      156               0.98 triphasic         +---------+------------------+-----+---------+-------+  DP       145               0.91 triphasic         +---------+------------------+-----+---------+-------+  Great Toe146               0.92 Normal            +---------+------------------+-----+---------+-------+   +-------+-----------+-----------+------------+------------+  ABI/TBIToday's ABIToday's TBIPrevious ABIPrevious TBI  +-------+-----------+-----------+------------+------------+  Right  1.03       0.68       0.95        0.26          +-------+-----------+-----------+------------+------------+  Left   0.98       0.92       1.21        0.9           +-------+-----------+-----------+------------+------------+             Summary:  Right: Resting right ankle-brachial index is within normal range. No  evidence of significant right lower extremity arterial disease. The right  toe-brachial index is abnormal.   Left: Resting left ankle-brachial index is within normal range. No  evidence of significant left lower extremity arterial disease. The left  toe-brachial index is normal.       *See table(s) above for measurements and observations.       Electronically signed by Jamelle Haring on 02/13/2021 at 3:47:40 PM.  [name]. Stanford Breed, MD [facility] Phone Number: [phone] 02/12/2021 4:35 PM

## 2021-02-13 ENCOUNTER — Encounter (HOSPITAL_BASED_OUTPATIENT_CLINIC_OR_DEPARTMENT_OTHER): Payer: PPO | Admitting: Physician Assistant

## 2021-02-13 ENCOUNTER — Other Ambulatory Visit: Payer: Self-pay

## 2021-02-13 ENCOUNTER — Ambulatory Visit (INDEPENDENT_AMBULATORY_CARE_PROVIDER_SITE_OTHER): Payer: PPO | Admitting: Vascular Surgery

## 2021-02-13 ENCOUNTER — Ambulatory Visit (HOSPITAL_COMMUNITY)
Admission: RE | Admit: 2021-02-13 | Discharge: 2021-02-13 | Disposition: A | Discharge status: Home / Self Care | Payer: PPO | Source: Ambulatory Visit | Attending: Vascular Surgery | Admitting: Vascular Surgery

## 2021-02-13 ENCOUNTER — Ambulatory Visit (INDEPENDENT_AMBULATORY_CARE_PROVIDER_SITE_OTHER)
Admission: RE | Admit: 2021-02-13 | Discharge: 2021-02-13 | Disposition: A | Discharge status: Home / Self Care | Payer: PPO | Source: Ambulatory Visit | Attending: Vascular Surgery | Admitting: Vascular Surgery

## 2021-02-13 ENCOUNTER — Encounter: Payer: Self-pay | Admitting: Vascular Surgery

## 2021-02-13 VITALS — BP 142/78 | HR 87 | Temp 98.1°F | Resp 20 | Ht 72.0 in | Wt 163.0 lb

## 2021-02-13 DIAGNOSIS — I739 Peripheral vascular disease, unspecified: Secondary | ICD-10-CM | POA: Insufficient documentation

## 2021-02-13 DIAGNOSIS — L97519 Non-pressure chronic ulcer of other part of right foot with unspecified severity: Secondary | ICD-10-CM | POA: Diagnosis not present

## 2021-02-13 DIAGNOSIS — L97516 Non-pressure chronic ulcer of other part of right foot with bone involvement without evidence of necrosis: Secondary | ICD-10-CM | POA: Diagnosis not present

## 2021-02-13 DIAGNOSIS — L308 Other specified dermatitis: Secondary | ICD-10-CM | POA: Diagnosis not present

## 2021-02-13 DIAGNOSIS — I70235 Atherosclerosis of native arteries of right leg with ulceration of other part of foot: Secondary | ICD-10-CM | POA: Diagnosis not present

## 2021-02-13 NOTE — Progress Notes (Addendum)
[name], [name] ([id number]) Visit Report for 02/13/2021 Chief Complaint Document Details Patient Name: Date of Service: [name], [name] 02/13/2021 3:45 PM Medical Record Number: [id number] Patient Account Number: [id number] Date of Birth/Sex: Treating RN: 22-Jul-1950 (70 y.o. [name] Primary Care Provider: Sueanne Jackson Other Clinician: Referring Provider: Treating Provider/Extender: [name] in Treatment: 9 Information Obtained from: Patient Chief Complaint Surgical wound dehiscence of right lower extremity and Great toe wound. Electronic Signature(s) Signed: 02/13/2021 4:57:28 PM By: Wyatt Keeler Jackson Entered By: [name] on 02/13/2021 16:57:28 -------------------------------------------------------------------------------- Debridement Details Patient Name: Date of Service: [name] 02/13/2021 3:45 PM Medical Record Number: [id number] Patient Account Number: [id number] Date of Birth/Sex: Treating RN: April 10, 1951 (70 y.o. [name], [name] Primary Care Provider: Sueanne Jackson Other Clinician: Referring Provider: Treating Provider/Extender: [name] in Treatment: 9 Debridement Performed for Assessment: Wound #2 Right T Great oe Performed By: Physician Wyatt Keeler, PA Debridement Type: Debridement Severity of Tissue Pre Debridement: Necrosis of bone Level of Consciousness (Pre-procedure): Awake and Alert Pre-procedure Verification/Time Out Yes - 17:00 Taken: Start Time: 17:00 Pain Control: Lidocaine T Area Debrided (L x W): otal 0.2 (cm) x 0.2 (cm) = 0.04 (cm) Tissue and other material debrided: Viable, Non-Viable, Bone, Callus, Skin: Dermis , Skin: Epidermis Level: Skin/Subcutaneous Tissue/Muscle/Bone Debridement Description: Excisional Instrument: Curette Bleeding: Minimum Hemostasis Achieved: Pressure End Time: 17:04 Procedural Pain: 0 Post Procedural Pain: 0 Response to  Treatment: Procedure was tolerated well Level of Consciousness (Post- Awake and Alert procedure): Post Debridement Measurements of Total Wound Length: (cm) 0.2 Width: (cm) 0.2 Depth: (cm) 0.1 Volume: (cm) 0.003 Character of Wound/Ulcer Post Debridement: Improved Severity of Tissue Post Debridement: Bone involvement without necrosis Post Procedure Diagnosis Same as Pre-procedure Electronic Signature(s) Signed: 02/13/2021 5:35:30 PM By: Wyatt Hammock RN Signed: 02/15/2021 5:47:57 PM By: Wyatt Keeler Jackson Entered By: [name] on 02/13/2021 17:11:10 -------------------------------------------------------------------------------- HPI Details Patient Name: Date of Service: [name] 02/13/2021 3:45 PM Medical Record Number: [id number] Patient Account Number: [id number] Date of Birth/Sex: Treating RN: 1950-10-30 (70 y.o. [name] Primary Care Provider: Sueanne Jackson Other Clinician: Referring Provider: Treating Provider/Extender: [name] in Treatment: 9 History of Present Illness HPI Description: Admission 6/2 Mr. [name] is a 70 year old male with a past medical history of peripheral arterial disease status post right common femoral to peroneal bypass on 10/13/2020 and coil embolization of bypass fistula x2 on 11/01/2020. He presents today because he has wound dehiscence that occurred 3 weeks ago from his recent vascular surgery. He has been using wet-to-dry dressings to this area. He also has necrotic tissue to the right great toe. This was the original symptom that precipitated further vascular evaluation that led to his aortogram and ultimately bypass graft. He is currently keeping this area covered. He currently denies signs of infection. 6/6; patient presents for 1 week follow-up. He had Kerlix/Coban compression for the past week with Hydrofera Blue underneath. He reports tolerating this well without any issues. He thinks  the wound looks smaller today. He has been using Betadine to the toe wound. He has no complaints today. He denies signs of infection. 6/20; patient presents for 2-week follow-up. He had Kerlix/Coban compression with Hydrofera Blue. He has tolerated this well. He reports significant improvement in leg swelling. He also thinks the wound looks smaller. He has no complaints today and denies signs of infection. 6/27; patient presents for  1 week follow-up. He continues to use Hydrofera Blue under Kerlix/Coban compression and has tolerated this well. He continues to report improvement in leg swelling. He is content with the wound care progress at this time. He denies signs of infection. 7/5; patient presents for 1 week follow-up. He has been using Hydrofera Blue and Santyl under Kerlix/Coban. He reports tolerating this well. He has some mild tenderness to the distal portion of the right leg wound. He denies signs of infection. 7/11; patient presents for 1 week follow-up. He has been using Hydrofera Blue and Santyl under Kerlix/Coban. He has no issues or complaints today. He was started on antibiotics at last clinic visit and reports improvement in tenderness and erythema to the wound. 7/21; patient presents for 1 week follow-up. He has been using Hydrofera Blue and Santyl, silver alginate under Kerlix/Coban. He has no issues or complaints today. He reports improvement T his wound healing. Unfortunately he hit his right great toe and split the callus open a little bit. He denies signs of infection. o 7/29; patient presents for 1 week follow-up. He has tolerated the compression wrap well and has no issues or complaints today. He is going to be out of town next week and will not return until next weekend. He denies signs of infection. 02/14/2021 upon evaluation today patient appears to be doing really about the same in regard to his toe ulcer. Currently he did have repeat studies and evaluation with the  vascular surgeon. The study showed that he has an ABI on the right 1.03 with a TBI of 0.68 and on the left and ABI 0.98 with a TBI of 0.92. Overall this seems to be doing excellent which is great news. With that being said it was noted that the patient does have some need for coil embolization of a persistent fistula in his bypass but that something that is being delayed for now as he is doing quite well. With regard to the dehiscence area there is actually signs of this healing and improving in regard to the leg. In regard to the toe this actually appears to be bone that is actually exposed in the distal portion of the toe. He has not had any x-rays up to this point that I can see. I inquired and he told me he has not gone for x-ray at this point. I think that something more than I need to do although I am going to try to see what I can do about debriding away the necrotic bone that appears to be present in the distal portion of the toe. Electronic Signature(s) Signed: 02/14/2021 7:46:18 AM By: Wyatt Keeler Jackson Entered By: [name] on 02/14/2021 07:46:18 -------------------------------------------------------------------------------- Physical Exam Details Patient Name: Date of Service: [name], [name] 02/13/2021 3:45 PM Medical Record Number: [id number] Patient Account Number: [id number] Date of Birth/Sex: Treating RN: Jan 23, 1951 (70 y.o. [name] Primary Care Provider: Sueanne Jackson Other Clinician: Referring Provider: Treating Provider/Extender: [name] in Treatment: 9 Constitutional Well-nourished and well-hydrated in no acute distress. Respiratory normal breathing without difficulty. Psychiatric this patient is able to make decisions and demonstrates good insight into disease process. Alert and Oriented x 3. pleasant and cooperative. Notes Upon inspection again patient's wound on the leg actually showing signs of some good granulation  epithelization epithelization is actually going down the sidewalls and overall I am very pleased in that regard. In regard to the bone I did actually using a curette debride  away the tip of the bone second on the toe and this was sent for pathology. We will see what this shows my suspicion is probably chronic osteomyelitis but nonetheless we will see. In the meantime I did actually expose good bone tissue underneath it was bleeding but still nonetheless present right at the tip. I think this can be difficult to heal but at the same time much better than where things were with this piece extruding out. Electronic Signature(s) Signed: 02/14/2021 7:47:04 AM By: Wyatt Keeler Jackson Entered By: [name] on 02/14/2021 07:47:04 -------------------------------------------------------------------------------- Physician Orders Details Patient Name: Date of Service: [name], [name] 02/13/2021 3:45 PM Medical Record Number: [id number] Patient Account Number: [id number] Date of Birth/Sex: Treating RN: [birth year] (70 y.o. 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 - Dr. Heber [place] Return A Bathing/ Shower/ Hygiene May shower with protection but do not get wound dressing(s) wet. Edema Control - Lymphedema / SCD / Other Elevate legs to the level of the heart or above for 30 minutes daily and/or when sitting, a frequency of: - throughout the day Avoid standing  for long periods of time. Exercise regularly Additional Orders / Instructions Follow Nutritious Diet Non Wound Condition Other Non Wound Condition Orders/Instructions: - Put small piece of CaAlgAg over healed area on Right Ant. LEG Wound Treatment Wound #2 - T Great oe Wound Laterality: Right Cleanser: Soap and Water 1 x Per Day/7 Days Discharge Instructions: May shower and wash wound with dial antibacterial soap and water prior to dressing change. Prim Dressing: betadine 1 x Per Day/7 Days ary Discharge Instructions: apply daily Secondary Dressing: Woven Gauze Sponge, Non-Sterile 4x4 in 1 x Per Day/7 Days Discharge Instructions: Apply over primary dressing as directed. Secured With: 92M Medipore H Soft Cloth Surgical T 4 x 2 (in/yd) 1 x Per Day/7 Days ape Discharge Instructions: Secure dressing with tape as directed. Wound #3 - Lower Leg Wound Laterality: Right, Medial, Distal Cleanser: Soap and Water 1 x Per Week/7 Days Discharge Instructions: May shower and wash wound with dial antibacterial soap and water prior to dressing change. Cleanser: Wound Cleanser (Generic) 1 x Per Week/7 Days Discharge Instructions: Cleanse the wound with wound cleanser prior to applying a clean dressing using gauze sponges, not tissue or cotton balls. Peri-Wound Care: Sween Lotion (Moisturizing lotion) 1 x Per Week/7 Days Discharge Instructions: Apply moisturizing lotion as directed Prim Dressing: KerraCel Ag Gelling Fiber Dressing, 2x2 in (silver alginate) 1 x Per Week/7 Days ary Discharge Instructions: lightly pack into undermining and wound bed , change every other day after wrap removed Secondary Dressing: Woven Gauze Sponge, Non-Sterile 4x4 in (Generic) 1 x Per Week/7 Days Discharge Instructions: Apply over primary dressing as directed. Compression Wrap: Kerlix Roll 4.5x3.1 (in/yd) (Generic) 1 x Per Week/7 Days Discharge Instructions: Apply Kerlix and Coban compression , remove wrap next Friday or  Saturday. Compression Wrap: Coban Self-Adherent Wrap 4x5 (in/yd) (Generic) 1 x Per Week/7 Days Discharge Instructions: Apply over Kerlix as directed. Radiology X-ray, toes - right great toe Electronic Signature(s) Signed: 02/13/2021 5:35:30 PM By: Wyatt Hammock  RN Signed: 02/15/2021 5:47:57 PM By: Wyatt Keeler Jackson Entered By: [name] on 02/13/2021 17:12:10 Prescription 02/13/2021 -------------------------------------------------------------------------------- Sabra Heck, Jams L. [name] [place] Patient Name: Provider: Nov 25, 1950 [id number] Date of Birth: NPI#: Jerilynn Mages [id number] Sex: DEA #: [id number] Phone #: License #: [place] Patient Address: [address], [place] [address] [place], [place] [address] [phone] Allergies penicillin Provider's Orders X-ray, toes - right great toe Hand Signature: Date(s): Electronic Signature(s) Signed: 02/13/2021 5:35:30 PM By: Wyatt Hammock RN Signed: 02/15/2021 5:47:57 PM By: Wyatt Keeler Jackson Entered By: [name] on 02/13/2021 17:12:10 -------------------------------------------------------------------------------- Problem List Details Patient Name: Date of Service: [name], [name] 02/13/2021 3:45 PM Medical Record Number: [id number] Patient Account Number: [id number] Date of Birth/Sex: Treating RN: [birth year] (70 y.o. 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 with unspecified 12/07/2020 No Yes severity L97.519 Non-pressure chronic ulcer of other part of right foot with  unspecified severity 12/07/2020 No Yes I73.9 Peripheral vascular disease, unspecified 12/07/2020 No Yes Inactive Problems ICD-10 Code Description Active Date Inactive Date T81.30XA Disruption of wound, unspecified, initial encounter 12/07/2020 12/07/2020 Resolved Problems Electronic Signature(s) Signed: 02/13/2021 4:57:22 PM By: Wyatt Keeler Jackson Entered By: [name] on 02/13/2021 16:57:22 -------------------------------------------------------------------------------- Progress Note Details Patient Name: Date of Service: [name], [name] 02/13/2021 3:45 PM Medical Record Number: [id number] Patient Account Number: [id number] Date of Birth/Sex: Treating RN: Sep 26, 1950 (70 y.o. [name] Primary Care Provider: Sueanne Jackson Other Clinician: Referring Provider: Treating Provider/Extender: [name] in Treatment: 9 Subjective Chief Complaint Information obtained from Patient Surgical wound dehiscence of right lower extremity and Great toe wound. History of Present Illness (HPI) Admission 6/2 Mr. Hanh Smelser is a 70 year old male with a past medical history of peripheral arterial disease status post right common femoral to peroneal bypass on 10/13/2020 and coil embolization of bypass fistula x2 on 11/01/2020. He presents today because he has wound dehiscence that occurred 3 weeks ago from his recent vascular surgery. He has been using wet-to-dry dressings to this area. He also has necrotic tissue to the right great toe. This was the original symptom that precipitated further vascular evaluation that led to his aortogram and ultimately bypass graft. He is currently keeping this area covered. He currently denies signs of infection. 6/6; patient presents for 1 week follow-up. He had Kerlix/Coban compression for the past week with Hydrofera Blue underneath. He reports tolerating this well without any issues. He thinks the wound looks smaller today. He has  been using Betadine to the toe wound. He has no complaints today. He denies signs of infection. 6/20; patient presents for 2-week follow-up. He had Kerlix/Coban compression with Hydrofera Blue. He has tolerated this well. He reports significant improvement in leg swelling. He also thinks the wound looks smaller. He has no complaints today and denies signs of infection. 6/27; patient presents for 1 week follow-up. He continues to use Hydrofera Blue under Kerlix/Coban compression and has tolerated this well. He continues to report improvement in leg swelling. He is content with the wound care progress at this time. He denies signs of infection. 7/5; patient presents for 1 week  follow-up. He has been using Hydrofera Blue and Santyl under Kerlix/Coban. He reports tolerating this well. He has some mild tenderness to the distal portion of the right leg wound. He denies signs of infection. 7/11; patient presents for 1 week follow-up. He has been using Hydrofera Blue and Santyl under Kerlix/Coban. He has no issues or complaints today. He was started on antibiotics at last clinic visit and reports improvement in tenderness and erythema to the wound. 7/21; patient presents for 1 week follow-up. He has been using Hydrofera Blue and Santyl, silver alginate under Kerlix/Coban. He has no issues or complaints today. He reports improvement T his wound healing. Unfortunately he hit his right great toe and split the callus open a little bit. He denies signs of infection. o 7/29; patient presents for 1 week follow-up. He has tolerated the compression wrap well and has no issues or complaints today. He is going to be out of town next week and will not return until next weekend. He denies signs of infection. 02/14/2021 upon evaluation today patient appears to be doing really about the same in regard to his toe ulcer. Currently he did have repeat studies and evaluation with the vascular surgeon. The study showed that he  has an ABI on the right 1.03 with a TBI of 0.68 and on the left and ABI 0.98 with a TBI of 0.92. Overall this seems to be doing excellent which is great news. With that being said it was noted that the patient does have some need for coil embolization of a persistent fistula in his bypass but that something that is being delayed for now as he is doing quite well. With regard to the dehiscence area there is actually signs of this healing and improving in regard to the leg. In regard to the toe this actually appears to be bone that is actually exposed in the distal portion of the toe. He has not had any x-rays up to this point that I can see. I inquired and he told me he has not gone for x-ray at this point. I think that something more than I need to do although I am going to try to see what I can do about debriding away the necrotic bone that appears to be present in the distal portion of the toe. Objective Constitutional Well-nourished and well-hydrated in no acute distress. Vitals Time Taken: 4:03 PM, Height: 72 in, Weight: 162 lbs, BMI: 22, Temperature: 97.9 F, Pulse: 81 bpm, Respiratory Rate: 16 breaths/min, Blood Pressure: 133/81 mmHg. Respiratory normal breathing without difficulty. Psychiatric this patient is able to make decisions and demonstrates good insight into disease process. Alert and Oriented x 3. pleasant and cooperative. General Notes: Upon inspection again patient's wound on the leg actually showing signs of some good granulation epithelization epithelization is actually going down the sidewalls and overall I am very pleased in that regard. In regard to the bone I did actually using a curette debride away the tip of the bone second on the toe and this was sent for pathology. We will see what this shows my suspicion is probably chronic osteomyelitis but nonetheless we will see. In the meantime I did actually expose good bone tissue underneath it was bleeding but still  nonetheless present right at the tip. I think this can be difficult to heal but at the same time much better than where things were with this piece extruding out. Integumentary (Hair, Skin) Wound #2 status is Open. Original cause of wound was  Gradually Appeared. The date acquired was: 09/18/2020. The wound has been in treatment 9 weeks. The wound is located on the Right T Great. The wound measures 0.2cm length x 0.2cm width x 0.1cm depth; 0.031cm^2 area and 0.003cm^3 volume. There is oe bone and Fat Layer (Subcutaneous Tissue) exposed. There is no tunneling or undermining noted. There is a small amount of serous drainage noted. The wound margin is distinct with the outline attached to the wound base. There is large (67-100%) red, pink granulation within the wound bed. There is a small (1-33%) amount of necrotic tissue within the wound bed including Eschar. Wound #3 status is Open. Original cause of wound was Gradually Appeared. The date acquired was: 12/11/2020. The wound has been in treatment 9 weeks. The wound is located on the Right,Distal,Medial Lower Leg. The wound measures 0.7cm length x 0.2cm width x 0.4cm depth; 0.11cm^2 area and 0.044cm^3 volume. There is Fat Layer (Subcutaneous Tissue) exposed. There is no undermining noted, however, there is tunneling at 12:00 with a maximum distance of 0.5cm. There is a medium amount of serosanguineous drainage noted. The wound margin is distinct with the outline attached to the wound base. There is large (67- 100%) red, pink granulation within the wound bed. There is no necrotic tissue within the wound bed. 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 There was a Excisional Skin/Subcutaneous Tissue/Muscle/Bone Debridement with a total area of 0.04 sq cm performed by Wyatt Keeler, PA. With the following instrument(s): Curette to remove Viable and Non-Viable tissue/material. Material removed includes Bone,Callus, Skin: Dermis, and Skin: Epidermis after achieving pain control using Lidocaine. No specimens were taken. A time out was conducted at 17:00, prior to the start of the procedure. A Minimum amount of bleeding was controlled with Pressure. The procedure was tolerated well with a pain level of 0 throughout and a pain level of 0 following the procedure. Post Debridement Measurements: 0.2cm length x 0.2cm width x 0.1cm depth; 0.003cm^3 volume. Character of Wound/Ulcer Post Debridement is improved. Severity of Tissue Post Debridement is: Bone involvement without necrosis. Post procedure Diagnosis Wound #2: Same as Pre-Procedure Plan Follow-up Appointments: Return Appointment in 1 week. - Dr. Heber Fil[place] Bathing/ Shower/ Hygiene: May shower with protection but do not get wound dressing(s) wet. Edema Control - Lymphedema / SCD / Other: Elevate legs to the level of the heart or above for 30 minutes daily and/or when sitting, a frequency of: - throughout the day Avoid standing for long periods of time. Exercise regularly Additional Orders / Instructions: Follow Nutritious Diet Non Wound Condition: Other Non Wound Condition Orders/Instructions: - Put small piece of CaAlgAg over healed area on Right Ant. LEG Radiology ordered were: X-ray, toes - right great toe WOUND #2: - T Great Wound Laterality: Right oe Cleanser: Soap and Water 1 x Per Day/7 Days Discharge Instructions: May shower and wash wound with dial antibacterial soap and water prior to dressing change. Prim Dressing: betadine 1 x Per Day/7  Days ary Discharge Instructions: apply daily Secondary Dressing: Woven Gauze Sponge, Non-Sterile 4x4 in 1 x Per Day/7 Days Discharge Instructions: Apply over primary dressing as directed. Secured With: [employer]  Medipore H Soft Cloth Surgical T 4 x 2 (in/yd) 1 x Per Day/7 Days ape Discharge Instructions: Secure dressing with tape as directed. WOUND #3: - Lower Leg Wound Laterality: Right, Medial, Distal Cleanser: Soap and Water 1 x Per Week/7 Days Discharge Instructions: May shower and wash wound with dial antibacterial soap and water prior to dressing change. Cleanser: Wound Cleanser (Generic) 1 x Per Week/7 Days Discharge Instructions: Cleanse the wound with wound cleanser prior to applying a clean dressing using gauze sponges, not tissue or cotton balls. Peri-Wound Care: Sween Lotion (Moisturizing lotion) 1 x Per Week/7 Days Discharge Instructions: Apply moisturizing lotion as directed Prim Dressing: KerraCel Ag Gelling Fiber Dressing, 2x2 in (silver alginate) 1 x Per Week/7 Days ary Discharge Instructions: lightly pack into undermining and wound bed , change every other day after wrap removed Secondary Dressing: Woven Gauze Sponge, Non-Sterile 4x4 in (Generic) 1 x Per Week/7 Days Discharge Instructions: Apply over primary dressing as directed. Com pression Wrap: Kerlix Roll 4.5x3.1 (in/yd) (Generic) 1 x Per Week/7 Days Discharge Instructions: Apply Kerlix and Coban compression , remove wrap next Friday or Saturday. Com pression Wrap: Coban Self-Adherent Wrap 4x5 (in/yd) (Generic) 1 x Per Week/7 Days Discharge Instructions: Apply over Kerlix as directed. 1. Would recommend currently that we go ahead and continue with the silver alginate to the leg although Minna recommend the same thing for the toe as well for the time being. I think that we may need to consider a skin substitute for the toe but right now when I get a x-ray to see if there is any other signs of further bony destruction. Good  news is there does not appear to be any significant evidence of erythema. 2. With regard to the compression wrap and leg were doing a Curlex and Coban wrap at this point. 3. With regard to elevation I still think elevating the leg is good as obviously he can still have some swelling but in general he seems to be doing quite well. Overall my big toe appears that we can get the toe closed so that this will heal and things will be in a much better place for him especially now that he has good blood flow into this leg for the most part. We will see patient back for reevaluation in 1 week here in the clinic. If anything worsens or changes patient will contact our office for additional recommendations. Electronic Signature(s) Signed: 02/14/2021 7:48:02 AM By: Wyatt Keeler Jackson Entered By: [name] on 02/14/2021 07:48:02 -------------------------------------------------------------------------------- SuperBill Details Patient Name: Date of Service: [name], [name] 02/13/2021 Medical Record Number: [id number] Patient Account Number: [id number] Date of Birth/Sex: Treating RN: [birth year] (70 y.o. 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 I73.9 Peripheral vascular disease, unspecified Facility Procedures CPT4 Code: [id number] Description: [id number] - DEB BONE 20 SQ CM/< ICD-10 Diagnosis Description 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 with unspecified Modifier: severity Quantity: 1 Physician Procedures CPT4: Description Modifier Code [id number]  [id number] - WC PHYS LEVEL 4 - EST PT 25 ICD-10 Diagnosis Description T81.30XD Disruption of wound, unspecified, subsequent encounter L97.819 Non-pressure chronic ulcer of other part of right  lower leg with unspecified  severity 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

## 2021-02-14 ENCOUNTER — Other Ambulatory Visit: Payer: Self-pay

## 2021-02-14 DIAGNOSIS — I739 Peripheral vascular disease, unspecified: Secondary | ICD-10-CM

## 2021-02-19 ENCOUNTER — Other Ambulatory Visit: Payer: Self-pay

## 2021-02-19 ENCOUNTER — Encounter (HOSPITAL_BASED_OUTPATIENT_CLINIC_OR_DEPARTMENT_OTHER): Payer: PPO | Admitting: Internal Medicine

## 2021-02-19 ENCOUNTER — Encounter (HOSPITAL_BASED_OUTPATIENT_CLINIC_OR_DEPARTMENT_OTHER): Payer: PPO | Attending: Internal Medicine | Admitting: Internal Medicine

## 2021-02-19 DIAGNOSIS — T8130XD Disruption of wound, unspecified, subsequent encounter: Secondary | ICD-10-CM | POA: Diagnosis not present

## 2021-02-19 DIAGNOSIS — L97519 Non-pressure chronic ulcer of other part of right foot with unspecified severity: Secondary | ICD-10-CM | POA: Diagnosis not present

## 2021-02-19 DIAGNOSIS — I739 Peripheral vascular disease, unspecified: Secondary | ICD-10-CM

## 2021-02-19 DIAGNOSIS — L97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 with unspecified severity: Secondary | ICD-10-CM

## 2021-02-20 NOTE — Progress Notes (Signed)
GANNON, HEINZMAN ([id number]) Visit Report for 02/19/2021 Arrival Information Details Patient Name: Date of Service: [name], [name] 02/19/2021 3:15 PM Medical Record Number: [id number] Patient Account Number: [id number] Date of Birth/Sex: Treating RN: 12/29/1950 (70 y.o. [name], Lauren Primary Care Rodrigues Urbanek: [name] Other Clinician: Referring Mileena Rothenberger: Treating Mellany Dinsmore/Extender: Bartholome Bill in Treatment: 10 Visit Information History Since Last Visit Added or deleted any medications: No Patient Arrived: Ambulatory Any new allergies or adverse reactions: No Arrival Time: 15:26 Had a fall or experienced change in No Accompanied By: self activities of daily living that may affect Transfer Assistance: None risk of falls: Patient Identification Verified: Yes Signs or symptoms of abuse/neglect since last visito No Secondary Verification Process Completed: Yes Hospitalized since last visit: No Patient Requires Transmission-Based Precautions: No Implantable device outside of the clinic excluding No Patient Has Alerts: Yes cellular tissue based products placed in the center Patient Alerts: Patient on Blood Thinner since last visit: ABI R=0.95 Has Dressing in Place as Prescribed: Yes ABI L=1.21 Pain Present Now: No Electronic Signature(s) Signed: 02/20/2021 5:16:57 PM By: Rhae Hammock RN Entered By: Rhae Hammock on 02/19/2021 15:26:43 -------------------------------------------------------------------------------- Clinic Level of Care Assessment Details Patient Name: Date of Service: [name], [name] 02/19/2021 3:15 PM Medical Record Number: [id number] Patient Account Number: [id number] Date of Birth/Sex: Treating RN: 26-Mar-1951 (70 y.o. [name] Primary Care Morgin Halls: [name] Other Clinician: Referring Jalaiya Oyster: Treating Francis Yardley/Extender: Bartholome Bill in Treatment: 10 Clinic Level of Care  Assessment Items TOOL 4 Quantity Score X- 1 0 Use when only an EandM is performed on FOLLOW-UP visit ASSESSMENTS - Nursing Assessment / Reassessment X- 1 10 Reassessment of Co-morbidities (includes updates in patient status) X- 1 5 Reassessment of Adherence to Treatment Plan ASSESSMENTS - Wound and Skin A ssessment / Reassessment '[]'  - 0 Simple Wound Assessment / Reassessment - one wound X- 2 5 Complex Wound Assessment / Reassessment - multiple wounds '[]'  - 0 Dermatologic / Skin Assessment (not related to wound area) ASSESSMENTS - Focused Assessment '[]'  - 0 Circumferential Edema Measurements - multi extremities '[]'  - 0 Nutritional Assessment / Counseling / Intervention '[]'  - 0 Lower Extremity Assessment (monofilament, tuning fork, pulses) '[]'  - 0 Peripheral Arterial Disease Assessment (using hand held doppler) ASSESSMENTS - Ostomy and/or Continence Assessment and Care '[]'  - 0 Incontinence Assessment and Management '[]'  - 0 Ostomy Care Assessment and Management (repouching, etc.) PROCESS - Coordination of Care '[]'  - 0 Simple Patient / Family Education for ongoing care X- 1 20 Complex (extensive) Patient / Family Education for ongoing care '[]'  - 0 Staff obtains Programmer, systems, Records, T Results / Process Orders est '[]'  - 0 Staff telephones HHA, Nursing Homes / Clarify orders / etc '[]'  - 0 Routine Transfer to another Facility (non-emergent condition) '[]'  - 0 Routine Hospital Admission (non-emergent condition) '[]'  - 0 New Admissions / Biomedical engineer / Ordering NPWT Apligraf, etc. , '[]'  - 0 Emergency Hospital Admission (emergent condition) '[]'  - 0 Simple Discharge Coordination '[]'  - 0 Complex (extensive) Discharge Coordination PROCESS - Special Needs '[]'  - 0 Pediatric / Minor Patient Management '[]'  - 0 Isolation Patient Management '[]'  - 0 Hearing / Language / Visual special needs '[]'  - 0 Assessment of Community assistance (transportation, D/C planning, etc.) '[]'  - 0 Additional  assistance / Altered mentation '[]'  - 0 Support Surface(s) Assessment (bed, cushion, seat, etc.) INTERVENTIONS - Wound Cleansing / Measurement '[]'  - 0 Simple Wound Cleansing - one wound X- 2 5  Complex Wound Cleansing - multiple wounds X- 1 5 Wound Imaging (photographs - any number of wounds) '[]'  - 0 Wound Tracing (instead of photographs) '[]'  - 0 Simple Wound Measurement - one wound X- 2 5 Complex Wound Measurement - multiple wounds INTERVENTIONS - Wound Dressings X - Small Wound Dressing one or multiple wounds 1 10 '[]'  - 0 Medium Wound Dressing one or multiple wounds X- 1 20 Large Wound Dressing one or multiple wounds '[]'  - 0 Application of Medications - topical '[]'  - 0 Application of Medications - injection INTERVENTIONS - Miscellaneous '[]'  - 0 External ear exam '[]'  - 0 Specimen Collection (cultures, biopsies, blood, body fluids, etc.) '[]'  - 0 Specimen(s) / Culture(s) sent or taken to Lab for analysis '[]'  - 0 Patient Transfer (multiple staff / Civil Service fast streamer / Similar devices) '[]'  - 0 Simple Staple / Suture removal (25 or less) '[]'  - 0 Complex Staple / Suture removal (26 or more) '[]'  - 0 Hypo / Hyperglycemic Management (close monitor of Blood Glucose) '[]'  - 0 Ankle / Brachial Index (ABI) - do not check if billed separately X- 1 5 Vital Signs Has the patient been seen at the hospital within the last three years: Yes Total Score: 105 Level Of Care: New/Established - Level 3 Electronic Signature(s) Signed: 02/19/2021 5:36:52 PM By: [name] Entered By: [name] on 02/19/2021 16:22:53 -------------------------------------------------------------------------------- Encounter Discharge Information Details Patient Name: Date of Service: [name] 02/19/2021 3:15 PM Medical Record Number: [id number] Patient Account Number: [id number] Date of Birth/Sex: Treating RN: 1951-06-11 (70 y.o. [name] Primary Care Sarayu Prevost: [name] Other Clinician: Referring  Mirta Mally: Treating Hamlin Devine/Extender: Bartholome Bill in Treatment: 10 Encounter Discharge Information Items Discharge Condition: Stable Ambulatory Status: Ambulatory Discharge Destination: Home Transportation: Private Auto Accompanied By: alone Schedule Follow-up Appointment: Yes Clinical Summary of Care: Patient Declined Electronic Signature(s) Signed: 02/20/2021 5:48:55 PM By: Levan Hurst RN, BSN Entered By: Levan Hurst on 02/19/2021 17:21:05 -------------------------------------------------------------------------------- Lower Extremity Assessment Details Patient Name: Date of Service: UNKNOWN, SCHLEYER 02/19/2021 3:15 PM Medical Record Number: [id number] Patient Account Number: [id number] Date of Birth/Sex: Treating RN: 07-20-1950 (70 y.o. [name], Lauren Primary Care Davien Malone: [name] Other Clinician: Referring Harini Dearmond: Treating Makaiyah Schweiger/Extender: Vivia Budge Weeks in Treatment: 10 Edema Assessment Assessed: Shirlyn Goltz: No] Patrice Paradise: Yes] Edema: [Left: Ye] [Right: s] Calf Left: Right: Point of Measurement: From Medial Instep 38 cm Ankle Left: Right: Point of Measurement: From Medial Instep 25 cm Vascular Assessment Pulses: Dorsalis Pedis Palpable: [Right:Yes] Posterior Tibial Palpable: [Right:Yes] Electronic Signature(s) Signed: 02/20/2021 5:16:57 PM By: Rhae Hammock RN Entered By: Rhae Hammock on 02/19/2021 15:34:58 -------------------------------------------------------------------------------- Multi Wound Chart Details Patient Name: Date of Service: [name] 02/19/2021 3:15 PM Medical Record Number: [id number] Patient Account Number: [id number] Date of Birth/Sex: Treating RN: 28-Apr-1951 (70 y.o. [name] Primary Care Taylah Dubiel: [name] Other Clinician: Referring Catha Ontko: Treating Tallie Hevia/Extender: Vivia Budge Weeks in Treatment: 10 Vital  Signs Height(in): 72 Pulse(bpm): 32 Weight(lbs): 162 Blood Pressure(mmHg): 144/87 Body Mass Index(BMI): 22 Temperature(F): 97.7 Respiratory Rate(breaths/min): 17 Photos: [[name]:[name]] Right T Great oe Right, Distal, Medial Lower Leg [name] Wound Location: Gradually Appeared Gradually Appeared [name] Wounding Event: Arterial Insufficiency Ulcer Venous Leg Ulcer [name] Primary Etiology: Coronary Artery Disease, Peripheral Coronary Artery Disease, Peripheral [name] Comorbid History: Arterial Disease, Peripheral Venous Arterial Disease, Peripheral Venous Disease Disease 09/18/2020 12/11/2020 [name] Date Acquired: 10 10 [name] Weeks of Treatment: Open Open [name] Wound Status: 0.5x0.5x0.1 0.6x0.3x0.6 [name]  Measurements L x W x D (cm) 0.196 0.141 [name] A (cm) : rea 0.02 0.085 [name] Volume (cm) : 88.70% 14.50% [name] % Reduction in A rea: 96.20% -157.60% [name] % Reduction in Volume: 10 Starting Position 1 (o'clock): 2 Ending Position 1 (o'clock): 0.6 Maximum Distance 1 (cm): No Yes [name] Undermining: Full Thickness With Exposed Support Full Thickness Without Exposed [name] Classification: Structures Support Structures Small Medium [name] Exudate A mount: Serous Serosanguineous [name] Exudate Type: amber red, brown [name] Exudate Color: Distinct, outline attached Distinct, outline attached [name] Wound Margin: Large (67-100%) Large (67-100%) [name] Granulation Amount: Red, Pink Red, Pink [name] Granulation Quality: Small (1-33%) None Present (0%) [name] Necrotic Amount: Eschar [name] [name] Necrotic Tissue: Fat Layer (Subcutaneous Tissue): Yes Fat Layer (Subcutaneous Tissue): Yes [name] Exposed Structures: Bone: Yes Fascia: No Fascia: No Tendon: No Tendon: No Muscle: No Muscle: No Joint: No Joint: No Bone: No Large (67-100%) Small (1-33%) [name] Epithelialization: Treatment Notes Wound #2 (Toe Great) Wound Laterality: Right Cleanser Soap and Water Discharge Instruction: May shower and wash wound with dial  antibacterial soap and water prior to dressing change. Wound Cleanser Discharge Instruction: Cleanse the wound with wound cleanser prior to applying a clean dressing using gauze sponges, not tissue or cotton balls. Peri-Wound Care Topical Primary Dressing Betadine Discharge Instruction: apply daily Secondary Dressing Woven Gauze Sponge, Non-Sterile 4x4 in Discharge Instruction: Apply over primary dressing as directed. Secured With 58M Medipore H Soft Cloth Surgical T 4 x 2 (in/yd) ape Discharge Instruction: Secure dressing with tape as directed. Compression Wrap Compression Stockings Add-Ons Wound #3 (Lower Leg) Wound Laterality: Right, Medial, Distal Cleanser Soap and Water Discharge Instruction: May shower and wash wound with dial antibacterial soap and water prior to dressing change. Wound Cleanser Discharge Instruction: Cleanse the wound with wound cleanser prior to applying a clean dressing using gauze sponges, not tissue or cotton balls. Peri-Wound Care Sween Lotion (Moisturizing lotion) Discharge Instruction: Apply moisturizing lotion as directed Topical Primary Dressing KerraCel Ag Gelling Fiber Dressing, 2x2 in (silver alginate) Discharge Instruction: lightly pack into undermining and wound bed , change every other day after wrap removed Secondary Dressing Woven Gauze Sponge, Non-Sterile 4x4 in Discharge Instruction: Apply over primary dressing as directed. Secured With Compression Wrap Kerlix Roll 4.5x3.1 (in/yd) Discharge Instruction: Apply Kerlix and Coban compression , remove wrap next Friday or Saturday. Coban Self-Adherent Wrap 4x5 (in/yd) Discharge Instruction: Apply over Kerlix as directed. Compression Stockings Add-Ons Electronic Signature(s) Signed: 02/20/2021 4:37:55 PM By: Kalman Shan DO Signed: 02/20/2021 5:08:22 PM By: [name] Entered By: Kalman Shan on 02/20/2021  16:22:13 -------------------------------------------------------------------------------- Multi-Disciplinary Care Plan Details Patient Name: Date of Service: [name], [name] 02/19/2021 3:15 PM Medical Record Number: [id number] Patient Account Number: [id number] Date of Birth/Sex: Treating RN: June 20, 1951 (70 y.o. [name] Primary Care Majd Tissue: [name] Other Clinician: Referring Grier Vu: Treating Lacresia Darwish/Extender: Bartholome Bill in Treatment: 10 Active Inactive Wound/Skin Impairment Nursing Diagnoses: Impaired tissue integrity Knowledge deficit related to ulceration/compromised skin integrity Goals: Patient will have a decrease in wound volume by X% from date: (specify in notes) Date Initiated: 12/07/2020 Date Inactivated: 02/02/2021 Target Resolution Date: 03/02/2021 Goal Status: Met Patient/caregiver will verbalize understanding of skin care regimen Date Initiated: 12/07/2020 Target Resolution Date: 03/02/2021 Goal Status: Active Ulcer/skin breakdown will have a volume reduction of 30% by week 4 Date Initiated: 12/07/2020 Date Inactivated: 02/02/2021 Target Resolution Date: 01/13/2021 Goal Status: Met Ulcer/skin breakdown will have a volume reduction of 50% by week 8 Date Initiated: 02/02/2021  Target Resolution Date: 03/02/2021 Goal Status: Active Interventions: Assess patient/caregiver ability to obtain necessary supplies Assess patient/caregiver ability to perform ulcer/skin care regimen upon admission and as needed Assess ulceration(s) every visit Notes: Electronic Signature(s) Signed: 02/19/2021 3:40:43 PM By: [name] Entered By: [name] on 02/19/2021 15:40:42 -------------------------------------------------------------------------------- Pain Assessment Details Patient Name: Date of Service: [name], [name] 02/19/2021 3:15 PM Medical Record Number: [id number] Patient Account Number: [id number] Date of  Birth/Sex: Treating RN: Jun 25, 1951 (70 y.o. [name], Lauren Primary Care Leshay Desaulniers: [name] Other Clinician: Referring Emil Klassen: Treating Celene Pippins/Extender: Bartholome Bill in Treatment: 10 Active Problems Location of Pain Severity and Description of Pain Patient Has Paino No Site Locations Pain Management and Medication Current Pain Management: Electronic Signature(s) Signed: 02/20/2021 5:16:57 PM By: Rhae Hammock RN Entered By: Rhae Hammock on 02/19/2021 15:28:28 -------------------------------------------------------------------------------- Patient/Caregiver Education Details Patient Name: Date of Service: [name] 8/15/2022andnbsp3:15 PM Medical Record Number: [id number] Patient Account Number: [id number] Date of Birth/Gender: Treating RN: 02-27-1951 (70 y.o. [name] Primary Care Physician: [name] Other Clinician: Referring Physician: Treating Physician/Extender: Bartholome Bill in Treatment: 10 Education Assessment Education Provided To: Patient Education Topics Provided Venous: Methods: Explain/Verbal, Printed Responses: State content correctly Wound/Skin Impairment: Methods: Explain/Verbal, Printed Responses: State content correctly Electronic Signature(s) Signed: 02/19/2021 5:36:52 PM By: [name] Entered By: [name] on 02/19/2021 15:41:13 -------------------------------------------------------------------------------- Wound Assessment Details Patient Name: Date of Service: [name], [name] 02/19/2021 3:15 PM Medical Record Number: [id number] Patient Account Number: [id number] Date of Birth/Sex: Treating RN: 02-12-1951 (70 y.o. [name], Lauren Primary Care Patrina Andreas: [name] Other Clinician: Referring Chavela Justiniano: Treating Amauri Keefe/Extender: Vivia Budge Weeks in Treatment: 10 Wound Status Wound Number: 2 Primary Arterial  Insufficiency Ulcer Etiology: Wound Location: Right T Great oe Wound Open Wounding Event: Gradually Appeared Status: Date Acquired: 09/18/2020 Comorbid Coronary Artery Disease, Peripheral Arterial Disease, Weeks Of Treatment: 10 History: Peripheral Venous Disease Clustered Wound: No Photos Wound Measurements Length: (cm) 0.5 Width: (cm) 0.5 Depth: (cm) 0.1 Area: (cm) 0.196 Volume: (cm) 0.02 % Reduction in Area: 88.7% % Reduction in Volume: 96.2% Epithelialization: Large (67-100%) Tunneling: No Undermining: No Wound Description Classification: Full Thickness With Exposed Support Structures Wound Margin: Distinct, outline attached Exudate Amount: Small Exudate Type: Serous Exudate Color: amber Foul Odor After Cleansing: No Slough/Fibrino No Wound Bed Granulation Amount: Large (67-100%) Exposed Structure Granulation Quality: Red, Pink Fascia Exposed: No Necrotic Amount: Small (1-33%) Fat Layer (Subcutaneous Tissue) Exposed: Yes Necrotic Quality: Eschar Tendon Exposed: No Muscle Exposed: No Joint Exposed: No Bone Exposed: Yes Treatment Notes Wound #2 (Toe Great) Wound Laterality: Right Cleanser Soap and Water Discharge Instruction: May shower and wash wound with dial antibacterial soap and water prior to dressing change. Wound Cleanser Discharge Instruction: Cleanse the wound with wound cleanser prior to applying a clean dressing using gauze sponges, not tissue or cotton balls. Peri-Wound Care Topical Primary Dressing Betadine Discharge Instruction: apply daily Secondary Dressing Woven Gauze Sponge, Non-Sterile 4x4 in Discharge Instruction: Apply over primary dressing as directed. Secured With 38M Medipore H Soft Cloth Surgical T 4 x 2 (in/yd) ape Discharge Instruction: Secure dressing with tape as directed. Compression Wrap Compression Stockings Add-Ons Electronic Signature(s) Signed: 02/20/2021 5:16:57 PM By: Rhae Hammock RN Entered By: Rhae Hammock on 02/19/2021 15:41:48 -------------------------------------------------------------------------------- Wound Assessment Details Patient Name: Date of Service: [name], [name] 02/19/2021 3:15 PM Medical Record Number: [id number] Patient Account Number: [id number] Date of Birth/Sex: Treating RN: Oct 14, 1950 (70 y.o.  [name], Lauren Primary Care Torsha Lemus: [name] Other Clinician: Referring Weber Monnier: Treating Stelios Kirby/Extender: Vivia Budge Weeks in Treatment: 10 Wound Status Wound Number: 3 Primary Venous Leg Ulcer Etiology: Wound Location: Right, Distal, Medial Lower Leg Wound Open Wounding Event: Gradually Appeared Status: Date Acquired: 12/11/2020 Comorbid Coronary Artery Disease, Peripheral Arterial Disease, Weeks Of Treatment: 10 History: Peripheral Venous Disease Clustered Wound: No Photos Wound Measurements Length: (cm) 0.6 Width: (cm) 0.3 Depth: (cm) 0.6 Area: (cm) 0.141 Volume: (cm) 0.085 % Reduction in Area: 14.5% % Reduction in Volume: -157.6% Epithelialization: Small (1-33%) Tunneling: No Undermining: Yes Starting Position (o'clock): 10 Ending Position (o'clock): 2 Maximum Distance: (cm) 0.6 Wound Description Classification: Full Thickness Without Exposed Support Structures Wound Margin: Distinct, outline attached Exudate Amount: Medium Exudate Type: Serosanguineous Exudate Color: red, brown Foul Odor After Cleansing: No Slough/Fibrino No Wound Bed Granulation Amount: Large (67-100%) Exposed Structure Granulation Quality: Red, Pink Fascia Exposed: No Necrotic Amount: None Present (0%) Fat Layer (Subcutaneous Tissue) Exposed: Yes Tendon Exposed: No Muscle Exposed: No Joint Exposed: No Bone Exposed: No Treatment Notes Wound #3 (Lower Leg) Wound Laterality: Right, Medial, Distal Cleanser Soap and Water Discharge Instruction: May shower and wash wound with dial antibacterial soap and water prior to dressing  change. Wound Cleanser Discharge Instruction: Cleanse the wound with wound cleanser prior to applying a clean dressing using gauze sponges, not tissue or cotton balls. Peri-Wound Care Sween Lotion (Moisturizing lotion) Discharge Instruction: Apply moisturizing lotion as directed Topical Primary Dressing KerraCel Ag Gelling Fiber Dressing, 2x2 in (silver alginate) Discharge Instruction: lightly pack into undermining and wound bed , change every other day after wrap removed Secondary Dressing Woven Gauze Sponge, Non-Sterile 4x4 in Discharge Instruction: Apply over primary dressing as directed. Secured With Compression Wrap Kerlix Roll 4.5x3.1 (in/yd) Discharge Instruction: Apply Kerlix and Coban compression , remove wrap next Friday or Saturday. Coban Self-Adherent Wrap 4x5 (in/yd) Discharge Instruction: Apply over Kerlix as directed. Compression Stockings Add-Ons Electronic Signature(s) Signed: 02/20/2021 5:16:57 PM By: Rhae Hammock RN Entered By: Rhae Hammock on 02/19/2021 15:42:30 -------------------------------------------------------------------------------- Vitals Details Patient Name: Date of Service: [name] 02/19/2021 3:15 PM Medical Record Number: [id number] Patient Account Number: [id number] Date of Birth/Sex: Treating RN: 03-Apr-1951 (70 y.o. [name], Lauren Primary Care Jaelah Hauth: [name] Other Clinician: Referring Javarion Douty: Treating Sanayah Munro/Extender: Bartholome Bill in Treatment: 10 Vital Signs Time Taken: 15:26 Temperature (F): 97.7 Height (in): 72 Pulse (bpm): 69 Weight (lbs): 162 Respiratory Rate (breaths/min): 17 Body Mass Index (BMI): 22 Blood Pressure (mmHg): 144/87 Reference Range: 80 - 120 mg / dl Electronic Signature(s) Signed: 02/20/2021 5:16:57 PM By: Rhae Hammock RN Entered By: Rhae Hammock on 02/19/2021 15:28:22

## 2021-02-20 NOTE — Progress Notes (Signed)
ALICE, VITELLI ([id number]) Visit Report for 02/13/2021 Arrival Information Details Patient Name: Date of Service: DRAYVEN, MARCHENA 02/13/2021 3:45 PM Medical Record Number: [id number] Patient Account Number: [id number] Date of Birth/Sex: Treating RN: Feb 02, 1951 (70 y.o. [name], Lauren Primary Care Bubba Vanbenschoten: [name] Other Clinician: Referring Joshalyn Ancheta: Treating Quanita Barona/Extender: Sondra Come in Treatment: 9 Visit Information History Since Last Visit All ordered tests and consults were completed: Yes Patient Arrived: Ambulatory Added or deleted any medications: No Arrival Time: 16:02 Any new allergies or adverse reactions: No Accompanied By: self Had a fall or experienced change in No Transfer Assistance: None activities of daily living that may affect Patient Identification Verified: Yes risk of falls: Secondary Verification Process Completed: Yes Signs or symptoms of abuse/neglect since last visito No Patient Requires Transmission-Based Precautions: No Hospitalized since last visit: No Patient Has Alerts: Yes Implantable device outside of the clinic excluding No Patient Alerts: Patient on Blood Thinner cellular tissue based products placed in the center ABI R=0.95 since last visit: ABI L=1.21 Has Dressing in Place as Prescribed: Yes Pain Present Now: No Notes ABI, arterial studies, and vascular surgeon today. Notes to be provided. Electronic Signature(s) Signed: 02/13/2021 5:41:18 PM By: Deon Pilling Entered By: Deon Pilling on 02/13/2021 16:18:58 -------------------------------------------------------------------------------- Encounter Discharge Information Details Patient Name: Date of Service: JUSTUN, ANAYA 02/13/2021 3:45 PM Medical Record Number: [id number] Patient Account Number: [id number] Date of Birth/Sex: Treating RN: Oct 29, 1950 (70 y.o. [name] Primary Care Duval Macleod: [name] Other Clinician: Referring  Chimene Salo: Treating Cartez Mogle/Extender: Sondra Come in Treatment: 9 Encounter Discharge Information Items Discharge Condition: Stable Ambulatory Status: Ambulatory Discharge Destination: Home Transportation: Private Auto Schedule Follow-up Appointment: Yes Clinical Summary of Care: Provided on 02/13/2021 Form Type Recipient Paper Patient Patient Electronic Signature(s) Signed: 02/13/2021 5:05:05 PM By: [name] Entered By: [name] on 02/13/2021 17:05:05 -------------------------------------------------------------------------------- Lower Extremity Assessment Details Patient Name: Date of Service: ROQUE, SCHILL 02/13/2021 3:45 PM Medical Record Number: [id number] Patient Account Number: [id number] Date of Birth/Sex: Treating RN: December 28, 1950 (70 y.o. [name] Primary Care Ysmael Hires: [name] Other Clinician: Referring Amiere Cawley: Treating Bethzy Hauck/Extender: Donnamae Jude Weeks in Treatment: 9 Edema Assessment Assessed: [Left: No] Patrice Paradise: Yes] Edema: [Left: Ye] [Right: s] Calf Left: Right: Point of Measurement: From Medial Instep 32 cm Ankle Left: Right: Point of Measurement: From Medial Instep 24 cm Vascular Assessment Pulses: Dorsalis Pedis Palpable: [Right:Yes] Electronic Signature(s) Signed: 02/13/2021 5:41:18 PM By: Deon Pilling Entered By: Deon Pilling on 02/13/2021 16:13:17 -------------------------------------------------------------------------------- Multi-Disciplinary Care Plan Details Patient Name: Date of Service: [name] 02/13/2021 3:45 PM Medical Record Number: [id number] Patient Account Number: [id number] Date of Birth/Sex: Treating RN: 21-Feb-1951 (70 y.o. [name], Lauren Primary Care Ladarrius Bogdanski: [name] Other Clinician: Referring Samanthamarie Ezzell: Treating Javian Nudd/Extender: Sondra Come in Treatment: 9 Active Inactive Wound/Skin Impairment Nursing  Diagnoses: Impaired tissue integrity Knowledge deficit related to ulceration/compromised skin integrity Goals: Patient will have a decrease in wound volume by X% from date: (specify in notes) Date Initiated: 12/07/2020 Date Inactivated: 02/02/2021 Target Resolution Date: 03/02/2021 Goal Status: Met Patient/caregiver will verbalize understanding of skin care regimen Date Initiated: 12/07/2020 Target Resolution Date: 03/02/2021 Goal Status: Active Ulcer/skin breakdown will have a volume reduction of 30% by week 4 Date Initiated: 12/07/2020 Date Inactivated: 02/02/2021 Target Resolution Date: 01/13/2021 Goal Status: Met Ulcer/skin breakdown will have a volume reduction of 50% by week 8 Date Initiated: 02/02/2021 Target Resolution  Date: 03/02/2021 Goal Status: Active Interventions: Assess patient/caregiver ability to obtain necessary supplies Assess patient/caregiver ability to perform ulcer/skin care regimen upon admission and as needed Assess ulceration(s) every visit Notes: Electronic Signature(s) Signed: 02/13/2021 5:35:30 PM By: Rhae Hammock RN Entered By: Rhae Hammock on 02/13/2021 17:02:41 -------------------------------------------------------------------------------- Pain Assessment Details Patient Name: Date of Service: [name] 02/13/2021 3:45 PM Medical Record Number: [id number] Patient Account Number: [id number] Date of Birth/Sex: Treating RN: 10/06/1950 (70 y.o. [name], Lauren Primary Care Peggi Yono: [name] Other Clinician: Referring Karim Aiello: Treating Erric Machnik/Extender: Sondra Come in Treatment: 9 Active Problems Location of Pain Severity and Description of Pain Patient Has Paino No Site Locations Pain Management and Medication Current Pain Management: Electronic Signature(s) Signed: 02/13/2021 5:35:30 PM By: Rhae Hammock RN Signed: 02/20/2021 11:33:27 AM By: Sandre Kitty Entered By: Sandre Kitty on  02/13/2021 16:03:51 -------------------------------------------------------------------------------- Patient/Caregiver Education Details Patient Name: Date of Service: [name] 8/9/2022andnbsp3:45 PM Medical Record Number: [id number] Patient Account Number: [id number] Date of Birth/Gender: Treating RN: [birth year] (70 y.o. Erie Noe Primary Care Physician: [name] Other Clinician: Referring Physician: Treating Physician/Extender: Sondra Come in Treatment: 9 Education Assessment Education Provided To: Patient Education Topics Provided Wound/Skin Impairment: Methods: Explain/Verbal Responses: State content correctly [employer]) Signed: 02/13/2021 5:35:30 PM By: Rhae Hammock RN Entered By: Rhae Hammock on 02/13/2021 17:02:52 -------------------------------------------------------------------------------- Wound Assessment Details Patient Name: Date of Service: TRAMAINE, SAULS 02/13/2021 3:45 PM Medical Record Number: [id number] Patient Account Number: [id number] Date of Birth/Sex: Treating RN: 1951-04-20 (70 y.o. [name], Lauren Primary Care Jeanetta Alonzo: [name] Other Clinician: Referring Marijke Guadiana: Treating Robie Mcniel/Extender: Sondra Come in Treatment: 9 Wound Status Wound Number: 2 Primary Arterial Insufficiency Ulcer Etiology: Wound Location: Right T Great oe Wound Open Wounding Event: Gradually Appeared Status: Date Acquired: 09/18/2020 Comorbid Coronary Artery Disease, Peripheral Arterial Disease, Weeks Of Treatment: 9 History: Peripheral Venous Disease Clustered Wound: No Photos Photo Uploaded By: Donavan Burnet on 02/19/2021 14:24:35 Wound Measurements Length: (cm) 0.2 Width: (cm) 0.2 Depth: (cm) 0.1 Area: (cm) 0.031 Volume: (cm) 0.003 % Reduction in Area: 98.2% % Reduction in Volume: 99.4% Epithelialization: Large (67-100%) Tunneling:  No Undermining: No Wound Description Classification: Full Thickness With Exposed Support Structures Wound Margin: Distinct, outline attached Exudate Amount: Small Exudate Type: Serous Exudate Color: amber Foul Odor After Cleansing: No Slough/Fibrino No Wound Bed Granulation Amount: Large (67-100%) Exposed Structure Granulation Quality: Red, Pink Fascia Exposed: No Necrotic Amount: Small (1-33%) Fat Layer (Subcutaneous Tissue) Exposed: Yes Necrotic Quality: Eschar Tendon Exposed: No Muscle Exposed: No Joint Exposed: No Bone Exposed: Yes Electronic Signature(s) Signed: 02/13/2021 5:35:30 PM By: Rhae Hammock RN Signed: 02/13/2021 5:41:18 PM By: Deon Pilling Entered By: Deon Pilling on 02/13/2021 16:13:37 -------------------------------------------------------------------------------- Wound Assessment Details Patient Name: Date of Service: [name] 02/13/2021 3:45 PM Medical Record Number: [id number] Patient Account Number: [id number] Date of Birth/Sex: Treating RN: 1951-05-21 (70 y.o. [name], Lauren Primary Care Anayah Arvanitis: [name] Other Clinician: Referring Bowyn Mercier: Treating Janiqua Friscia/Extender: Sondra Come in Treatment: 9 Wound Status Wound Number: 3 Primary Venous Leg Ulcer Etiology: Wound Location: Right, Distal, Medial Lower Leg Wound Open Wounding Event: Gradually Appeared Status: Date Acquired: 12/11/2020 Comorbid Coronary Artery Disease, Peripheral Arterial Disease, Weeks Of Treatment: 9 History: Peripheral Venous Disease Clustered Wound: No Photos Photo Uploaded By: Donavan Burnet on 02/19/2021 14:24:35 Wound Measurements Length: (cm) 0.7 Width: (cm) 0.2 Depth: (cm) 0.4 Area: (cm) 0.11 Volume: (  cm) 0.044 % Reduction in Area: 33.3% % Reduction in Volume: -33.3% Epithelialization: Small (1-33%) Tunneling: Yes Position (o'clock): 12 Maximum Distance: (cm) 0.5 Undermining: No Wound  Description Classification: Full Thickness Without Exposed Support Structures Wound Margin: Distinct, outline attached Exudate Amount: Medium Exudate Type: Serosanguineous Exudate Color: red, brown Foul Odor After Cleansing: No Slough/Fibrino No Wound Bed Granulation Amount: Large (67-100%) Exposed Structure Granulation Quality: Red, Pink Fascia Exposed: No Necrotic Amount: None Present (0%) Fat Layer (Subcutaneous Tissue) Exposed: Yes Tendon Exposed: No Muscle Exposed: No Joint Exposed: No Bone Exposed: No Electronic Signature(s) Signed: 02/13/2021 5:35:30 PM By: Rhae Hammock RN Signed: 02/13/2021 5:41:18 PM By: Deon Pilling Entered By: Deon Pilling on 02/13/2021 16:14:26 -------------------------------------------------------------------------------- Vitals Details Patient Name: Date of Service: [name] 02/13/2021 3:45 PM Medical Record Number: [id number] Patient Account Number: [id number] Date of Birth/Sex: Treating RN: May 17, 1951 (70 y.o. [name], Lauren Primary Care Lucina Betty: [name] Other Clinician: Referring Lanny Lipkin: Treating Maddux Vanscyoc/Extender: Sondra Come in Treatment: 9 Vital Signs Time Taken: 16:03 Temperature (F): 97.9 Height (in): 72 Pulse (bpm): 81 Weight (lbs): 162 Respiratory Rate (breaths/min): 16 Body Mass Index (BMI): 22 Blood Pressure (mmHg): 133/81 Reference Range: 80 - 120 mg / dl Electronic Signature(s) Signed: 02/20/2021 11:33:27 AM By: Sandre Kitty Entered By: Sandre Kitty on 02/13/2021 16:03:45

## 2021-02-20 NOTE — Progress Notes (Signed)
THAISON, KALUZA ([id number]) Visit Report for 02/19/2021 Chief Complaint Document Details Patient Name: Date of Service: CREEK, SWADER 02/19/2021 3:15 PM Medical Record Number: [id number] Patient Account Number: [id number] Date of Birth/Sex: Treating RN: 03-Oct-1950 (70 y.o. [name] Primary Care Provider: [name] Other Clinician: Referring Provider: Treating Provider/Extender: Bartholome Bill in Treatment: 10 Information Obtained from: Patient Chief Complaint Surgical wound dehiscence of right lower extremity and Great toe wound. Electronic Signature(s) Signed: 02/20/2021 4:37:55 PM By: Kalman Shan DO Entered By: Kalman Shan on 02/20/2021 16:22:24 -------------------------------------------------------------------------------- HPI Details Patient Name: Date of Service: [name] 02/19/2021 3:15 PM Medical Record Number: [id number] Patient Account Number: [id number] Date of Birth/Sex: Treating RN: 15-Mar-1951 (70 y.o. [name] Primary Care Provider: [name] Other Clinician: Referring Provider: Treating Provider/Extender: Bartholome Bill in Treatment: 10 History of Present Illness HPI Description: Admission 6/2 Mr. Iosefa Thibodeau is a 70 year old male with a past medical history of peripheral arterial disease status post right common femoral to peroneal bypass on 10/13/2020 and coil embolization of bypass fistula x2 on 11/01/2020. He presents today because he has wound dehiscence that occurred 3 weeks ago from his recent vascular surgery. He has been using wet-to-dry dressings to this area. He also has necrotic tissue to the right great toe. This was the original symptom that precipitated further vascular evaluation that led to his aortogram and ultimately bypass graft. He is currently keeping this area covered. He currently denies signs of infection. 6/6; patient presents for 1 week follow-up. He  had Kerlix/Coban compression for the past week with Hydrofera Blue underneath. He reports tolerating this well without any issues. He thinks the wound looks smaller today. He has been using Betadine to the toe wound. He has no complaints today. He denies signs of infection. 6/20; patient presents for 2-week follow-up. He had Kerlix/Coban compression with Hydrofera Blue. He has tolerated this well. He reports significant improvement in leg swelling. He also thinks the wound looks smaller. He has no complaints today and denies signs of infection. 6/27; patient presents for 1 week follow-up. He continues to use Hydrofera Blue under Kerlix/Coban compression and has tolerated this well. He continues to report improvement in leg swelling. He is content with the wound care progress at this time. He denies signs of infection. 7/5; patient presents for 1 week follow-up. He has been using Hydrofera Blue and Santyl under Kerlix/Coban. He reports tolerating this well. He has some mild tenderness to the distal portion of the right leg wound. He denies signs of infection. 7/11; patient presents for 1 week follow-up. He has been using Hydrofera Blue and Santyl under Kerlix/Coban. He has no issues or complaints today. He was started on antibiotics at last clinic visit and reports improvement in tenderness and erythema to the wound. 7/21; patient presents for 1 week follow-up. He has been using Hydrofera Blue and Santyl, silver alginate under Kerlix/Coban. He has no issues or complaints today. He reports improvement T his wound healing. Unfortunately he hit his right great toe and split the callus open a little bit. He denies signs of infection. o 7/29; patient presents for 1 week follow-up. He has tolerated the compression wrap well and has no issues or complaints today. He is going to be out of town next week and will not return until next weekend. He denies signs of infection. 02/14/2021 upon evaluation today  patient appears to be doing really about the same in regard to  his toe ulcer. Currently he did have repeat studies and evaluation with the vascular surgeon. The study showed that he has an ABI on the right 1.03 with a TBI of 0.68 and on the left and ABI 0.98 with a TBI of 0.92. Overall this seems to be doing excellent which is great news. With that being said it was noted that the patient does have some need for coil embolization of a persistent fistula in his bypass but that something that is being delayed for now as he is doing quite well. With regard to the dehiscence area there is actually signs of this healing and improving in regard to the leg. In regard to the toe this actually appears to be bone that is actually exposed in the distal portion of the toe. He has not had any x-rays up to this point that I can see. I inquired and he told me he has not gone for x-ray at this point. I think that something more than I need to do although I am going to try to see what I can do about debriding away the necrotic bone that appears to be present in the distal portion of the toe. 8/16; patient presents for 1 week follow-up. Patient states he has been on his feet more in the past week. He had a 7-hour drive yesterday. Overall he is doing well with the compression wrap to the right or left lower extremity. He is keeping the toe covered on the right foot. He has been unable to obtain the x-ray ordered at last clinic visit. He currently denies signs of infection. Electronic Signature(s) Signed: 02/20/2021 4:37:55 PM By: Kalman Shan DO Entered By: Kalman Shan on 02/20/2021 16:23:25 -------------------------------------------------------------------------------- Physical Exam Details Patient Name: Date of Service: DELOSS, RATHEL 02/19/2021 3:15 PM Medical Record Number: [id number] Patient Account Number: [id number] Date of Birth/Sex: Treating RN: Nov 09, 1950 (70 y.o. [name] Primary Care  Provider: [name] Other Clinician: Referring Provider: Treating Provider/Extender: Bartholome Bill in Treatment: 10 Constitutional respirations regular, non-labored and within target range for patient.. Cardiovascular 2+ dorsalis pedis/posterior tibialis pulses. Psychiatric pleasant and cooperative. Notes Left lower extremity: Small open wound with granulation tissue present. No obvious signs of infection. 2+ pitting edema to the knee. Electronic Signature(s) Signed: 02/20/2021 4:37:55 PM By: Kalman Shan DO Entered By: Kalman Shan on 02/20/2021 16:34:19 -------------------------------------------------------------------------------- Physician Orders Details Patient Name: Date of Service: HERBET, WIELENGA 02/19/2021 3:15 PM Medical Record Number: [id number] Patient Account Number: [id number] Date of Birth/Sex: Treating RN: [birth year] (70 y.o. 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 - Dr. Heber [place] Return A Bathing/ Shower/ Hygiene May shower with protection but do not get wound dressing(s) wet. Edema Control - Lymphedema / SCD / Other Elevate legs to the level of the heart or above for 30 minutes daily and/or when sitting, a frequency of: - throughout the day Avoid standing for long periods of time. Exercise regularly Additional Orders / Instructions Follow Nutritious Diet Wound Treatment Wound #2 -  T Great oe Wound Laterality: Right Cleanser: Soap and Water  1 x Per Day/15 Days Discharge Instructions: May shower and wash wound with dial antibacterial soap and water prior to dressing change. Cleanser: Wound Cleanser 1 x Per Day/15 Days Discharge Instructions: Cleanse the wound with wound cleanser prior to applying a clean dressing using gauze sponges, not tissue or cotton balls. Prim Dressing: Betadine 1 x Per Day/15 Days ary Discharge Instructions: apply daily Secondary Dressing: Woven Gauze Sponge, Non-Sterile 4x4 in 1 x Per Day/15 Days Discharge Instructions: Apply over primary dressing as directed. Secured With: 31M Medipore H Soft Cloth Surgical T 4 x 2 (in/yd) 1 x Per Day/15 Days ape Discharge Instructions: Secure dressing with tape as directed. Wound #3 - Lower Leg Wound Laterality: Right, Medial, Distal Cleanser: Soap and Water 1 x Per Week/7 Days Discharge Instructions: May shower and wash wound with dial antibacterial soap and water prior to dressing change. Cleanser: Wound Cleanser (Generic) 1 x Per Week/7 Days Discharge Instructions: Cleanse the wound with wound cleanser prior to applying a clean dressing using gauze sponges, not tissue or cotton balls. Peri-Wound Care: Sween Lotion (Moisturizing lotion) 1 x Per Week/7 Days Discharge Instructions: Apply moisturizing lotion as directed Prim Dressing: KerraCel Ag Gelling Fiber Dressing, 2x2 in (silver alginate) 1 x Per Week/7 Days ary Discharge Instructions: lightly pack into undermining and wound bed , change every other day after wrap removed Secondary Dressing: Woven Gauze Sponge, Non-Sterile 4x4 in (Generic) 1 x Per Week/7 Days Discharge Instructions: Apply over primary dressing as directed. Compression Wrap: Kerlix Roll 4.5x3.1 (in/yd) (Generic) 1 x Per Week/7 Days Discharge Instructions: Apply Kerlix and Coban compression , remove wrap next Friday or Saturday. Compression Wrap: Coban Self-Adherent Wrap 4x5 (in/yd)  (Generic) 1 x Per Week/7 Days Discharge Instructions: Apply over Kerlix as directed. Electronic Signature(s) Signed: 02/20/2021 4:37:55 PM By: Kalman Shan DO Previous Signature: 02/19/2021 5:36:52 PM Version By: [name] Entered By: Kalman Shan on 02/20/2021 16:34:32 -------------------------------------------------------------------------------- Problem List Details Patient Name: Date of Service: NETHANIEL, CIFUENTES 02/19/2021 3:15 PM Medical Record Number: [id number] Patient Account Number: [id number] Date of Birth/Sex: Treating RN: January 12, 1951 (70 y.o. 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 12/07/2020 No Yes I73.9 Peripheral vascular disease, unspecified 12/07/2020 No Yes Inactive Problems ICD-10 Code Description Active Date Inactive Date T81.30XA Disruption of wound, unspecified, initial encounter 12/07/2020 12/07/2020 Resolved Problems Electronic Signature(s) Signed: 02/20/2021 4:37:55 PM By: Kalman Shan DO Previous Signature: 02/19/2021 3:40:28 PM Version By: [name] Entered By: Kalman Shan on 02/20/2021 16:21:38 -------------------------------------------------------------------------------- Progress Note Details Patient Name: Date of Service: [name] 02/19/2021 3:15 PM Medical Record Number: [id number] Patient Account Number: [id number] Date of Birth/Sex: Treating RN: 1951-03-06 (70 y.o. [name] Primary Care Provider: [name] Other  Clinician: Referring Provider: Treating Provider/Extender: Bartholome Bill in Treatment: 10 Subjective Chief Complaint Information obtained from Patient Surgical wound dehiscence of right lower extremity and Great toe wound. History of Present Illness (HPI) Admission 6/2 Mr. Raghav Cockerill is a 70 year old male with a past medical history of peripheral arterial disease status post right common femoral to peroneal bypass on 10/13/2020 and coil embolization of bypass fistula  x2 on 11/01/2020. He presents today because he has wound dehiscence that occurred 3 weeks ago from his recent vascular surgery. He has been using wet-to-dry dressings to this area. He also has necrotic tissue to the right great toe. This was the original symptom that precipitated further vascular evaluation that led to his aortogram and ultimately bypass graft. He is currently keeping this area covered. He currently denies signs of infection. 6/6; patient presents for 1 week follow-up. He had Kerlix/Coban compression for the past week with Hydrofera Blue underneath. He reports tolerating this well without any issues. He thinks the wound looks smaller today. He has been using Betadine to the toe wound. He has no complaints today. He denies signs of infection. 6/20; patient presents for 2-week follow-up. He had Kerlix/Coban compression with Hydrofera Blue. He has tolerated this well. He reports significant improvement in leg swelling. He also thinks the wound looks smaller. He has no complaints today and denies signs of infection. 6/27; patient presents for 1 week follow-up. He continues to use Hydrofera Blue under Kerlix/Coban compression and has tolerated this well. He continues to report improvement in leg swelling. He is content with the wound care progress at this time. He denies signs of infection. 7/5; patient presents for 1 week follow-up. He has been using Hydrofera Blue and Santyl under Kerlix/Coban.  He reports tolerating this well. He has some mild tenderness to the distal portion of the right leg wound. He denies signs of infection. 7/11; patient presents for 1 week follow-up. He has been using Hydrofera Blue and Santyl under Kerlix/Coban. He has no issues or complaints today. He was started on antibiotics at last clinic visit and reports improvement in tenderness and erythema to the wound. 7/21; patient presents for 1 week follow-up. He has been using Hydrofera Blue and Santyl, silver alginate under Kerlix/Coban. He has no issues or complaints today. He reports improvement T his wound healing. Unfortunately he hit his right great toe and split the callus open a little bit. He denies signs of infection. o 7/29; patient presents for 1 week follow-up. He has tolerated the compression wrap well and has no issues or complaints today. He is going to be out of town next week and will not return until next weekend. He denies signs of infection. 02/14/2021 upon evaluation today patient appears to be doing really about the same in regard to his toe ulcer. Currently he did have repeat studies and evaluation with the vascular surgeon. The study showed that he has an ABI on the right 1.03 with a TBI of 0.68 and on the left and ABI 0.98 with a TBI of 0.92. Overall this seems to be doing excellent which is great news. With that being said it was noted that the patient does have some need for coil embolization of a persistent fistula in his bypass but that something that is being delayed for now as he is doing quite well. With regard to the dehiscence area there is actually signs of this healing and improving in regard to the leg. In regard to the toe this actually appears to be bone that is actually exposed in the distal portion of the toe. He has not had any x-rays up to this point that I can see. I inquired and he told me he has not gone for x-ray at this point. I think that something more than I need to do  although I am going to try to see what I can do about debriding  away the necrotic bone that appears to be present in the distal portion of the toe. 8/16; patient presents for 1 week follow-up. Patient states he has been on his feet more in the past week. He had a 7-hour drive yesterday. Overall he is doing well with the compression wrap to the right or left lower extremity. He is keeping the toe covered on the right foot. He has been unable to obtain the x-ray ordered at last clinic visit. He currently denies signs of infection. Patient History Information obtained from Patient. Family History Unknown History. Social History Never smoker, Marital Status - Married, Alcohol Use - Moderate, Drug Use - No History, Caffeine Use - Daily. Medical History Cardiovascular Patient has history of Coronary Artery Disease, Peripheral Arterial Disease, Peripheral Venous Disease Objective Constitutional respirations regular, non-labored and within target range for patient.. Vitals Time Taken: 3:26 PM, Height: 72 in, Weight: 162 lbs, BMI: 22, Temperature: 97.7 F, Pulse: 69 bpm, Respiratory Rate: 17 breaths/min, Blood Pressure: 144/87 mmHg. Cardiovascular 2+ dorsalis pedis/posterior tibialis pulses. Psychiatric pleasant and cooperative. General Notes: Left lower extremity: Small open wound with granulation tissue present. No obvious signs of infection. 2+ pitting edema to the knee. Integumentary (Hair, Skin) Wound #2 status is Open. Original cause of wound was Gradually Appeared. The date acquired was: 09/18/2020. The wound has been in treatment 10 weeks. The wound is located on the Right T Great. The wound measures 0.5cm length x 0.5cm width x 0.1cm depth; 0.196cm^2 area and 0.02cm^3 volume. There is oe bone and Fat Layer (Subcutaneous Tissue) exposed. There is no tunneling or undermining noted. There is a small amount of serous drainage noted. The wound margin is distinct with the outline attached to  the wound base. There is large (67-100%) red, pink granulation within the wound bed. There is a small (1-33%) amount of necrotic tissue within the wound bed including Eschar. Wound #3 status is Open. Original cause of wound was Gradually Appeared. The date acquired was: 12/11/2020. The wound has been in treatment 10 weeks. The wound is located on the Right,Distal,Medial Lower Leg. The wound measures 0.6cm length x 0.3cm width x 0.6cm depth; 0.141cm^2 area and 0.085cm^3 volume. There is Fat Layer (Subcutaneous Tissue) exposed. There is no tunneling noted, however, there is undermining starting at 10:00 and ending at 2:00 with a maximum distance of 0.6cm. There is a medium amount of serosanguineous drainage noted. The wound margin is distinct with the outline attached to the wound base. There is large (67-100%) red, pink granulation within the wound bed. There is no necrotic tissue within the wound bed. 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 This is likely from standing for long periods of time and also driving 7 hours yesterday. He has no increased pain. Wound appears healing and I recommended continuing with silver alginate under compression. Also recommended elevating his leg and not being on his feet for long periods of time. His right great toe wound is scabbed over. He had a bone biopsy done by Margarita Grizzle at last clinic visit that showed devitalized bone fragments with hyperkeratotic skin and superficial dermis with chronic active inflammation. I recommended he follow-up and obtain his x-ray. According to the clinic note it looks like all devitalized bone was removed. T  oday it  appears like it is healing. Plan Follow-up Appointments: Return Appointment in 1 week. - Dr. Heber  Bathing/ Shower/ Hygiene: May shower with protection but do not get wound dressing(s) wet. Edema Control - Lymphedema / SCD / Other: Elevate legs to the level of the heart or above for 30 minutes daily and/or when sitting, a frequency of: - throughout the day Avoid standing for long periods of time. Exercise regularly Additional Orders / Instructions: Follow Nutritious Diet WOUND #2: - T Great Wound Laterality: Right oe Cleanser: Soap and Water 1 x Per [id number] Days Discharge Instructions: May shower and wash wound with dial antibacterial soap and water prior to dressing change. Cleanser: Wound Cleanser 1 x Per Day/15 Days Discharge Instructions: Cleanse the wound with wound cleanser prior to applying a clean dressing using gauze sponges, not tissue or cotton balls. Prim Dressing: Betadine 1 x Per Day/15 Days ary Discharge Instructions: apply daily Secondary Dressing: Woven Gauze Sponge, Non-Sterile 4x4 in 1 x Per Day/15 Days Discharge Instructions: Apply over primary dressing as directed. Secured With: 82M Medipore H Soft Cloth Surgical T 4 x 2 (in/yd) 1 x Per Day/15 Days ape Discharge Instructions: Secure dressing with tape as directed. WOUND #3: - Lower Leg Wound Laterality: Right, Medial, Distal Cleanser: Soap and Water 1 x Per Week/7 Days Discharge Instructions: May shower and wash wound with dial antibacterial soap and water prior to dressing change. Cleanser: Wound Cleanser (Generic) 1 x Per Week/7 Days Discharge Instructions: Cleanse the wound with wound cleanser prior to applying a clean dressing using gauze sponges, not tissue or cotton balls. Peri-Wound Care: Sween Lotion (Moisturizing lotion) 1 x Per Week/7 Days Discharge Instructions: Apply moisturizing lotion as directed Prim Dressing: KerraCel Ag Gelling Fiber Dressing, 2x2 in (silver alginate) 1 x Per Week/7  Days ary Discharge Instructions: lightly pack into undermining and wound bed , change every other day after wrap removed Secondary Dressing: Woven Gauze Sponge, Non-Sterile 4x4 in (Generic) 1 x Per Week/7 Days Discharge Instructions: Apply over primary dressing as directed. Com pression Wrap: Kerlix Roll 4.5x3.1 (in/yd) (Generic) 1 x Per Week/7 Days Discharge Instructions: Apply Kerlix and Coban compression , remove wrap next Friday or Saturday. Com pression Wrap: Coban Self-Adherent Wrap 4x5 (in/yd) (Generic) 1 x Per Week/7 Days Discharge Instructions: Apply over Kerlix as directed. 1. Continue silver alginate under Kerlix/Coban to the left lower extremity 2. Betadine to the right toe 3. Obtain right foot x-ray 4. Follow-up in 1 week Electronic Signature(s) Signed: 02/20/2021 4:37:55 PM By: Kalman Shan DO Entered By: Kalman Shan on 02/20/2021 16:34:44 -------------------------------------------------------------------------------- HxROS Details Patient Name: Date of Service: [name] 02/19/2021 3:15 PM Medical Record Number: [id number] Patient Account Number: [id number] Date of Birth/Sex: Treating RN: 02-07-1951 (70 y.o. [name] Primary Care Provider: [name] Other Clinician: Referring Provider: Treating Provider/Extender: Bartholome Bill in Treatment: 10 Information Obtained From Patient Cardiovascular Medical History: Positive for: Coronary Artery Disease; Peripheral Arterial Disease; Peripheral Venous Disease Immunizations Pneumococcal Vaccine: Received Pneumococcal Vaccination: Yes Received Pneumococcal Vaccination On or After 60th Birthday: No Implantable Devices None Family and Social History Unknown History: Yes; Never smoker; Marital Status - Married; Alcohol Use: Moderate; Drug Use: No History; Caffeine Use: Daily; Financial Concerns: No; Food, Clothing or Shelter Needs: No; Support System Lacking: No;  Transportation Concerns: No Electronic Signature(s) Signed: 02/20/2021 4:37:55 PM By: Kalman Shan DO Signed: 02/20/2021 5:08:22 PM By: [name] Entered By: Kalman Shan on 02/20/2021 16:23:30 -------------------------------------------------------------------------------- SuperBill Details Patient Name:  Date of Service: EMIRHAN, SAYSON 02/19/2021 Medical Record Number: [id number] Patient Account Number: [id number] Date of Birth/Sex: Treating RN: 03-11-1951 (70 y.o. 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

## 2021-02-26 ENCOUNTER — Encounter (HOSPITAL_BASED_OUTPATIENT_CLINIC_OR_DEPARTMENT_OTHER): Payer: PPO | Admitting: Internal Medicine

## 2021-02-26 ENCOUNTER — Other Ambulatory Visit: Payer: Self-pay | Admitting: Pharmacist

## 2021-02-26 ENCOUNTER — Other Ambulatory Visit: Payer: Self-pay

## 2021-02-26 DIAGNOSIS — T8130XD Disruption of wound, unspecified, subsequent encounter: Secondary | ICD-10-CM

## 2021-02-26 DIAGNOSIS — I739 Peripheral vascular disease, unspecified: Secondary | ICD-10-CM | POA: Diagnosis not present

## 2021-02-26 DIAGNOSIS — L97519 Non-pressure chronic ulcer of other part of right foot with unspecified severity: Secondary | ICD-10-CM

## 2021-02-26 DIAGNOSIS — L97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 with unspecified severity: Secondary | ICD-10-CM

## 2021-02-26 NOTE — Progress Notes (Signed)
[name], [name] ([id number]) Visit Report for 02/26/2021 Chief Complaint Document Details Patient Name: Date of Service: [name], [name] 02/26/2021 8:00 A M Medical Record Number: [id number] Patient Account Number: [id number] Date of Birth/Sex: Treating RN: [birth year] (70 y.o. [name] Primary Care Provider: [name] Other Clinician: Referring Provider: Treating Provider/Extender: Bartholome Bill in Treatment: 11 Information Obtained from: Patient Chief Complaint Surgical wound dehiscence of right lower extremity and Great toe wound. Electronic Signature(s) Signed: 02/26/2021 9:01:09 AM By: Kalman Shan DO Entered By: Kalman Shan on 02/26/2021 08:53:16 -------------------------------------------------------------------------------- HPI Details Patient Name: Date of Service: Wyatt Loth. 02/26/2021 8:00 A M Medical Record Number: [id number] Patient Account Number: [id number] Date of Birth/Sex: Treating RN: May 05, 1951 (70 y.o. [name] Primary Care Provider: [name] Other Clinician: Referring Provider: Treating Provider/Extender: Bartholome Bill in Treatment: 11 History of Present Illness HPI Description: Admission 6/2 [name] is a 70 year old male with a past medical history of peripheral arterial disease status post right common femoral to peroneal bypass on 10/13/2020 and coil embolization of bypass fistula x2 on 11/01/2020. He presents today because he has wound dehiscence that occurred 3 weeks ago from his recent vascular surgery. He has been using wet-to-dry dressings to this area. He also has necrotic tissue to the right great toe. This was the original symptom that precipitated further vascular evaluation that led to his aortogram and ultimately bypass graft. He is currently keeping this area covered. He currently denies signs of infection. 6/6; patient presents for 1 week follow-up. He  had Kerlix/Coban compression for the past week with Hydrofera Blue underneath. He reports tolerating this well without any issues. He thinks the wound looks smaller today. He has been using Betadine to the toe wound. He has no complaints today. He denies signs of infection. 6/20; patient presents for 2-week follow-up. He had Kerlix/Coban compression with Hydrofera Blue. He has tolerated this well. He reports significant improvement in leg swelling. He also thinks the wound looks smaller. He has no complaints today and denies signs of infection. 6/27; patient presents for 1 week follow-up. He continues to use Hydrofera Blue under Kerlix/Coban compression and has tolerated this well. He continues to report improvement in leg swelling. He is content with the wound care progress at this time. He denies signs of infection. 7/5; patient presents for 1 week follow-up. He has been using Hydrofera Blue and Santyl under Kerlix/Coban. He reports tolerating this well. He has some mild tenderness to the distal portion of the right leg wound. He denies signs of infection. 7/11; patient presents for 1 week follow-up. He has been using Hydrofera Blue and Santyl under Kerlix/Coban. He has no issues or complaints today. He was started on antibiotics at last clinic visit and reports improvement in tenderness and erythema to the wound. 7/21; patient presents for 1 week follow-up. He has been using Hydrofera Blue and Santyl, silver alginate under Kerlix/Coban. He has no issues or complaints today. He reports improvement T his wound healing. Unfortunately he hit his right great toe and split the callus open a little bit. He denies signs of infection. o 7/29; patient presents for 1 week follow-up. He has tolerated the compression wrap well and has no issues or complaints today. He is going to be out of town next week and will not return until next weekend. He denies signs of infection. 02/14/2021 upon evaluation today  patient appears to be doing really about the same in  regard to his toe ulcer. Currently he did have repeat studies and evaluation with the vascular surgeon. The study showed that he has an ABI on the right 1.03 with a TBI of 0.68 and on the left and ABI 0.98 with a TBI of 0.92. Overall this seems to be doing excellent which is great news. With that being said it was noted that the patient does have some need for coil embolization of a persistent fistula in his bypass but that something that is being delayed for now as he is doing quite well. With regard to the dehiscence area there is actually signs of this healing and improving in regard to the leg. In regard to the toe this actually appears to be bone that is actually exposed in the distal portion of the toe. He has not had any x-rays up to this point that I can see. I inquired and he told me he has not gone for x-ray at this point. I think that something more than I need to do although I am going to try to see what I can do about debriding away the necrotic bone that appears to be present in the distal portion of the toe. 8/16; patient presents for 1 week follow-up. Patient states he has been on his feet more in the past week. He had a 7-hour drive yesterday. Overall he is doing well with the compression wrap to the right or left lower extremity. He is keeping the toe covered on the right foot. He has been unable to obtain the x-ray ordered at last clinic visit. He currently denies signs of infection. 8/22; patient presents for 1 week follow-up. He has no issues or concerns today. He states he is going to obtain his right foot x-ray this week. He currently denies signs of infection. Electronic Signature(s) Signed: 02/26/2021 9:01:09 AM By: Kalman Shan DO Entered By: Kalman Shan on 02/26/2021 08:53:51 -------------------------------------------------------------------------------- Physical Exam Details Patient Name: Date of  Service: [name], [name] 02/26/2021 8:00 A M Medical Record Number: [id number] Patient Account Number: [id number] Date of Birth/Sex: Treating RN: 07-Nov-1950 (70 y.o. [name] Primary Care Provider: [name] Other Clinician: Referring Provider: Treating Provider/Extender: Bartholome Bill in Treatment: 11 Constitutional respirations regular, non-labored and within target range for patient.Marland Kitchen Psychiatric pleasant and cooperative. Notes Right lower extremity: Small open wound with granulation tissue to the lower aspect of the leg. Good edema control. No signs of infection. T the right great toe o there is a scab to the distal aspect with no drainage noted. Electronic Signature(s) Signed: 02/26/2021 9:01:09 AM By: Kalman Shan DO Entered By: Kalman Shan on 02/26/2021 08:54:58 -------------------------------------------------------------------------------- Physician Orders Details Patient Name: Date of Service: Wyatt Loth. 02/26/2021 8:00 A M Medical Record Number: [id number] Patient Account Number: [id number] Date of Birth/Sex: Treating RN: 1951-06-26 (70 y.o. 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 - Dr. Heber [place] Return A Nurse Visit: - Next week for dressing change Bathing/ Shower/ Hygiene May shower with protection but do not get wound dressing(s) wet. Edema Control - Lymphedema /  SCD /  Other Elevate legs to the level of the heart or above for 30 minutes daily and/or when sitting, a frequency of: - throughout the day Avoid standing for long periods of time. Exercise regularly Additional Orders / Instructions Follow Nutritious Diet Wound Treatment Wound #2 - T Great oe Wound Laterality: Right Cleanser: Soap and Water 1 x Per [id number] Days Discharge Instructions: May shower and wash wound with dial antibacterial soap and water prior to dressing change. Cleanser: Wound Cleanser 1 x Per Day/15 Days Discharge Instructions: Cleanse the wound with wound cleanser prior to applying a clean dressing using gauze sponges, not tissue or cotton balls. Prim Dressing: Betadine 1 x Per Day/15 Days ary Discharge Instructions: apply daily Secondary Dressing: Woven Gauze Sponge, Non-Sterile 4x4 in 1 x Per Day/15 Days Discharge Instructions: Apply over primary dressing as directed. Secured With: 40M Medipore H Soft Cloth Surgical T 4 x 2 (in/yd) 1 x Per Day/15 Days ape Discharge Instructions: Secure dressing with tape as directed. Wound #3 - Lower Leg Wound Laterality: Right, Medial, Distal Cleanser: Soap and Water 1 x Per Week/7 Days Discharge Instructions: May shower and wash wound with dial antibacterial soap and water prior to dressing change. Cleanser: Wound Cleanser (Generic) 1 x Per Week/7 Days Discharge Instructions: Cleanse the wound with wound cleanser prior to applying a clean dressing using gauze sponges, not tissue or cotton balls. Peri-Wound Care: Sween Lotion (Moisturizing lotion) 1 x Per Week/7 Days Discharge Instructions: Apply moisturizing lotion as directed Prim Dressing: KerraCel Ag Gelling Fiber Dressing, 2x2 in (silver alginate) 1 x Per Week/7 Days ary Discharge Instructions: lightly pack into undermining and wound bed , change every other day after wrap removed Secondary Dressing: Woven Gauze Sponge, Non-Sterile 4x4 in (Generic) 1 x Per Week/7 Days Discharge  Instructions: Apply over primary dressing as directed. Compression Wrap: Kerlix Roll 4.5x3.1 (in/yd) (Generic) 1 x Per Week/7 Days Discharge Instructions: Apply Kerlix and Coban compression , remove wrap next Friday or Saturday. Compression Wrap: Coban Self-Adherent Wrap 4x5 (in/yd) (Generic) 1 x Per Week/7 Days Discharge Instructions: Apply over Kerlix as directed. Electronic Signature(s) Signed: 02/26/2021 9:01:09 AM By: Kalman Shan DO Entered By: Kalman Shan on 02/26/2021 08:55:10 -------------------------------------------------------------------------------- Problem List Details Patient Name: Date of Service: Wyatt Loth. 02/26/2021 8:00 A M Medical Record Number: [id number] Patient Account Number: [id number] Date of Birth/Sex: Treating RN: [birth year] (70 y.o. 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 02/26/2021 8:00 A M Medical Record Number: [id number] Patient Account Number: [id number] Date of Birth/Sex: Treating RN: 06-09-1951 (70 y.o. [name] Primary Care Provider: [name] Other Clinician: Referring Provider: Treating Provider/Extender: Bartholome Bill in Treatment: 11 Subjective Chief Complaint Information obtained  from Patient Surgical wound dehiscence of right lower extremity and Great toe wound. History of Present Illness (HPI) Admission 6/2 Mr. [name] is a 70 year old male with a past medical history of peripheral arterial disease status post right common femoral to peroneal bypass on 10/13/2020 and coil embolization of bypass fistula x2 on 11/01/2020. He presents today because he has wound dehiscence that occurred 3 weeks ago from his recent vascular surgery. He has been using wet-to-dry dressings to this area. He also has necrotic tissue to the right great toe. This was the original symptom that precipitated further vascular evaluation that led to his aortogram and ultimately bypass graft. He is currently keeping this area covered. He currently denies signs of infection. 6/6; patient presents for 1 week follow-up. He had Kerlix/Coban compression for the past week with Hydrofera Blue underneath. He reports tolerating this well without any issues. He thinks the wound looks smaller today. He has been using Betadine to the toe wound. He has no complaints today. He denies signs of infection. 6/20; patient presents for 2-week follow-up. He had Kerlix/Coban compression with Hydrofera Blue. He has tolerated this well. He reports significant improvement in leg swelling. He also thinks the wound looks smaller. He has no complaints today and denies signs of infection. 6/27; patient presents for 1 week follow-up. He continues to use Hydrofera Blue under Kerlix/Coban compression and has tolerated this well. He continues to report improvement  in leg swelling. He is content with the wound care progress at this time. He denies signs of infection. 7/5; patient presents for 1 week follow-up. He has been using Hydrofera Blue and Santyl under Kerlix/Coban. He reports tolerating this well. He has some mild tenderness to the distal portion of the right leg wound. He denies signs of infection. 7/11; patient presents for 1 week follow-up. He has been using Hydrofera Blue and Santyl under Kerlix/Coban. He has no issues or complaints today. He was started on antibiotics at last clinic visit and reports improvement in tenderness and erythema to the wound. 7/21; patient presents for 1 week follow-up. He has been using Hydrofera Blue and Santyl, silver alginate under Kerlix/Coban. He has no issues or complaints today. He reports improvement T his wound healing. Unfortunately he hit his right great toe and split the callus open a little bit. He denies signs of infection. o 7/29; patient presents for 1 week follow-up. He has tolerated the compression wrap well and has no issues or complaints today. He is going to be out of town next week and will not return until next weekend. He denies signs of infection. 02/14/2021 upon evaluation today patient appears to be doing really about the same in regard to his toe ulcer. Currently he did have repeat studies and evaluation with the vascular surgeon. The study showed that he has an ABI on the right 1.03 with a TBI of 0.68 and on the left and ABI 0.98 with a TBI of 0.92. Overall this seems to be doing excellent which is great news. With that being said it was noted that the patient does have some need for coil embolization of a persistent fistula in his bypass but that something that is being delayed for now as he is doing quite well. With regard to the dehiscence area there is actually signs of this healing and improving in regard to the leg. In regard to the toe this actually appears to be bone that is actually  exposed in the distal portion of the toe. He  has not had any x-rays up to this point that I can see. I inquired and he told me he has not gone for x-ray at this point. I think that something more than I need to do although I am going to try to see what I can do about debriding away the necrotic bone that appears to be present in the distal portion of the toe. 8/16; patient presents for 1 week follow-up. Patient states he has been on his feet more in the past week. He had a 7-hour drive yesterday. Overall he is doing well with the compression wrap to the right or left lower extremity. He is keeping the toe covered on the right foot. He has been unable to obtain the x-ray ordered at last clinic visit. He currently denies signs of infection. 8/22; patient presents for 1 week follow-up. He has no issues or concerns today. He states he is going to obtain his right foot x-ray this week. He currently denies signs of infection. Patient History Information obtained from Patient. Family History Unknown History. Social History Never smoker, Marital Status - Married, Alcohol Use - Moderate, Drug Use - No History, Caffeine Use - Daily. Medical History Cardiovascular Patient has history of Coronary Artery Disease, Peripheral Arterial Disease, Peripheral Venous Disease Objective Constitutional respirations regular, non-labored and within target range for patient.. Vitals Time Taken: 8:06 AM, Height: 72 in, Weight: 162 lbs, BMI: 22, Temperature: 97.8 F, Pulse: 92 bpm, Respiratory Rate: 17 breaths/min, Blood Pressure: 121/76 mmHg. Psychiatric pleasant and cooperative. General Notes: Right lower extremity: Small open wound with granulation tissue to the lower aspect of the leg. Good edema control. No signs of infection. To the right great toe there is a scab to the distal aspect with no drainage noted. Integumentary (Hair, Skin) Wound #2 status is Open. Original cause of wound was Gradually Appeared. The  date acquired was: 09/18/2020. The wound has been in treatment 11 weeks. The wound is located on the Right T Great. The wound measures 0.1cm length x 0.1cm width x 0.1cm depth; 0.008cm^2 area and 0.001cm^3 volume. There is oe bone and Fat Layer (Subcutaneous Tissue) exposed. There is no tunneling or undermining noted. There is a small amount of serous drainage noted. The wound margin is distinct with the outline attached to the wound base. There is no granulation within the wound bed. There is a large (67-100%) amount of necrotic tissue within the wound bed including Eschar. Wound #3 status is Open. Original cause of wound was Gradually Appeared. The date acquired was: 12/11/2020. The wound has been in treatment 11 weeks. The wound is located on the Right,Distal,Medial Lower Leg. The wound measures 0.5cm length x 0.3cm width x 0.4cm depth; 0.118cm^2 area and 0.047cm^3 volume. There is Fat Layer (Subcutaneous Tissue) exposed. There is no undermining noted, however, there is tunneling at 12:00 with a maximum distance of 0.6cm. There is a medium amount of serosanguineous drainage noted. The wound margin is distinct with the outline attached to the wound base. There is large (67-100%) red granulation within the wound bed. There is no necrotic tissue within the wound bed. 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 I would like to give silver alginate a  couple more weeks before I switch to something like endoform or collagen. Patient had debridement of the right great toe bone 2 weeks ago. Biopsy results show devitalized bone  fragments with chronic active inflammation. The area appears healing however I would like for him to obtain his x-ray since I do not know the extent of this. I recommended he do this. No obvious signs of infection on exam. Plan Follow-up Appointments: Return Appointment in 2 weeks. - Dr. Heber [place] Nurse Visit: - Next week for dressing change Bathing/ Shower/ Hygiene: May shower with protection but do not get wound dressing(s) wet. Edema Control - Lymphedema / SCD / Other: Elevate legs to the level of the heart or above for 30 minutes daily and/or when sitting, a frequency of: - throughout the day Avoid standing for long periods of time. Exercise regularly Additional Orders / Instructions: Follow Nutritious Diet WOUND #2: - T Great Wound Laterality: Right oe Cleanser: Soap and Water 1 x Per [id number] Days Discharge Instructions: May shower and wash wound with dial antibacterial soap and water prior to dressing change. Cleanser: Wound Cleanser 1 x Per Day/15 Days Discharge Instructions: Cleanse the wound with wound cleanser prior to applying a clean dressing using gauze sponges, not tissue or cotton balls. Prim Dressing: Betadine 1 x Per Day/15 Days ary Discharge Instructions: apply daily Secondary Dressing: Woven Gauze Sponge, Non-Sterile 4x4 in 1 x Per Day/15 Days Discharge Instructions: Apply over primary dressing as directed. Secured With: 7M Medipore H Soft Cloth Surgical T 4 x 2 (in/yd) 1 x Per Day/15 Days ape Discharge Instructions: Secure dressing with tape as directed. WOUND #3: - Lower Leg Wound Laterality: Right, Medial, Distal Cleanser: Soap and Water 1 x Per Week/7 Days Discharge Instructions: May shower and wash wound with dial antibacterial soap and water prior to dressing change. Cleanser: Wound Cleanser (Generic) 1 x Per Week/7 Days Discharge Instructions: Cleanse the wound with wound cleanser prior to applying a clean dressing using gauze sponges, not tissue or cotton  balls. Peri-Wound Care: Sween Lotion (Moisturizing lotion) 1 x Per Week/7 Days Discharge Instructions: Apply moisturizing lotion as directed Prim Dressing: KerraCel Ag Gelling Fiber Dressing, 2x2 in (silver alginate) 1 x Per Week/7 Days ary Discharge Instructions: lightly pack into undermining and wound bed , change every other day after wrap removed Secondary Dressing: Woven Gauze Sponge, Non-Sterile 4x4 in (Generic) 1 x Per Week/7 Days Discharge Instructions: Apply over primary dressing as directed. Com pression Wrap: Kerlix Roll 4.5x3.1 (in/yd) (Generic) 1 x Per Week/7 Days Discharge Instructions: Apply Kerlix and Coban compression , remove wrap next Friday or Saturday. Com pression Wrap: Coban Self-Adherent Wrap 4x5 (in/yd) (Generic) 1 x Per Week/7 Days Discharge Instructions: Apply over Kerlix as directed. 1. Continue silver alginate under Kerlix/Coban 2. Obtain right foot x-ray 3. Follow-up next week for nurse visit and 2 weeks with me Electronic Signature(s) Signed: 02/26/2021 9:01:09 AM By: Kalman Shan DO Entered By: Kalman Shan on 02/26/2021 09:00:38 -------------------------------------------------------------------------------- HxROS Details Patient Name: Date of Service: Wyatt Loth. 02/26/2021 8:00 A M Medical Record Number: [id number] Patient Account Number: [id number] Date of Birth/Sex: Treating RN: Aug 26, 1950 (70 y.o. [name] Primary Care Provider: [name] Other Clinician: Referring Provider: Treating Provider/Extender: Bartholome Bill in Treatment: 11 Information Obtained From Patient Cardiovascular Medical History: Positive for: Coronary Artery Disease; Peripheral Arterial Disease; Peripheral Venous Disease Immunizations Pneumococcal Vaccine: Received Pneumococcal Vaccination: Yes Received Pneumococcal Vaccination On or After 60th Birthday: No Implantable Devices None Family and  Social History Unknown  History: Yes; Never smoker; Marital Status - Married; Alcohol Use: Moderate; Drug Use: No History; Caffeine Use: Daily; Financial Concerns: No; Food, Clothing or Shelter Needs: No; Support System Lacking: No; Transportation Concerns: No Electronic Signature(s) Signed: 02/26/2021 9:01:09 AM By: Kalman Shan DO Signed: 02/26/2021 5:26:48 PM By: [name] Entered By: Kalman Shan on 02/26/2021 08:53:56 -------------------------------------------------------------------------------- [place] Details Patient Name: Date of Service: [name], [name] 02/26/2021 Medical Record Number: [id number] Patient Account Number: [id number] Date of Birth/Sex: Treating RN: 09-10-1950 (70 y.o. 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

## 2021-02-28 DIAGNOSIS — H2513 Age-related nuclear cataract, bilateral: Secondary | ICD-10-CM | POA: Diagnosis not present

## 2021-02-28 DIAGNOSIS — H5203 Hypermetropia, bilateral: Secondary | ICD-10-CM | POA: Diagnosis not present

## 2021-02-28 NOTE — Progress Notes (Signed)
[name], [name] ([id number]) Visit Report for 02/26/2021 Arrival Information Details Patient Name: Date of Service: [name], [name] 02/26/2021 8:00 A M Medical Record Number: [id number] Patient Account Number: [id number] Date of Birth/Sex: Treating RN: Nov 24, 1950 (70 y.o. [name] Primary Care Hollyn Stucky: [name] Other Clinician: Referring Teddy Rebstock: Treating Davey Bergsma/Extender: Bartholome Bill in Treatment: 11 Visit Information History Since Last Visit Added or deleted any medications: No Patient Arrived: Ambulatory Any new allergies or adverse reactions: No Arrival Time: 08:05 Had a fall or experienced change in No Accompanied By: self activities of daily living that may affect Transfer Assistance: None risk of falls: Patient Identification Verified: Yes Signs or symptoms of abuse/neglect since last visito No Patient Requires Transmission-Based Precautions: No Hospitalized since last visit: No Patient Has Alerts: Yes Implantable device outside of the clinic excluding No Patient Alerts: Patient on Blood Thinner cellular tissue based products placed in the center ABI R=0.95 since last visit: ABI L=1.21 Has Dressing in Place as Prescribed: Yes Pain Present Now: No Electronic Signature(s) Signed: 02/28/2021 9:12:52 AM By: Sandre Kitty Entered By: Sandre Kitty on 02/26/2021 08:06:45 -------------------------------------------------------------------------------- Clinic Level of Care Assessment Details Patient Name: Date of Service: [name] 02/26/2021 8:00 A M Medical Record Number: [id number] Patient Account Number: [id number] Date of Birth/Sex: Treating RN: [birth year] (70 y.o. [name] Primary Care Missie Gehrig: [name] Other Clinician: Referring Olaf Mesa: Treating Law Corsino/Extender: Bartholome Bill in Treatment: 11 Clinic Level of Care Assessment Items TOOL 4 Quantity Score X- 1 0 Use  when only an EandM is performed on FOLLOW-UP visit ASSESSMENTS - Nursing Assessment / Reassessment X- 1 10 Reassessment of Co-morbidities (includes updates in patient status) X- 1 5 Reassessment of Adherence to Treatment Plan ASSESSMENTS - Wound and Skin A ssessment / Reassessment _0  - 0 Simple Wound Assessment / Reassessment - one wound X- 2 5 Complex Wound Assessment / Reassessment - multiple wounds _1  - 0 Dermatologic / Skin Assessment (not related to wound area) ASSESSMENTS - Focused Assessment _2  - 0 Circumferential Edema Measurements - multi extremities _3  - 0 Nutritional Assessment / Counseling / Intervention _4  - 0 Lower Extremity Assessment (monofilament, tuning fork, pulses) _5  - 0 Peripheral Arterial Disease Assessment (using hand held doppler) ASSESSMENTS - Ostomy and/or Continence Assessment and Care _6  - 0 Incontinence Assessment and Management _7  - 0 Ostomy Care Assessment and Management (repouching, etc.) PROCESS - Coordination of Care _8  - 0 Simple Patient / Family Education for ongoing care X- 1 20 Complex (extensive) Patient / Family Education for ongoing care _9  - 0 Staff obtains Programmer, systems, Records, T Results / Process Orders est _10  - 0 Staff telephones HHA, Nursing Homes / Clarify orders / etc _11  - 0 Routine Transfer to another Facility (non-emergent condition) _12  - 0 Routine Hospital Admission (non-emergent condition) _13  - 0 New Admissions / Biomedical engineer / Ordering NPWT Apligraf, etc. , _14  - 0 Emergency Hospital Admission (emergent condition) _15  - 0 Simple Discharge Coordination _16  - 0 Complex (extensive) Discharge Coordination PROCESS - Special Needs _17  - 0 Pediatric / Minor Patient Management _18  - 0 Isolation Patient Management _19  - 0 Hearing / Language / Visual special needs _20  - 0 Assessment of Community assistance (transportation, D/C planning, etc.) _21  - 0 Additional assistance / Altered mentation _22  - 0 Support  Surface(s) Assessment (bed, cushion, seat, etc.) INTERVENTIONS - Wound Cleansing / Measurement _23  - 0 Simple Wound Cleansing - one wound X- 2 5 Complex Wound Cleansing -  multiple wounds _0  - 0 Wound Imaging (photographs - any number of wounds) X- 1 5 Wound Tracing (instead of photographs) _1  - 0 Simple Wound Measurement - one wound X- 2 5 Complex Wound Measurement - multiple wounds INTERVENTIONS - Wound Dressings X - Small Wound Dressing one or multiple wounds 1 10 _2  - 0 Medium Wound Dressing one or multiple wounds X- 1 20 Large Wound Dressing one or multiple wounds <FUXNATFTDDUKGURK>_2<\/HCWCBJSEGBTDVVOH>_6  - 0 Application of Medications - topical <WVPXTGGYIRSWNIOE>_7<\/OJJKKXFGHWEXHBZJ>_6  - 0 Application of Medications - injection INTERVENTIONS - Miscellaneous _5  - 0 External ear exam _6  - 0 Specimen Collection (cultures, biopsies, blood, body fluids, etc.) _7  - 0 Specimen(s) / Culture(s) sent or taken to Lab for analysis _8  - 0 Patient Transfer (multiple staff / Civil Service fast streamer / Similar devices) _9  - 0 Simple Staple / Suture removal (25 or less) _10  - 0 Complex Staple / Suture removal (26 or more) _11  - 0 Hypo / Hyperglycemic Management (close monitor of Blood Glucose) _12  - 0 Ankle / Brachial Index (ABI) - do not check if billed separately X- 1 5 Vital Signs Has the patient been seen at the hospital within the last three years: Yes Total Score: 105 Level Of Care: New/Established - Level 3 Electronic Signature(s) Signed: 02/26/2021 5:26:48 PM By: [name] Entered By: [name] on 02/26/2021 08:40:19 -------------------------------------------------------------------------------- Encounter Discharge Information Details Patient Name: Date of Service: Wyatt Loth. 02/26/2021 8:00 A M Medical Record Number: [id number] Patient Account Number: [id number] Date of Birth/Sex: Treating RN: May 01, 1951 (70 y.o. [name] Primary Care Okie Bogacz: [name] Other Clinician: Referring Nelsie Domino: Treating Makena Murdock/Extender: Bartholome Bill in Treatment: 11 Encounter Discharge Information Items Discharge Condition: Stable Ambulatory Status: Ambulatory Discharge Destination: Home Transportation: Private Auto Accompanied By: self Schedule Follow-up Appointment: Yes Clinical Summary of Care: Patient Declined Electronic Signature(s) Signed: 02/26/2021 5:14:01 PM By: Baruch Gouty RN, BSN Entered By: Baruch Gouty on 02/26/2021 08:52:57 -------------------------------------------------------------------------------- Lower Extremity Assessment Details Patient Name: Date of Service: [name], [name] 02/26/2021 8:00 A M Medical Record Number: [id number] Patient Account Number: [id number] Date of Birth/Sex: Treating RN: 1951-05-17 (70 y.o. [name] Primary Care Darvis Croft: [name] Other Clinician: Referring Lucifer Soja: Treating Camay Pedigo/Extender: Vivia Budge Weeks in Treatment: 11 Edema Assessment Assessed: Shirlyn Goltz: No] Patrice Paradise: No] Edema: [Left: Ye] [Right: s] Calf Left: Right: Point of Measurement: From Medial Instep 38 cm Ankle Left: Right: Point of Measurement: From Medial Instep 25 cm Vascular Assessment Pulses: Dorsalis Pedis Palpable: [Right:Yes] Electronic Signature(s) Signed: 02/26/2021 5:12:26 PM By: Levan Hurst RN, BSN Entered By: Levan Hurst on 02/26/2021 08:16:49 -------------------------------------------------------------------------------- Multi Wound Chart Details Patient Name: Date of Service: Wyatt Loth. 02/26/2021 8:00 A M Medical Record Number: [id number] Patient Account Number: [id number] Date of Birth/Sex: Treating RN: 1951-04-12 (70 y.o. [name] Primary Care Joda Braatz: [name] Other Clinician: Referring Masae Lukacs: Treating Karee Christopherson/Extender: Vivia Budge Weeks in Treatment: 11 Vital Signs Height(in): 72 Pulse(bpm): 92 Weight(lbs): 162 Blood Pressure(mmHg): 121/76 Body Mass  Index(BMI): 22 Temperature(F): 97.8 Respiratory Rate(breaths/min): 17 Photos: [[name]:[name]] Right T Great oe Right, Distal, Medial Lower Leg [name] Wound Location: Gradually Appeared Gradually Appeared [name] Wounding Event: Arterial Insufficiency Ulcer Venous Leg Ulcer [name] Primary Etiology: Coronary Artery Disease, Peripheral Coronary Artery Disease, Peripheral [name] Comorbid History: Arterial Disease, Peripheral Venous Arterial Disease, Peripheral Venous Disease Disease 09/18/2020 12/11/2020 [name] Date Acquired: 11 11 [name] Weeks of Treatment: Open Open [name] Wound Status: 0.1x0.1x0.1 0.5x0.3x0.4 [name] Measurements L x W  x D (cm) 0.008 0.118 [name] A (cm) : rea 0.001 0.047 [name] Volume (cm) : 99.50% 28.50% [name] % Reduction in A rea: 99.80% -42.40% [name] % Reduction in Volume: 12 Position 1 (o'clock): 0.6 Maximum Distance 1 (cm): No Yes [name] Tunneling: Full Thickness With Exposed Support Full Thickness Without Exposed [name] Classification: Structures Support Structures Small Medium [name] Exudate A mount: Serous Serosanguineous [name] Exudate Type: amber red, brown [name] Exudate Color: Distinct, outline attached Distinct, outline attached [name] Wound Margin: None Present (0%) Large (67-100%) [name] Granulation Amount: [name] Red [name] Granulation Quality: Large (67-100%) None Present (0%) [name] Necrotic Amount: Eschar [name] [name] Necrotic Tissue: Fat Layer (Subcutaneous Tissue): Yes Fat Layer (Subcutaneous Tissue): Yes [name] Exposed Structures: Bone: Yes Fascia: No Fascia: No Tendon: No Tendon: No Muscle: No Muscle: No Joint: No Joint: No Bone: No Large (67-100%) Small (1-33%) [name] Epithelialization: Treatment Notes Wound #2 (Toe Great) Wound Laterality: Right Cleanser Soap and Water Discharge Instruction: May shower and wash wound with dial antibacterial soap and water prior to dressing change. Wound Cleanser Discharge Instruction: Cleanse the wound with wound cleanser prior to applying a clean  dressing using gauze sponges, not tissue or cotton balls. Peri-Wound Care Topical Primary Dressing Betadine Discharge Instruction: apply daily Secondary Dressing Secured With Compression Wrap Compression Stockings Add-Ons Wound #3 (Lower Leg) Wound Laterality: Right, Medial, Distal Cleanser Soap and Water Discharge Instruction: May shower and wash wound with dial antibacterial soap and water prior to dressing change. Wound Cleanser Discharge Instruction: Cleanse the wound with wound cleanser prior to applying a clean dressing using gauze sponges, not tissue or cotton balls. Peri-Wound Care Sween Lotion (Moisturizing lotion) Discharge Instruction: Apply moisturizing lotion as directed Topical Primary Dressing KerraCel Ag Gelling Fiber Dressing, 2x2 in (silver alginate) Discharge Instruction: lightly pack into undermining and wound bed , change every other day after wrap removed Secondary Dressing Woven Gauze Sponge, Non-Sterile 4x4 in Discharge Instruction: Apply over primary dressing as directed. Secured With Compression Wrap Kerlix Roll 4.5x3.1 (in/yd) Discharge Instruction: Apply Kerlix and Coban compression , remove wrap next Friday or Saturday. Coban Self-Adherent Wrap 4x5 (in/yd) Discharge Instruction: Apply over Kerlix as directed. Compression Stockings Add-Ons Electronic Signature(s) Signed: 02/26/2021 9:01:09 AM By: Kalman Shan DO Signed: 02/26/2021 5:26:48 PM By: [name] Entered By: Kalman Shan on 02/26/2021 08:53:07 -------------------------------------------------------------------------------- Multi-Disciplinary Care Plan Details Patient Name: Date of Service: Wyatt Loth. 02/26/2021 8:00 A M Medical Record Number: [id number] Patient Account Number: [id number] Date of Birth/Sex: Treating RN: 21-May-1951 (70 y.o. [name] Primary Care Airianna Kreischer: [name] Other Clinician: Referring Shawnda Mauney: Treating Deeana Atwater/Extender: Bartholome Bill in Treatment: 11 Active Inactive Wound/Skin Impairment Nursing Diagnoses: Impaired tissue integrity Knowledge deficit related to ulceration/compromised skin integrity Goals: Patient will have a decrease in wound volume by X% from date: (specify in notes) Date Initiated: 12/07/2020 Date Inactivated: 02/02/2021 Target Resolution Date: 03/02/2021 Goal Status: Met Patient/caregiver will verbalize understanding of skin care regimen Date Initiated: 12/07/2020 Target Resolution Date: 03/02/2021 Goal Status: Active Ulcer/skin breakdown will have a volume reduction of 30% by week 4 Date Initiated: 12/07/2020 Date Inactivated: 02/02/2021 Target Resolution Date: 01/13/2021 Goal Status: Met Ulcer/skin breakdown will have a volume reduction of 50% by week 8 Date Initiated: 02/02/2021 Target Resolution Date: 03/02/2021 Goal Status: Active Interventions: Assess patient/caregiver ability to obtain necessary supplies Assess patient/caregiver ability to perform ulcer/skin care regimen upon admission and as needed Assess ulceration(s) every visit Notes: Electronic Signature(s) Signed: 02/26/2021 5:26:48 PM By: [name] Entered By:  [name] on 02/26/2021 08:39:09 -------------------------------------------------------------------------------- Pain Assessment Details Patient Name: Date of Service: [name], [name] 02/26/2021 8:00 A M Medical Record Number: [id number] Patient Account Number: [id number] Date of Birth/Sex: Treating RN: Nov 11, 1950 (70 y.o. [name] Primary Care Michaila Kenney: [name] Other Clinician: Referring Kairy Folsom: Treating Lynette Topete/Extender: Bartholome Bill in Treatment: 11 Active Problems Location of Pain Severity and Description of Pain Patient Has Paino No Site Locations Pain Management and Medication Current Pain Management: Electronic Signature(s) Signed: 02/26/2021 5:26:48 PM By: [name] Signed: 02/28/2021 9:12:52 AM By: Sandre Kitty Entered By: Sandre Kitty on 02/26/2021 08:07:03 -------------------------------------------------------------------------------- Patient/Caregiver Education Details Patient Name: Date of Service: [name], Wyatt YNE L. 8/22/2022andnbsp8:00 [place] Record Number: [id number] Patient Account Number: [id number] Date of Birth/Gender: Treating RN: April 23, 1951 (70 y.o. [name] Primary Care Physician: [name] Other Clinician: Referring Physician: Treating Physician/Extender: Bartholome Bill in Treatment: 11 Education Assessment Education Provided To: Patient Education Topics Provided Venous: Methods: Explain/Verbal, Printed Responses: State content correctly Wound/Skin Impairment: Methods: Explain/Verbal, Printed Responses: State content correctly Electronic Signature(s) Signed: 02/26/2021 5:26:48 PM By: [name] Entered By: [name] on 02/26/2021 08:39:28 -------------------------------------------------------------------------------- Wound Assessment Details Patient Name: Date of Service: Wyatt Loth. 02/26/2021 8:00 A M Medical Record Number: [id number] Patient Account Number: [id number] Date of Birth/Sex: Treating RN: [birth year] (70 y.o. [name] Primary Care Cherisse Carrell: [name] Other Clinician: Referring Saivon Prowse: Treating Ladarious Kresse/Extender: Vivia Budge Weeks in Treatment: 11 Wound Status Wound Number: 2 Primary Arterial Insufficiency Ulcer Etiology: Wound Location: Right T Great oe Wound Open Wounding Event: Gradually Appeared Status: Date Acquired: 09/18/2020 Comorbid Coronary Artery Disease, Peripheral Arterial Disease, Weeks Of Treatment: 11 History: Peripheral Venous Disease Clustered Wound: No Photos Wound Measurements Length: (cm) 0.1 Width: (cm) 0.1 Depth: (cm) 0.1 Area: (cm) 0.008 Volume: (cm) 0.001 %  Reduction in Area: 99.5% % Reduction in Volume: 99.8% Epithelialization: Large (67-100%) Tunneling: No Undermining: No Wound Description Classification: Full Thickness With Exposed Support Structures Wound Margin: Distinct, outline attached Exudate Amount: Small Exudate Type: Serous Exudate Color: amber Foul Odor After Cleansing: No Slough/Fibrino No Wound Bed Granulation Amount: None Present (0%) Exposed Structure Necrotic Amount: Large (67-100%) Fascia Exposed: No Necrotic Quality: Eschar Fat Layer (Subcutaneous Tissue) Exposed: Yes Tendon Exposed: No Muscle Exposed: No Joint Exposed: No Bone Exposed: Yes Treatment Notes Wound #2 (Toe Great) Wound Laterality: Right Cleanser Soap and Water Discharge Instruction: May shower and wash wound with dial antibacterial soap and water prior to dressing change. Wound Cleanser Discharge Instruction: Cleanse the wound with wound cleanser prior to applying a clean dressing using gauze sponges, not tissue or cotton balls. Peri-Wound Care Topical Primary Dressing Betadine Discharge Instruction: apply daily Secondary Dressing Secured With Compression Wrap Compression Stockings Add-Ons Electronic Signature(s) Signed: 02/26/2021 5:12:26 PM By: Levan Hurst RN, BSN Signed: 02/26/2021 5:26:48 PM By: [name] Entered By: Levan Hurst on 02/26/2021 15:17:61 -------------------------------------------------------------------------------- Wound Assessment Details Patient Name: Date of Service: Wyatt Loth. 02/26/2021 8:00 A M Medical Record Number: [id number] Patient Account Number: [id number] Date of Birth/Sex: Treating RN: 08/31/1950 (70 y.o. [name] Primary Care Ugo Thoma: [name] Other Clinician: Referring Sharnika Binney: Treating Reena Borromeo/Extender: Vivia Budge Weeks in Treatment: 11 Wound Status Wound Number: 3 Primary Venous Leg Ulcer Etiology: Wound Location: Right, Distal, Medial  Lower Leg Wound Open Wounding Event: Gradually Appeared Status: Date Acquired: 12/11/2020 Comorbid Coronary Artery Disease, Peripheral Arterial Disease, Weeks Of Treatment: 11 History: Peripheral Venous  Disease Clustered Wound: No Photos Wound Measurements Length: (cm) 0.5 Width: (cm) 0.3 Depth: (cm) 0.4 Area: (cm) 0.118 Volume: (cm) 0.047 % Reduction in Area: 28.5% % Reduction in Volume: -42.4% Epithelialization: Small (1-33%) Tunneling: Yes Position (o'clock): 12 Maximum Distance: (cm) 0.6 Undermining: No Wound Description Classification: Full Thickness Without Exposed Support Structures Wound Margin: Distinct, outline attached Exudate Amount: Medium Exudate Type: Serosanguineous Exudate Color: red, brown Foul Odor After Cleansing: No Slough/Fibrino No Wound Bed Granulation Amount: Large (67-100%) Exposed Structure Granulation Quality: Red Fascia Exposed: No Necrotic Amount: None Present (0%) Fat Layer (Subcutaneous Tissue) Exposed: Yes Tendon Exposed: No Muscle Exposed: No Joint Exposed: No Bone Exposed: No Treatment Notes Wound #3 (Lower Leg) Wound Laterality: Right, Medial, Distal Cleanser Soap and Water Discharge Instruction: May shower and wash wound with dial antibacterial soap and water prior to dressing change. Wound Cleanser Discharge Instruction: Cleanse the wound with wound cleanser prior to applying a clean dressing using gauze sponges, not tissue or cotton balls. Peri-Wound Care Sween Lotion (Moisturizing lotion) Discharge Instruction: Apply moisturizing lotion as directed Topical Primary Dressing KerraCel Ag Gelling Fiber Dressing, 2x2 in (silver alginate) Discharge Instruction: lightly pack into undermining and wound bed , change every other day after wrap removed Secondary Dressing Woven Gauze Sponge, Non-Sterile 4x4 in Discharge Instruction: Apply over primary dressing as directed. Secured With Compression Wrap Kerlix Roll 4.5x3.1  (in/yd) Discharge Instruction: Apply Kerlix and Coban compression , remove wrap next Friday or Saturday. Coban Self-Adherent Wrap 4x5 (in/yd) Discharge Instruction: Apply over Kerlix as directed. Compression Stockings Add-Ons Electronic Signature(s) Signed: 02/26/2021 5:12:26 PM By: Levan Hurst RN, BSN Signed: 02/26/2021 5:26:48 PM By: [name] Entered By: Levan Hurst on 02/26/2021 08:16:40 -------------------------------------------------------------------------------- Vitals Details Patient Name: Date of Service: Wyatt Loth. 02/26/2021 8:00 A M Medical Record Number: [id number] Patient Account Number: [id number] Date of Birth/Sex: Treating RN: 07/24/1950 (70 y.o. [name] Primary Care Anais Koenen: [name] Other Clinician: Referring Edvardo Honse: Treating Carrine Kroboth/Extender: Bartholome Bill in Treatment: 11 Vital Signs Time Taken: 08:06 Temperature (F): 97.8 Height (in): 72 Pulse (bpm): 92 Weight (lbs): 162 Respiratory Rate (breaths/min): 17 Body Mass Index (BMI): 22 Blood Pressure (mmHg): 121/76 Reference Range: 80 - 120 mg / dl Electronic Signature(s) Signed: 02/28/2021 9:12:52 AM By: Sandre Kitty Entered By: Sandre Kitty on 02/26/2021 08:06:58

## 2021-03-02 ENCOUNTER — Other Ambulatory Visit: Payer: Self-pay | Admitting: Pharmacist

## 2021-03-05 ENCOUNTER — Ambulatory Visit: Payer: Self-pay | Admitting: General Surgery

## 2021-03-05 ENCOUNTER — Encounter (HOSPITAL_BASED_OUTPATIENT_CLINIC_OR_DEPARTMENT_OTHER): Payer: PPO | Admitting: Internal Medicine

## 2021-03-05 ENCOUNTER — Other Ambulatory Visit: Payer: Self-pay

## 2021-03-05 DIAGNOSIS — L97519 Non-pressure chronic ulcer of other part of right foot with unspecified severity: Secondary | ICD-10-CM | POA: Diagnosis not present

## 2021-03-05 DIAGNOSIS — K4020 Bilateral inguinal hernia, without obstruction or gangrene, not specified as recurrent: Secondary | ICD-10-CM | POA: Diagnosis not present

## 2021-03-05 NOTE — H&P (Signed)
Subjective   Chief Complaint: Hernia       History of Present Illness: [name] is a 70 y.o. male who is seen today as an office consultation at the request of Dr. None for evaluation of Hernia .     Patient is a 70 year old male who follows back up secondary to bilateral hernias. Patient states that hernias been causing him more discomfort.  The left side has gotten larger.  He states that he has had more constipation.  He is currently on a stool softener which is helped.   Patient continues to be active.   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  Patient sees Dr. Stanford Breed.       Review of Systems: A complete review of systems was obtained from the patient.  I have reviewed this information and discussed as appropriate with the patient.  See HPI as well for other ROS.   Review of Systems  Constitutional: Negative for fever.  HENT: Negative for congestion.   Eyes: Negative for blurred vision.  Respiratory: Negative for cough, shortness of breath and wheezing.   Cardiovascular: Negative for chest pain and palpitations.  Gastrointestinal: Negative for heartburn.  Genitourinary: Negative for dysuria.  Musculoskeletal: Negative for myalgias.  Skin: Negative for rash.  Neurological: Negative for dizziness and headaches.  Psychiatric/Behavioral: Negative for depression and suicidal ideas.  All other systems reviewed and are negative.       Medical History: Past Medical History History reviewed. No pertinent past medical history.    There is no problem list on file for this patient.     Past Surgical History Past Surgical History: Procedure Laterality Date  vascular bypass Right        Allergies Allergies Allergen Reactions  Penicillins Other (See Comments)     Can't recall, childhood allergy        Current Outpatient Medications on File Prior to Visit Medication Sig Dispense Refill  cilostazoL (PLETAL)  50 MG tablet Take 1 tablet by mouth 2 (two) times daily      rivaroxaban (XARELTO) 2.5 mg tablet Take 1 tablet by mouth 2 (two) times daily       No current facility-administered medications on file prior to visit.     Family History History reviewed. No pertinent family history.     Social History   Tobacco Use Smoking Status Former Smoker Smokeless Tobacco Never Used     Social History Social History    Socioeconomic History  Marital status: Married Tobacco Use  Smoking status: Former Smoker  Smokeless tobacco: Never Used Surveyor, mining Use: Never used Substance and Sexual Activity  Alcohol use: Yes  Drug use: Never      Objective:     Vitals:   03/05/21 1545 BP: 124/64 Pulse: 98 Temp: 36.7 C (98 F) SpO2: 98% Weight: 76 kg (167 lb 9.6 oz) Height: 182.9 cm (6') PainSc: 0-No pain   Body mass index is 22.73 kg/m. Physical Exam Constitutional:      Appearance: Normal appearance.  HENT:     Head: Normocephalic and atraumatic.     Nose: Nose normal. No congestion.     Mouth/Throat:     Mouth: Mucous membranes are moist.     Pharynx: Oropharynx is clear.  Eyes:     Pupils: Pupils are equal, round, and reactive to light.  Cardiovascular:     Rate and Rhythm: Normal rate and regular rhythm.  Pulses: Normal pulses.     Heart sounds: Normal heart sounds. No murmur heard.   No friction rub. No gallop.  Pulmonary:     Effort: Pulmonary effort is normal. No respiratory distress.     Breath sounds: Normal breath sounds. No stridor. No wheezing, rhonchi or rales.  Abdominal:     General: Abdomen is flat.     Hernia: A hernia is present. Hernia is present in the left inguinal area and right inguinal area.  Musculoskeletal:        General: Normal range of motion.     Cervical back: Normal range of motion.  Skin:    General: Skin is warm and dry.  Neurological:     General: No focal deficit present.     Mental Status: He is alert and oriented to  person, place, and time.  Psychiatric:        Mood and Affect: Mood normal.        Thought Content: Thought content normal.          Assessment and Plan: Diagnoses and all orders for this visit:   Bilateral inguinal hernia without obstruction or gangrene, recurrence not specified     [name] is a 70 y.o. 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  We will reach out to Dr. Kimber Relic see if he can be off his Xarelto.   Once we have received clearance we will proceed with scheduling.   1.  We will proceed to the OR for a robotic bilateral inguinal hernia repair with mesh. 2. All risks and benefits were discussed with the patient, to generally include infection, bleeding, damage to surrounding structures, acute and chronic nerve pain, and recurrence. Alternatives were offered and described.  All questions were answered and the patient voiced understanding of the procedure and wishes to proceed at this point.             No follow-ups on file.   Ralene Ok, MD, [facility], [place] [facility]

## 2021-03-05 NOTE — Progress Notes (Signed)
CULLEY, PLANO ([id number]) Visit Report for 03/05/2021 SuperBill Details Patient Name: Date of Service: [name], [name] 03/05/2021 Medical Record Number: [id number] Patient Account Number: [id number] Date of Birth/Sex: Treating RN: [birth year] (70 y.o. 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

## 2021-03-05 NOTE — Progress Notes (Signed)
[name], [name] ([id number]) Visit Report for 03/05/2021 Arrival Information Details Patient Name: Date of Service: MESHULEM, DUNAGAN 03/05/2021 7:30 A M Medical Record Number: [id number] Patient Account Number: [id number] Date of Birth/Sex: Treating RN: 1950/09/14 (70 y.o. [name], Meta.Reding Primary Care Kambre Messner: [name] Other Clinician: Referring Baylin Cabal: Treating Nylan Nevel/Extender: Bartholome Bill in Treatment: 12 Visit Information History Since Last Visit Added or deleted any medications: No Patient Arrived: Ambulatory Any new allergies or adverse reactions: No Arrival Time: 07:51 Had a fall or experienced change in No Accompanied By: self activities of daily living that may affect Transfer Assistance: None risk of falls: Patient Identification Verified: Yes Signs or symptoms of abuse/neglect since last visito No Secondary Verification Process Completed: Yes Hospitalized since last visit: No Patient Requires Transmission-Based Precautions: No Implantable device outside of the clinic excluding No Patient Has Alerts: Yes cellular tissue based products placed in the center Patient Alerts: Patient on Blood Thinner since last visit: ABI R=0.95 Has Dressing in Place as Prescribed: Yes ABI L=1.21 Has Compression in Place as Prescribed: Yes Pain Present Now: No Electronic Signature(s) Signed: 03/05/2021 5:13:20 PM By: Deon Pilling Entered By: Deon Pilling on 03/05/2021 08:15:13 -------------------------------------------------------------------------------- Clinic Level of Care Assessment Details Patient Name: Date of Service: [name], [name] 03/05/2021 7:30 A M Medical Record Number: [id number] Patient Account Number: [id number] Date of Birth/Sex: Treating RN: [birth year] (70 y.o. [name], Meta.Reding Primary Care Stacey Sago: [name] Other Clinician: Referring Rut Betterton: Treating Mady Oubre/Extender: Bartholome Bill in  Treatment: 12 Clinic Level of Care Assessment Items TOOL 4 Quantity Score X- 1 0 Use when only an EandM is performed on FOLLOW-UP visit ASSESSMENTS - Nursing Assessment / Reassessment X- 1 10 Reassessment of Co-morbidities (includes updates in patient status) X- 1 5 Reassessment of Adherence to Treatment Plan ASSESSMENTS - Wound and Skin A ssessment / Reassessment '[]'$  - 0 Simple Wound Assessment / Reassessment - one wound X- 2 5 Complex Wound Assessment / Reassessment - multiple wounds X- 1 10 Dermatologic / Skin Assessment (not related to wound area) ASSESSMENTS - Focused Assessment X- 1 5 Circumferential Edema Measurements - multi extremities X- 1 10 Nutritional Assessment / Counseling / Intervention '[]'$  - 0 Lower Extremity Assessment (monofilament, tuning fork, pulses) '[]'$  - 0 Peripheral Arterial Disease Assessment (using hand held doppler) ASSESSMENTS - Ostomy and/or Continence Assessment and Care '[]'$  - 0 Incontinence Assessment and Management '[]'$  - 0 Ostomy Care Assessment and Management (repouching, etc.) PROCESS - Coordination of Care '[]'$  - 0 Simple Patient / Family Education for ongoing care X- 1 20 Complex (extensive) Patient / Family Education for ongoing care X- 1 10 Staff obtains Programmer, systems, Records, T Results / Process Orders est '[]'$  - 0 Staff telephones HHA, Nursing Homes / Clarify orders / etc '[]'$  - 0 Routine Transfer to another Facility (non-emergent condition) '[]'$  - 0 Routine Hospital Admission (non-emergent condition) '[]'$  - 0 New Admissions / Biomedical engineer / Ordering NPWT Apligraf, etc. , '[]'$  - 0 Emergency Hospital Admission (emergent condition) '[]'$  - 0 Simple Discharge Coordination X- 1 15 Complex (extensive) Discharge Coordination PROCESS - Special Needs '[]'$  - 0 Pediatric / Minor Patient Management '[]'$  - 0 Isolation Patient Management '[]'$  - 0 Hearing / Language / Visual special needs '[]'$  - 0 Assessment of Community assistance (transportation,  D/C planning, etc.) '[]'$  - 0 Additional assistance / Altered mentation '[]'$  - 0 Support Surface(s) Assessment (bed, cushion, seat, etc.) INTERVENTIONS - Wound Cleansing / Measurement '[]'$  - 0 Simple  Wound Cleansing - one wound X- 2 5 Complex Wound Cleansing - multiple wounds X- 1 5 Wound Imaging (photographs - any number of wounds) '[]'$  - 0 Wound Tracing (instead of photographs) '[]'$  - 0 Simple Wound Measurement - one wound X- 2 5 Complex Wound Measurement - multiple wounds INTERVENTIONS - Wound Dressings X - Small Wound Dressing one or multiple wounds 1 10 '[]'$  - 0 Medium Wound Dressing one or multiple wounds X- 1 20 Large Wound Dressing one or multiple wounds '[]'$  - 0 Application of Medications - topical '[]'$  - 0 Application of Medications - injection INTERVENTIONS - Miscellaneous '[]'$  - 0 External ear exam '[]'$  - 0 Specimen Collection (cultures, biopsies, blood, body fluids, etc.) '[]'$  - 0 Specimen(s) / Culture(s) sent or taken to Lab for analysis '[]'$  - 0 Patient Transfer (multiple staff / Civil Service fast streamer / Similar devices) '[]'$  - 0 Simple Staple / Suture removal (25 or less) '[]'$  - 0 Complex Staple / Suture removal (26 or more) '[]'$  - 0 Hypo / Hyperglycemic Management (close monitor of Blood Glucose) '[]'$  - 0 Ankle / Brachial Index (ABI) - do not check if billed separately X- 1 5 Vital Signs Has the patient been seen at the hospital within the last three years: Yes Total Score: 155 Level Of Care: New/Established - Level 4 Electronic Signature(s) Signed: 03/05/2021 5:13:20 PM By: Deon Pilling Entered By: Deon Pilling on 03/05/2021 08:16:54 -------------------------------------------------------------------------------- Encounter Discharge Information Details Patient Name: Date of Service: [name] [name]. 03/05/2021 7:30 A M Medical Record Number: [id number] Patient Account Number: [id number] Date of Birth/Sex: Treating RN: 06-21-1951 (70 y.o. [name] Primary Care Ajahni Nay: [name] Other Clinician: Referring Levis Nazir: Treating Kelsye Loomer/Extender: Bartholome Bill in Treatment: 12 Encounter Discharge Information Items Discharge Condition: Stable Ambulatory Status: Ambulatory Discharge Destination: Home Transportation: Private Auto Accompanied By: self Schedule Follow-up Appointment: Yes Clinical Summary of Care: Electronic Signature(s) Signed: 03/05/2021 5:13:20 PM By: Deon Pilling Entered By: Deon Pilling on 03/05/2021 08:16:26 -------------------------------------------------------------------------------- Patient/Caregiver Education Details Patient Name: Date of Service: [name] [name] 8/29/2022andnbsp7:30 [place] Record Number: [id number] Patient Account Number: [id number] Date of Birth/Gender: Treating RN: 1950-09-07 (70 y.o. [name] Primary Care Physician: [name] Other Clinician: Referring Physician: Treating Physician/Extender: Bartholome Bill in Treatment: 12 Education Assessment Education Provided To: Patient Education Topics Provided Wound/Skin Impairment: Handouts: Skin Care Do's and Dont's Methods: Explain/Verbal Responses: Reinforcements needed Electronic Signature(s) Signed: 03/05/2021 5:13:20 PM By: Deon Pilling Entered By: Deon Pilling on 03/05/2021 08:15:58 -------------------------------------------------------------------------------- Wound Assessment Details Patient Name: Date of Service: [name], [name] 03/05/2021 7:30 A M Medical Record Number: [id number] Patient Account Number: [id number] Date of Birth/Sex: Treating RN: 1951-01-03 (70 y.o. [name], Meta.Reding Primary Care Sourish Allender: [name] Other Clinician: Referring Shawntia Mangal: Treating Alban Marucci/Extender: Vivia Budge Weeks in Treatment: 12 Wound Status Wound Number: 2 Primary Etiology: Arterial Insufficiency Ulcer Wound Location: Right T Great oe Wound Status: Open Wounding  Event: Gradually Appeared Date Acquired: 09/18/2020 Weeks Of Treatment: 12 Clustered Wound: No Wound Measurements Length: (cm) 0.1 Width: (cm) 0.1 Depth: (cm) 0.1 Area: (cm) 0.008 Volume: (cm) 0.001 % Reduction in Area: 99.5% % Reduction in Volume: 99.8% Wound Description Classification: Full Thickness With Exposed Support Structure Exudate Amount: Small Exudate Type: Serous Exudate Color: amber s Treatment Notes Wound #2 (Toe Great) Wound Laterality: Right Cleanser Soap and Water Discharge Instruction: May shower and wash wound with dial antibacterial soap and water prior to dressing change. Wound  Cleanser Discharge Instruction: Cleanse the wound with wound cleanser prior to applying a clean dressing using gauze sponges, not tissue or cotton balls. Peri-Wound Care Topical Primary Dressing Betadine Discharge Instruction: apply daily Secondary Dressing Woven Gauze Sponge, Non-Sterile 4x4 in Discharge Instruction: Apply over primary dressing as directed. Secured With 42M Medipore H Soft Cloth Surgical T 4 x 2 (in/yd) ape Discharge Instruction: Secure dressing with tape as directed. Compression Wrap Compression Stockings Add-Ons Electronic Signature(s) Signed: 03/05/2021 5:13:20 PM By: Deon Pilling Entered By: Deon Pilling on 03/05/2021 08:15:36 -------------------------------------------------------------------------------- Wound Assessment Details Patient Name: Date of Service: [name], [name] 03/05/2021 7:30 A M Medical Record Number: [id number] Patient Account Number: [id number] Date of Birth/Sex: Treating RN: May 01, 1951 (70 y.o. [name], Meta.Reding Primary Care Sumi Lye: [name] Other Clinician: Referring Oris Calmes: Treating Marquize Seib/Extender: Vivia Budge Weeks in Treatment: 12 Wound Status Wound Number: 3 Primary Etiology: Venous Leg Ulcer Wound Location: Right, Distal, Medial Lower Leg Wound Status: Open Wounding Event:  Gradually Appeared Date Acquired: 12/11/2020 Weeks Of Treatment: 12 Clustered Wound: No Wound Measurements Length: (cm) 0.5 Width: (cm) 0.3 Depth: (cm) 0.4 Area: (cm) 0.118 Volume: (cm) 0.047 % Reduction in Area: 28.5% % Reduction in Volume: -42.4% Wound Description Classification: Full Thickness Without Exposed Support Structu Exudate Amount: Medium Exudate Type: Serosanguineous Exudate Color: red, brown res Treatment Notes Wound #3 (Lower Leg) Wound Laterality: Right, Medial, Distal Cleanser Soap and Water Discharge Instruction: May shower and wash wound with dial antibacterial soap and water prior to dressing change. Wound Cleanser Discharge Instruction: Cleanse the wound with wound cleanser prior to applying a clean dressing using gauze sponges, not tissue or cotton balls. Peri-Wound Care Sween Lotion (Moisturizing lotion) Discharge Instruction: Apply moisturizing lotion as directed Topical Primary Dressing KerraCel Ag Gelling Fiber Dressing, 2x2 in (silver alginate) Discharge Instruction: lightly pack into undermining and wound bed , change every other day after wrap removed Secondary Dressing Woven Gauze Sponge, Non-Sterile 4x4 in Discharge Instruction: Apply over primary dressing as directed. Secured With Compression Wrap Kerlix Roll 4.5x3.1 (in/yd) Discharge Instruction: Apply Kerlix and Coban compression , remove wrap next Friday or Saturday. Coban Self-Adherent Wrap 4x5 (in/yd) Discharge Instruction: Apply over Kerlix as directed. Compression Stockings Add-Ons Electronic Signature(s) Signed: 03/05/2021 5:13:20 PM By: Deon Pilling Entered By: Deon Pilling on 03/05/2021 08:15:36 -------------------------------------------------------------------------------- Vitals Details Patient Name: Date of Service: [name] [name]. 03/05/2021 7:30 A M Medical Record Number: [id number] Patient Account Number: [id number] Date of Birth/Sex: Treating RN: Sep 07, 1950 (70  y.o. [name] Primary Care Marsa Matteo: [name] Other Clinician: Referring Veera Stapleton: Treating Alexxa Sabet/Extender: Bartholome Bill in Treatment: 12 Vital Signs Time Taken: 07:51 Temperature (F): 97.5 Height (in): 72 Pulse (bpm): 72 Weight (lbs): 162 Respiratory Rate (breaths/min): 18 Body Mass Index (BMI): 22 Blood Pressure (mmHg): 142/72 Reference Range: 80 - 120 mg / dl Electronic Signature(s) Signed: 03/05/2021 5:13:20 PM By: Deon Pilling Entered By: Deon Pilling on 03/05/2021 08:15:26

## 2021-03-08 ENCOUNTER — Ambulatory Visit (HOSPITAL_COMMUNITY)
Admission: RE | Admit: 2021-03-08 | Discharge: 2021-03-08 | Disposition: A | Discharge status: Home / Self Care | Payer: PPO | Source: Ambulatory Visit | Attending: Physician Assistant | Admitting: Physician Assistant

## 2021-03-08 ENCOUNTER — Other Ambulatory Visit (HOSPITAL_COMMUNITY): Payer: Self-pay | Admitting: Physician Assistant

## 2021-03-08 ENCOUNTER — Other Ambulatory Visit: Payer: Self-pay

## 2021-03-08 DIAGNOSIS — S91109D Unspecified open wound of unspecified toe(s) without damage to nail, subsequent encounter: Secondary | ICD-10-CM

## 2021-03-08 DIAGNOSIS — M810 Age-related osteoporosis without current pathological fracture: Secondary | ICD-10-CM | POA: Diagnosis not present

## 2021-03-08 DIAGNOSIS — S91101A Unspecified open wound of right great toe without damage to nail, initial encounter: Secondary | ICD-10-CM | POA: Diagnosis not present

## 2021-03-08 DIAGNOSIS — M7989 Other specified soft tissue disorders: Secondary | ICD-10-CM | POA: Diagnosis not present

## 2021-03-13 ENCOUNTER — Other Ambulatory Visit: Payer: Self-pay

## 2021-03-13 ENCOUNTER — Encounter (HOSPITAL_BASED_OUTPATIENT_CLINIC_OR_DEPARTMENT_OTHER): Payer: PPO | Attending: Internal Medicine | Admitting: Internal Medicine

## 2021-03-13 DIAGNOSIS — L97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 with unspecified severity: Secondary | ICD-10-CM | POA: Insufficient documentation

## 2021-03-13 DIAGNOSIS — L97519 Non-pressure chronic ulcer of other part of right foot with unspecified severity: Secondary | ICD-10-CM | POA: Insufficient documentation

## 2021-03-13 DIAGNOSIS — M86671 Other chronic osteomyelitis, right ankle and foot: Secondary | ICD-10-CM | POA: Insufficient documentation

## 2021-03-13 DIAGNOSIS — I70235 Atherosclerosis of native arteries of right leg with ulceration of other part of foot: Secondary | ICD-10-CM | POA: Diagnosis not present

## 2021-03-13 DIAGNOSIS — I70238 Atherosclerosis of native arteries of right leg with ulceration of other part of lower right leg: Secondary | ICD-10-CM | POA: Diagnosis not present

## 2021-03-13 DIAGNOSIS — T8130XD Disruption of wound, unspecified, subsequent encounter: Secondary | ICD-10-CM | POA: Insufficient documentation

## 2021-03-13 DIAGNOSIS — I739 Peripheral vascular disease, unspecified: Secondary | ICD-10-CM | POA: Diagnosis not present

## 2021-03-13 DIAGNOSIS — X58XXXD Exposure to other specified factors, subsequent encounter: Secondary | ICD-10-CM | POA: Insufficient documentation

## 2021-03-19 NOTE — Progress Notes (Signed)
DAELIN, HASTE ([id number]) Visit Report for 03/13/2021 Arrival Information Details Patient Name: Date of Service: [name], [name] 03/13/2021 8:15 A M Medical Record Number: [id number] Patient Account Number: [id number] Date of Birth/Sex: Treating RN: [birth year] (70 y.o. [name], [name] Primary Care [name]: [name] Other Clinician: Referring Neithan Day: Treating Dervin Vore/Extender: Bartholome Bill in Treatment: 13 Visit Information History Since Last Visit Added or deleted any medications: No Patient Arrived: Ambulatory Any new allergies or adverse reactions: No Arrival Time: 08:22 Had a fall or experienced change in No Accompanied By: self activities of daily living that may affect Transfer Assistance: None risk of falls: Patient Identification Verified: Yes Signs or symptoms of abuse/neglect since last visito No Secondary Verification Process Completed: Yes Hospitalized since last visit: No Patient Requires Transmission-Based Precautions: No Implantable device outside of the clinic excluding No Patient Has Alerts: Yes cellular tissue based products placed in the center Patient Alerts: Patient on Blood Thinner since last visit: ABI R=0.95 Has Dressing in Place as Prescribed: Yes ABI L=1.21 Has Compression in Place as Prescribed: Yes Pain Present Now: No Electronic Signature(s) Signed: 03/14/2021 6:09:03 PM By: Baruch Gouty RN, BSN Entered By: Baruch Gouty on 03/13/2021 08:23:31 -------------------------------------------------------------------------------- Clinic Level of Care Assessment Details Patient Name: Date of Service: [name], [name] 03/13/2021 8:15 A M Medical Record Number: [id number] Patient Account Number: [id number] Date of Birth/Sex: Treating RN: 11/24/1950 (70 y.o. [name] Primary Care Teara Duerksen: [name] Other Clinician: Referring Triston Lisanti: Treating Keilyn Haggard/Extender: Bartholome Bill  in Treatment: 13 Clinic Level of Care Assessment Items TOOL 4 Quantity Score '[]'  - 0 Use when only an EandM is performed on FOLLOW-UP visit ASSESSMENTS - Nursing Assessment / Reassessment X- 1 10 Reassessment of Co-morbidities (includes updates in patient status) X- 1 5 Reassessment of Adherence to Treatment Plan ASSESSMENTS - Wound and Skin A ssessment / Reassessment '[]'  - 0 Simple Wound Assessment / Reassessment - one wound X- 2 5 Complex Wound Assessment / Reassessment - multiple wounds '[]'  - 0 Dermatologic / Skin Assessment (not related to wound area) ASSESSMENTS - Focused Assessment X- 1 5 Circumferential Edema Measurements - multi extremities '[]'  - 0 Nutritional Assessment / Counseling / Intervention X- 1 5 Lower Extremity Assessment (monofilament, tuning fork, pulses) '[]'  - 0 Peripheral Arterial Disease Assessment (using hand held doppler) ASSESSMENTS - Ostomy and/or Continence Assessment and Care '[]'  - 0 Incontinence Assessment and Management '[]'  - 0 Ostomy Care Assessment and Management (repouching, etc.) PROCESS - Coordination of Care X - Simple Patient / Family Education for ongoing care 1 15 '[]'  - 0 Complex (extensive) Patient / Family Education for ongoing care X- 1 10 Staff obtains Programmer, systems, Records, T Results / Process Orders est '[]'  - 0 Staff telephones HHA, Nursing Homes / Clarify orders / etc '[]'  - 0 Routine Transfer to another Facility (non-emergent condition) '[]'  - 0 Routine Hospital Admission (non-emergent condition) '[]'  - 0 New Admissions / Biomedical engineer / Ordering NPWT Apligraf, etc. , '[]'  - 0 Emergency Hospital Admission (emergent condition) X- 1 10 Simple Discharge Coordination '[]'  - 0 Complex (extensive) Discharge Coordination PROCESS - Special Needs '[]'  - 0 Pediatric / Minor Patient Management '[]'  - 0 Isolation Patient Management '[]'  - 0 Hearing / Language / Visual special needs '[]'  - 0 Assessment of Community assistance  (transportation, D/C planning, etc.) '[]'  - 0 Additional assistance / Altered mentation '[]'  - 0 Support Surface(s) Assessment (bed, cushion, seat, etc.) INTERVENTIONS - Wound Cleansing / Measurement '[]'  -  0 Simple Wound Cleansing - one wound X- 2 5 Complex Wound Cleansing - multiple wounds X- 1 5 Wound Imaging (photographs - any number of wounds) '[]'  - 0 Wound Tracing (instead of photographs) '[]'  - 0 Simple Wound Measurement - one wound X- 2 5 Complex Wound Measurement - multiple wounds INTERVENTIONS - Wound Dressings X - Small Wound Dressing one or multiple wounds 1 10 '[]'  - 0 Medium Wound Dressing one or multiple wounds '[]'  - 0 Large Wound Dressing one or multiple wounds X- 1 5 Application of Medications - topical '[]'  - 0 Application of Medications - injection INTERVENTIONS - Miscellaneous '[]'  - 0 External ear exam '[]'  - 0 Specimen Collection (cultures, biopsies, blood, body fluids, etc.) '[]'  - 0 Specimen(s) / Culture(s) sent or taken to Lab for analysis '[]'  - 0 Patient Transfer (multiple staff / Civil Service fast streamer / Similar devices) '[]'  - 0 Simple Staple / Suture removal (25 or less) '[]'  - 0 Complex Staple / Suture removal (26 or more) '[]'  - 0 Hypo / Hyperglycemic Management (close monitor of Blood Glucose) '[]'  - 0 Ankle / Brachial Index (ABI) - do not check if billed separately X- 1 5 Vital Signs Has the patient been seen at the hospital within the last three years: Yes Total Score: 115 Level Of Care: New/Established - Level 3 Electronic Signature(s) Signed: 03/14/2021 6:09:03 PM By: Baruch Gouty RN, BSN Entered By: Baruch Gouty on 03/13/2021 08:55:42 -------------------------------------------------------------------------------- Encounter Discharge Information Details Patient Name: Date of Service: [name]. 03/13/2021 8:15 A M Medical Record Number: [id number] Patient Account Number: [id number] Date of Birth/Sex: Treating RN: 03-21-1951 (70 y.o. [name] Primary Care [name] [name]: [name] Other Clinician: Referring Robley Matassa: Treating Caidynce Muzyka/Extender: Bartholome Bill in Treatment: 13 Encounter Discharge Information Items Discharge Condition: Stable Ambulatory Status: Ambulatory Discharge Destination: Home Transportation: Private Auto Accompanied By: self Schedule Follow-up Appointment: Yes Clinical Summary of Care: Patient Declined Electronic Signature(s) Signed: 03/14/2021 6:09:03 PM By: Baruch Gouty RN, BSN Entered By: Baruch Gouty on 03/13/2021 09:04:28 -------------------------------------------------------------------------------- Lower Extremity Assessment Details Patient Name: Date of Service: [name], [name] 03/13/2021 8:15 A M Medical Record Number: [id number] Patient Account Number: [id number] Date of Birth/Sex: Treating RN: [birth year] (70 y.o. [name] Primary Care Burlie Cajamarca: [name] Other Clinician: Referring Nakeia Calvi: Treating Cristela Stalder/Extender: Vivia Budge Weeks in Treatment: 13 Edema Assessment Assessed: Shirlyn Goltz: No] Patrice Paradise: No] Edema: [Left: Ye] [Right: s] Calf Left: Right: Point of Measurement: From Medial Instep 40.4 cm Ankle Left: Right: Point of Measurement: From Medial Instep 25 cm Vascular Assessment Pulses: Dorsalis Pedis Palpable: [Right:Yes] Electronic Signature(s) Signed: 03/14/2021 6:09:03 PM By: Baruch Gouty RN, BSN Entered By: Baruch Gouty on 03/13/2021 08:34:19 -------------------------------------------------------------------------------- Multi Wound Chart Details Patient Name: Date of Service: [name]. 03/13/2021 8:15 A M Medical Record Number: [id number] Patient Account Number: [id number] Date of Birth/Sex: Treating RN: 07-13-1950 (70 y.o. [name] Primary Care Munirah Doerner: [name] Other Clinician: Referring Leonardo Plaia: Treating Burle Kwan/Extender: Bartholome Bill in  Treatment: 13 Vital Signs Height(in): 72 Pulse(bpm): 80 Weight(lbs): 162 Blood Pressure(mmHg): 147/80 Body Mass Index(BMI): 22 Temperature(F): 97.6 Respiratory Rate(breaths/min): 18 Photos: [[name]:[name]] Right T Great oe Right, Distal, Medial Lower Leg [name] Wound Location: Gradually Appeared Gradually Appeared [name] Wounding Event: Arterial Insufficiency Ulcer Venous Leg Ulcer [name] Primary Etiology: Coronary Artery Disease, Peripheral Coronary Artery Disease, Peripheral [name] Comorbid History: Arterial Disease, Peripheral Venous Arterial Disease, Peripheral Venous Disease Disease 09/18/2020 12/11/2020 [name] Date Acquired: 13 13  [name] Weeks of Treatment: Healed - Epithelialized Open [name] Wound Status: 0x0x0 0.1x0.1x0.1 [name] Measurements L x W x D (cm) 0 0.008 [name] A (cm) : rea 0 0.001 [name] Volume (cm) : 100.00% 95.20% [name] % Reduction in Area: 100.00% 97.00% [name] % Reduction in Volume: Full Thickness With Exposed Support Full Thickness Without Exposed [name] Classification: Structures Support Structures None Present None Present [name] Exudate Amount: [name] Indistinct, nonvisible [name] Wound Margin: None Present (0%) None Present (0%) [name] Granulation Amount: None Present (0%) None Present (0%) [name] Necrotic Amount: Fascia: No Fascia: No [name] Exposed Structures: Fat Layer (Subcutaneous Tissue): No Fat Layer (Subcutaneous Tissue): No Tendon: No Tendon: No Muscle: No Muscle: No Joint: No Joint: No Bone: No Bone: No Large (67-100%) Large (67-100%) [name] Epithelialization: Treatment Notes Wound #3 (Lower Leg) Wound Laterality: Right, Medial, Distal Cleanser Soap and Water Discharge Instruction: May shower and wash wound with dial antibacterial soap and water prior to dressing change. Wound Cleanser Discharge Instruction: Cleanse the wound with wound cleanser prior to applying a clean dressing using gauze sponges, not tissue or cotton balls. Peri-Wound Care Sween Lotion (Moisturizing  lotion) Discharge Instruction: Apply moisturizing lotion as directed Topical Primary Dressing KerraCel Ag Gelling Fiber Dressing, 2x2 in (silver alginate) Discharge Instruction: lightly pack into undermining and wound bed , change every other day after wrap removed Secondary Dressing Zetuvit Plus Silicone Border Dressing 4x4 (in/in) Discharge Instruction: Apply silicone border over primary dressing as directed. Secured With Compression Wrap Compression Stockings Add-Ons Electronic Signature(s) Signed: 03/13/2021 9:19:00 AM By: Kalman Shan DO Signed: 03/19/2021 4:41:05 PM By: Fara Chute By: Kalman Shan on 03/13/2021 09:09:46 -------------------------------------------------------------------------------- Multi-Disciplinary Care Plan Details Patient Name: Date of Service: [name], [name] 03/13/2021 8:15 A M Medical Record Number: [id number] Patient Account Number: [id number] Date of Birth/Sex: Treating RN: 11-Dec-1950 (70 y.o. [name] Primary Care Anurag Scarfo: [name] Other Clinician: Referring Olene Godfrey: Treating Earley Grobe/Extender: Bartholome Bill in Treatment: 13 Multidisciplinary Care Plan reviewed with physician Active Inactive Wound/Skin Impairment Nursing Diagnoses: Impaired tissue integrity Knowledge deficit related to ulceration/compromised skin integrity Goals: Patient will have a decrease in wound volume by X% from date: (specify in notes) Date Initiated: 12/07/2020 Date Inactivated: 02/02/2021 Target Resolution Date: 03/02/2021 Goal Status: Met Patient/caregiver will verbalize understanding of skin care regimen Date Initiated: 12/07/2020 Target Resolution Date: 04/10/2021 Goal Status: Active Ulcer/skin breakdown will have a volume reduction of 30% by week 4 Date Initiated: 12/07/2020 Date Inactivated: 02/02/2021 Target Resolution Date: 01/13/2021 Goal Status: Met Ulcer/skin breakdown will have a volume reduction of  50% by week 8 Date Initiated: 02/02/2021 Date Inactivated: 03/13/2021 Target Resolution Date: 03/02/2021 Goal Status: Met Ulcer/skin breakdown will have a volume reduction of 80% by week 12 Date Initiated: 03/13/2021 Target Resolution Date: 04/02/2021 Goal Status: Active Interventions: Assess patient/caregiver ability to obtain necessary supplies Assess patient/caregiver ability to perform ulcer/skin care regimen upon admission and as needed Assess ulceration(s) every visit Notes: Electronic Signature(s) Signed: 03/14/2021 6:09:03 PM By: Baruch Gouty RN, BSN Entered By: Baruch Gouty on 03/13/2021 08:40:45 -------------------------------------------------------------------------------- Pain Assessment Details Patient Name: Date of Service: [name] 03/13/2021 8:15 A M Medical Record Number: [id number] Patient Account Number: [id number] Date of Birth/Sex: Treating RN: [birth year] (70 y.o. [name] Primary Care Denis Koppel: [name] Other Clinician: Referring Osmond Steckman: Treating Sudiksha Victor/Extender: Bartholome Bill in Treatment: 13 Active Problems Location of Pain Severity and Description of Pain Patient Has Paino No Site Locations Rate the pain. Current Pain Level:  0 Pain Management and Medication Current Pain Management: Electronic Signature(s) Signed: 03/14/2021 6:09:03 PM By: Baruch Gouty RN, BSN Entered By: Baruch Gouty on 03/13/2021 08:33:36 -------------------------------------------------------------------------------- Patient/Caregiver Education Details Patient Name: Date of Service: [name], Wyatt YNE L. 9/6/2022andnbsp8:15 A M Medical Record Number: [id number] Patient Account Number: [id number] Date of Birth/Gender: Treating RN: September 24, 1950 (70 y.o. [name] Primary Care Physician: [name] Other Clinician: Referring Physician: Treating Physician/Extender: Bartholome Bill in Treatment:  13 Education Assessment Education Provided To: Patient Education Topics Provided Venous: Methods: Explain/Verbal Responses: Reinforcements needed, State content correctly Wound/Skin Impairment: Methods: Explain/Verbal Responses: Reinforcements needed, State content correctly Electronic Signature(s) Signed: 03/14/2021 6:09:03 PM By: Baruch Gouty RN, BSN Entered By: Baruch Gouty on 03/13/2021 08:41:34 -------------------------------------------------------------------------------- Wound Assessment Details Patient Name: Date of Service: [name] 03/13/2021 8:15 A M Medical Record Number: [id number] Patient Account Number: [id number] Date of Birth/Sex: Treating RN: 03-Dec-1950 (70 y.o. [name] Primary Care Jazmin Ley: [name] Other Clinician: Referring Johanthan Kneeland: Treating Nayden Czajka/Extender: Bartholome Bill in Treatment: 13 Wound Status Wound Number: 2 Primary Arterial Insufficiency Ulcer Etiology: Wound Location: Right T Great oe Wound Healed - Epithelialized Wounding Event: Gradually Appeared Status: Date Acquired: 09/18/2020 Comorbid Coronary Artery Disease, Peripheral Arterial Disease, Weeks Of Treatment: 13 History: Peripheral Venous Disease Clustered Wound: No Photos Wound Measurements Length: (cm) Width: (cm) Depth: (cm) Area: (cm) Volume: (cm) 0 % Reduction in Area: 100% 0 % Reduction in Volume: 100% 0 Epithelialization: Large (67-100%) 0 Tunneling: No 0 Undermining: No Wound Description Classification: Full Thickness With Exposed Support Structures Exudate Amount: None Present Foul Odor After Cleansing: No Slough/Fibrino No Wound Bed Granulation Amount: None Present (0%) Exposed Structure Necrotic Amount: None Present (0%) Fascia Exposed: No Fat Layer (Subcutaneous Tissue) Exposed: No Tendon Exposed: No Muscle Exposed: No Joint Exposed: No Bone Exposed: No Electronic Signature(s) Signed: 03/14/2021  6:09:03 PM By: Baruch Gouty RN, BSN Entered By: Baruch Gouty on 03/13/2021 08:39:01 -------------------------------------------------------------------------------- Wound Assessment Details Patient Name: Date of Service: [name]. 03/13/2021 8:15 A M Medical Record Number: [id number] Patient Account Number: [id number] Date of Birth/Sex: Treating RN: 02-Jan-1951 (70 y.o. [name] Primary Care Lindsea Olivar: [name] Other Clinician: Referring Symphanie Cederberg: Treating Yamira Papa/Extender: Vivia Budge Weeks in Treatment: 13 Wound Status Wound Number: 3 Primary Venous Leg Ulcer Etiology: Wound Location: Right, Distal, Medial Lower Leg Wound Open Wounding Event: Gradually Appeared Status: Date Acquired: 12/11/2020 Comorbid Coronary Artery Disease, Peripheral Arterial Disease, Weeks Of Treatment: 13 History: Peripheral Venous Disease Clustered Wound: No Photos Wound Measurements Length: (cm) 0.1 Width: (cm) 0.1 Depth: (cm) 0.1 Area: (cm) 0.008 Volume: (cm) 0.001 % Reduction in Area: 95.2% % Reduction in Volume: 97% Epithelialization: Large (67-100%) Tunneling: No Undermining: No Wound Description Classification: Full Thickness Without Exposed Support Structures Wound Margin: Indistinct, nonvisible Exudate Amount: None Present Foul Odor After Cleansing: No Slough/Fibrino No Wound Bed Granulation Amount: None Present (0%) Exposed Structure Necrotic Amount: None Present (0%) Fascia Exposed: No Fat Layer (Subcutaneous Tissue) Exposed: No Tendon Exposed: No Muscle Exposed: No Joint Exposed: No Bone Exposed: No Treatment Notes Wound #3 (Lower Leg) Wound Laterality: Right, Medial, Distal Cleanser Soap and Water Discharge Instruction: May shower and wash wound with dial antibacterial soap and water prior to dressing change. Wound Cleanser Discharge Instruction: Cleanse the wound with wound cleanser prior to applying a clean dressing using  gauze sponges, not tissue or cotton balls. Peri-Wound Care Sween Lotion (Moisturizing lotion) Discharge Instruction: Apply moisturizing lotion as  directed Topical Primary Dressing KerraCel Ag Gelling Fiber Dressing, 2x2 in (silver alginate) Discharge Instruction: lightly pack into undermining and wound bed , change every other day after wrap removed Secondary Dressing Zetuvit Plus Silicone Border Dressing 4x4 (in/in) Discharge Instruction: Apply silicone border over primary dressing as directed. Secured With Compression Wrap Compression Stockings Environmental education officer) Signed: 03/14/2021 6:09:03 PM By: Baruch Gouty RN, BSN Entered By: Baruch Gouty on 03/13/2021 08:39:47 -------------------------------------------------------------------------------- Garner Details Patient Name: Date of Service: [name]. 03/13/2021 8:15 A M Medical Record Number: [id number] Patient Account Number: [id number] Date of Birth/Sex: Treating RN: 1950-10-27 (70 y.o. [name] Primary Care Haunani Dickard: [name] Other Clinician: Referring Zayvion Stailey: Treating Tahiri Shareef/Extender: Bartholome Bill in Treatment: 13 Vital Signs Time Taken: 08:52 Temperature (F): 97.6 Height (in): 72 Pulse (bpm): 80 Source: Stated Respiratory Rate (breaths/min): 18 Weight (lbs): 162 Blood Pressure (mmHg): 147/80 Source: Stated Reference Range: 80 - 120 mg / dl Body Mass Index (BMI): 22 Electronic Signature(s) Signed: 03/14/2021 6:09:03 PM By: Baruch Gouty RN, BSN Entered By: Baruch Gouty on 03/13/2021 08:25:31

## 2021-03-19 NOTE — Progress Notes (Signed)
[name], [name] ([id number]) Visit Report for 03/13/2021 Chief Complaint Document Details Patient Name: Date of Service: Wyatt, Jackson 03/13/2021 8:15 A M Medical Record Number: [id number] Patient Account Number: [id number] Date of Birth/Sex: Treating RN: 10/29/1950 (70 y.o. [name] Primary Care Provider: Sueanne Jackson Other Clinician: Referring Provider: Treating Provider/Extender: [name] in Treatment: 13 Information Obtained from: Patient Chief Complaint Surgical wound dehiscence of right lower extremity and Great toe wound. Electronic Signature(s) Signed: 03/13/2021 9:19:00 AM By: Wyatt Shan DO Entered By: [name] on 03/13/2021 09:09:57 -------------------------------------------------------------------------------- HPI Details Patient Name: Date of Service: [name]. 03/13/2021 8:15 A M Medical Record Number: [id number] Patient Account Number: [id number] Date of Birth/Sex: Treating RN: Jun 17, 1951 (70 y.o. [name] Primary Care Provider: Sueanne Jackson Other Clinician: Referring Provider: Treating Provider/Extender: [name] in Treatment: 13 History of Present Illness HPI Description: Admission 6/2 Mr. [name] is a 70 year old male with a past medical history of peripheral arterial disease status post right common femoral to peroneal bypass on 10/13/2020 and coil embolization of bypass fistula x2 on 11/01/2020. He presents today because he has wound dehiscence that occurred 3 weeks ago from his recent vascular surgery. He has been using wet-to-dry dressings to this area. He also has necrotic tissue to the right great toe. This was the original symptom that precipitated further vascular evaluation that led to his aortogram and ultimately bypass graft. He is currently keeping this area covered. He currently denies signs of infection. 6/6; patient presents for 1 week follow-up. He had  Kerlix/Coban compression for the past week with Hydrofera Blue underneath. He reports tolerating this well without any issues. He thinks the wound looks smaller today. He has been using Betadine to the toe wound. He has no complaints today. He denies signs of infection. 6/20; patient presents for 2-week follow-up. He had Kerlix/Coban compression with Hydrofera Blue. He has tolerated this well. He reports significant improvement in leg swelling. He also thinks the wound looks smaller. He has no complaints today and denies signs of infection. 6/27; patient presents for 1 week follow-up. He continues to use Hydrofera Blue under Kerlix/Coban compression and has tolerated this well. He continues to report improvement in leg swelling. He is content with the wound care progress at this time. He denies signs of infection. 7/5; patient presents for 1 week follow-up. He has been using Hydrofera Blue and Santyl under Kerlix/Coban. He reports tolerating this well. He has some mild tenderness to the distal portion of the right leg wound. He denies signs of infection. 7/11; patient presents for 1 week follow-up. He has been using Hydrofera Blue and Santyl under Kerlix/Coban. He has no issues or complaints today. He was started on antibiotics at last clinic visit and reports improvement in tenderness and erythema to the wound. 7/21; patient presents for 1 week follow-up. He has been using Hydrofera Blue and Santyl, silver alginate under Kerlix/Coban. He has no issues or complaints today. He reports improvement T his wound healing. Unfortunately he hit his right great toe and split the callus open a little bit. He denies signs of infection. o 7/29; patient presents for 1 week follow-up. He has tolerated the compression wrap well and has no issues or complaints today. He is going to be out of town next week and will not return until next weekend. He denies signs of infection. 02/14/2021 upon evaluation today patient  appears to be doing really about the same in  regard to his toe ulcer. Currently he did have repeat studies and evaluation with the vascular surgeon. The study showed that he has an ABI on the right 1.03 with a TBI of 0.68 and on the left and ABI 0.98 with a TBI of 0.92. Overall this seems to be doing excellent which is great news. With that being said it was noted that the patient does have some need for coil embolization of a persistent fistula in his bypass but that something that is being delayed for now as he is doing quite well. With regard to the dehiscence area there is actually signs of this healing and improving in regard to the leg. In regard to the toe this actually appears to be bone that is actually exposed in the distal portion of the toe. He has not had any x-rays up to this point that I can see. I inquired and he told me he has not gone for x-ray at this point. I think that something more than I need to do although I am going to try to see what I can do about debriding away the necrotic bone that appears to be present in the distal portion of the toe. 8/16; patient presents for 1 week follow-up. Patient states he has been on his feet more in the past week. He had a 7-hour drive yesterday. Overall he is doing well with the compression wrap to the right or left lower extremity. He is keeping the toe covered on the right foot. He has been unable to obtain the x-ray ordered at last clinic visit. He currently denies signs of infection. 8/22; patient presents for 1 week follow-up. He has no issues or concerns today. He states he is going to obtain his right foot x-ray this week. He currently denies signs of infection. 9/6; patient presents for 1 week follow-up. He reports no issues or concerns today. He obtained his right foot x-ray. He currently denies signs of infection and overall feels well. He has been trying to exercise more. Electronic Signature(s) Signed: 03/13/2021 9:19:00 AM By:  Wyatt Shan DO Entered By: [name] on 03/13/2021 09:10:35 -------------------------------------------------------------------------------- Physical Exam Details Patient Name: Date of Service: Wyatt, Jackson 03/13/2021 8:15 A M Medical Record Number: [id number] Patient Account Number: [id number] Date of Birth/Sex: Treating RN: 04-Dec-1950 (70 y.o. [name] Primary Care Provider: Sueanne Jackson Other Clinician: Referring Provider: Treating Provider/Extender: [name] in Treatment: 13 Constitutional respirations regular, non-labored and within target range for patient.. Cardiovascular 2+ dorsalis pedis/posterior tibialis pulses. Psychiatric pleasant and cooperative. Notes Right lower extremity: Small open wound with granulation tissue to the lower aspect of the leg. Good edema control. No signs of infection. T the right great toe o there is epithelialization to the distal aspect with no drainage noted. Electronic Signature(s) Signed: 03/13/2021 9:19:00 AM By: Wyatt Shan DO Entered By: [name] on 03/13/2021 09:11:15 -------------------------------------------------------------------------------- Physician Orders Details Patient Name: Date of Service: NOVIAN, SCHUENEMAN 03/13/2021 8:15 A M Medical Record Number: [id number] Patient Account Number: [id number] Date of Birth/Sex: Treating RN: 19-Apr-1951 (70 y.o. 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 - Dr. Heber [place] Return A Bathing/ Shower/ Hygiene May shower and wash wound with soap and water. Edema Control - Lymphedema / SCD / Other Elevate legs to the level of the heart or above for 30 minutes daily and/or when sitting, a frequency of: - throughout the day Avoid standing for long periods of time. Exercise regularly Compression stocking or Garment 20-30 mm/Hg pressure to: - compression stocking to right leg daily Additional Orders / Instructions Follow Nutritious Diet Wound Treatment Wound #3 - Lower Leg Wound Laterality: Right, Medial, Distal Cleanser: Soap and Water Every Other Day/15 Days Discharge Instructions: May shower and wash wound with dial antibacterial soap and water prior to dressing change. Cleanser: Wound Cleanser (Generic) Every Other Day/15 Days Discharge Instructions: Cleanse the wound with wound cleanser prior to applying a clean dressing using gauze sponges, not tissue or cotton balls. Peri-Wound Care: Sween Lotion (Moisturizing lotion) Every Other Day/15 Days Discharge Instructions: Apply moisturizing lotion as directed Prim Dressing: KerraCel Ag Gelling Fiber Dressing, 2x2 in (silver alginate) Every Other Day/15 Days ary Discharge Instructions: lightly pack into undermining and wound bed , change every other day after wrap removed Secondary Dressing: Zetuvit Plus Silicone Border Dressing 4x4 (in/in) Every Other Day/15 Days Discharge Instructions: Apply silicone border over primary dressing as directed. Consults Infectious Disease - Dr. Juleen China - osteomyelitis of right great toe Electronic Signature(s) Signed: 03/13/2021 9:19:00 AM By: Wyatt Shan DO Entered By: [name] on 03/13/2021 09:11:43 Prescription 03/13/2021 -------------------------------------------------------------------------------- Luecke, Coron L. Wyatt Shan DO Patient Name: Provider: Nov 11, 1950  [id number] Date of Birth: NPI#: Jerilynn Mages [id number] Sex: DEA #: [phone] Phone #: License #: [place] Patient Address: [address], [place] [address] [place],  [address] [phone] Allergies penicillin Provider's Orders Infectious Disease - Dr. Juleen China - osteomyelitis of right great toe Hand Signature: Date(s): Electronic Signature(s) Signed: 03/13/2021 9:19:00 AM By: Wyatt Shan DO Entered By: [name] on 03/13/2021 09:11:44 -------------------------------------------------------------------------------- Problem List Details Patient Name: Date of Service: CLABORN, KROLCZYK 03/13/2021 8:15 A M Medical Record Number: [id number] Patient Account Number: [id number] Date of Birth/Sex: Treating RN: Dec 10, 1950 (70 y.o. 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 12/07/2020 No Yes I73.9 Peripheral vascular disease, unspecified 12/07/2020 No Yes Inactive Problems ICD-10 Code Description Active Date Inactive Date T81.30XA Disruption of wound, unspecified, initial encounter 12/07/2020 12/07/2020 Resolved Problems Electronic Signature(s) Signed: 03/13/2021 9:19:00 AM By: Wyatt Shan DO Entered By: [name] on 03/13/2021 09:09:33 -------------------------------------------------------------------------------- Progress Note Details Patient Name: Date of Service: [name] 03/13/2021 8:15 A  M Medical Record Number: [id number] Patient Account Number: [id number] Date of Birth/Sex: Treating RN: 30-Apr-1951 (70 y.o. [name] Primary Care Provider: Sueanne Jackson Other Clinician: Referring Provider: Treating Provider/Extender: [name] in Treatment: 13 Subjective Chief Complaint Information obtained from Patient Surgical wound dehiscence of right lower extremity and Great toe wound. History of Present Illness (HPI) Admission 6/2 Mr. Tarance Penry is a 70 year old male with a past medical history of  peripheral arterial disease status post right common femoral to peroneal bypass on 10/13/2020 and coil embolization of bypass fistula x2 on 11/01/2020. He presents today because he has wound dehiscence that occurred 3 weeks ago from his recent vascular surgery. He has been using wet-to-dry dressings to this area. He also has necrotic tissue to the right great toe. This was the original symptom that precipitated further vascular evaluation that led to his aortogram and ultimately bypass graft. He is currently keeping this area covered. He currently denies signs of infection. 6/6; patient presents for 1 week follow-up. He had Kerlix/Coban compression for the past week with Hydrofera Blue underneath. He reports tolerating this well without any issues. He thinks the wound looks smaller today. He has been using Betadine to the toe wound. He has no complaints today. He denies signs of infection. 6/20; patient presents for 2-week follow-up. He had Kerlix/Coban compression with Hydrofera Blue. He has tolerated this well. He reports significant improvement in leg swelling. He also thinks the wound looks smaller. He has no complaints today and denies signs of infection. 6/27; patient presents for 1 week follow-up. He continues to use Hydrofera Blue under Kerlix/Coban compression and has tolerated this well. He continues to report improvement in leg swelling. He is  content with the wound care progress at this time. He denies signs of infection. 7/5; patient presents for 1 week follow-up. He has been using Hydrofera Blue and Santyl under Kerlix/Coban. He reports tolerating this well. He has some mild tenderness to the distal portion of the right leg wound. He denies signs of infection. 7/11; patient presents for 1 week follow-up. He has been using Hydrofera Blue and Santyl under Kerlix/Coban. He has no issues or complaints today. He was started on antibiotics at last clinic visit and reports improvement in tenderness and erythema to the wound. 7/21; patient presents for 1 week follow-up. He has been using Hydrofera Blue and Santyl, silver alginate under Kerlix/Coban. He has no issues or complaints today. He reports improvement T his wound healing. Unfortunately he hit his right great toe and split the callus open a little bit. He denies signs of infection. o 7/29; patient presents for 1 week follow-up. He has tolerated the compression wrap well and has no issues or complaints today. He is going to be out of town next week and will not return until next weekend. He denies signs of infection. 02/14/2021 upon evaluation today patient appears to be doing really about the same in regard to his toe ulcer. Currently he did have repeat studies and evaluation with the vascular surgeon. The study showed that he has an ABI on the right 1.03 with a TBI of 0.68 and on the left and ABI 0.98 with a TBI of 0.92. Overall this seems to be doing excellent which is great news. With that being said it was noted that the patient does have some need for coil embolization of a persistent fistula in his bypass but that something that is being delayed for now as he is doing quite well. With regard to the dehiscence area there is actually signs of this healing and improving in regard to the leg. In regard to the toe this actually appears to be bone that is actually exposed in the distal  portion of the toe. He has not had any x-rays up to this point that I can see. I inquired and he told me he has not gone for x-ray at this point. I think that something more  than I need to do although I am going to try to see what I can do about debriding away the necrotic bone that appears to be present in the distal portion of the toe. 8/16; patient presents for 1 week follow-up. Patient states he has been on his feet more in the past week. He had a 7-hour drive yesterday. Overall he is doing well with the compression wrap to the right or left lower extremity. He is keeping the toe covered on the right foot. He has been unable to obtain the x-ray ordered at last clinic visit. He currently denies signs of infection. 8/22; patient presents for 1 week follow-up. He has no issues or concerns today. He states he is going to obtain his right foot x-ray this week. He currently denies signs of infection. 9/6; patient presents for 1 week follow-up. He reports no issues or concerns today. He obtained his right foot x-ray. He currently denies signs of infection and overall feels well. He has been trying to exercise more. Patient History Information obtained from Patient. Family History Unknown History. Social History Never smoker, Marital Status - Married, Alcohol Use - Moderate, Drug Use - No History, Caffeine Use - Daily. Medical History Cardiovascular Patient has history of Coronary Artery Disease, Peripheral Arterial Disease, Peripheral Venous Disease Objective Constitutional respirations regular, non-labored and within target range for patient.. Vitals Time Taken: 8:52 AM, Height: 72 in, Source: Stated, Weight: 162 lbs, Source: Stated, BMI: 22, Temperature: 97.6 F, Pulse: 80 bpm, Respiratory Rate: 18 breaths/min, Blood Pressure: 147/80 mmHg. Cardiovascular 2+ dorsalis pedis/posterior tibialis pulses. Psychiatric pleasant and cooperative. General Notes: Right lower extremity: Small open  wound with granulation tissue to the lower aspect of the leg. Good edema control. No signs of infection. To the right great toe there is epithelialization to the distal aspect with no drainage noted. Integumentary (Hair, Skin) Wound #2 status is Healed - Epithelialized. Original cause of wound was Gradually Appeared. The date acquired was: 09/18/2020. The wound has been in treatment 13 weeks. The wound is located on the Right T Great. The wound measures 0cm length x 0cm width x 0cm depth; 0cm^2 area and 0cm^3 volume. oe There is no tunneling or undermining noted. There is a none present amount of drainage noted. There is no granulation within the wound bed. There is no necrotic tissue within the wound bed. Wound #3 status is Open. Original cause of wound was Gradually Appeared. The date acquired was: 12/11/2020. The wound has been in treatment 13 weeks. The wound is located on the Right,Distal,Medial Lower Leg. The wound measures 0.1cm length x 0.1cm width x 0.1cm depth; 0.008cm^2 area and 0.001cm^3 volume. There is no tunneling or undermining noted. There is a none present amount of drainage noted. The wound margin is indistinct and nonvisible. There is no granulation within the wound bed. There is no necrotic tissue within the wound bed. 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 T the right lower extremity wound there is epithelialization within the divot. I recommended using a compression stocking and o see how he does over the next week. The right great toe wound is healed as well. He did obtain his x-ray and results show erosion of the distal first phalanx consistent with osteomyelitis. We discussed results and  referral to  infectious disease. Overall patient has done well over the past few weeks with this wound. He has closed up however he has a risk of opening up again and not healing. We discussed that if he has any changes such as increased swelling, pain redness or increased warmth he would need to call our clinic or go to the ED. As of now the toe is not red or swollen. The toe has remained stable over the past month. Patient will be out of town next week and so I recommended follow-up in 2 weeks. He knows to call our clinic with any questions or concerns. Plan Follow-up Appointments: Return Appointment in 2 weeks. - Dr. Heber [place] Bathing/ Shower/ Hygiene: May shower and wash wound with soap and water. Edema Control - Lymphedema / SCD / Other: Elevate legs to the level of the heart or above for 30 minutes daily and/or when sitting, a frequency of: - throughout the day Avoid standing for long periods of time. Exercise regularly Compression stocking or Garment 20-30 mm/Hg pressure to: - compression stocking to right leg daily Additional Orders / Instructions: Follow Nutritious Diet Consults ordered were: Infectious Disease - Dr. Juleen China - osteomyelitis of right great toe WOUND #3: - Lower Leg Wound Laterality: Right, Medial, Distal Cleanser: Soap and Water Every Other Day/15 Days Discharge Instructions: May shower and wash wound with dial antibacterial soap and water prior to dressing change. Cleanser: Wound Cleanser (Generic) Every Other Day/15 Days Discharge Instructions: Cleanse the wound with wound cleanser prior to applying a clean dressing using gauze sponges, not tissue or cotton balls. Peri-Wound Care: Sween Lotion (Moisturizing lotion) Every Other Day/15 Days Discharge Instructions: Apply moisturizing lotion as directed Prim Dressing: KerraCel Ag Gelling Fiber Dressing, 2x2 in (silver alginate) Every Other Day/15 Days ary Discharge Instructions: lightly pack into undermining and wound  bed , change every other day after wrap removed Secondary Dressing: Zetuvit Plus Silicone Border Dressing 4x4 (in/in) Every Other Day/15 Days Discharge Instructions: Apply silicone border over primary dressing as directed. 1. Compression stocking to the right lower extremity 2. Infectious disease referral 3. Follow-up in 2 weeks Electronic Signature(s) Signed: 03/13/2021 9:19:00 AM By: Wyatt Shan DO Entered By: [name] on 03/13/2021 09:17:26 -------------------------------------------------------------------------------- HxROS Details Patient Name: Date of Service: [name]. 03/13/2021 8:15 A M Medical Record Number: [id number] Patient Account Number: [id number] Date of Birth/Sex: Treating RN: September 14, 1950 (70 y.o. [name] Primary Care Provider: Sueanne Jackson Other Clinician: Referring Provider: Treating Provider/Extender: [name] in Treatment: 13 Information Obtained From Patient Cardiovascular Medical History: Positive for: Coronary Artery Disease; Peripheral Arterial Disease; Peripheral Venous Disease Immunizations Pneumococcal Vaccine: Received Pneumococcal Vaccination: Yes Received Pneumococcal Vaccination On or After 60th Birthday: No Implantable Devices None Family and Social History Unknown History: Yes; Never smoker; Marital Status - Married; Alcohol Use: Moderate; Drug Use: No History; Caffeine Use: Daily; Financial Concerns: No; Food, Clothing or Shelter Needs: No; Support System Lacking: No; Transportation Concerns: No Electronic Signature(s) Signed: 03/13/2021 9:19:00 AM By: Wyatt Shan DO Signed: 03/19/2021 4:41:05 PM By: [name] Entered By: [name] on 03/13/2021 09:10:41 -------------------------------------------------------------------------------- SuperBill Details Patient Name: Date of Service: [name] 03/13/2021 Medical Record Number: [id number] Patient Account Number:  [id number] Date of Birth/Sex: Treating RN: Apr 27, 1951 (70 y.o. 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 I73.9 Peripheral vascular disease, unspecified M86.671 Other chronic osteomyelitis, right ankle and foot Facility Procedures CPT4 Code: [id number] Description: 99213 - WOUND CARE VISIT-LEV 3 EST PT Modifier: Quantity: 1 Physician Procedures : CPT4 Code Description Modifier [id number] - WC PHYS LEVEL 3 - EST PT ICD-10 Diagnosis Description 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

## 2021-03-20 ENCOUNTER — Telehealth: Payer: Self-pay

## 2021-03-20 NOTE — Telephone Encounter (Signed)
   [facility] Pre-operative Risk Assessment    Patient Name: [name]  DOB: 06/07/1951 MRN: [id number]  HEARTCARE STAFF:  - IMPORTANT!!!!!! Under Visit Info/Reason for Call, type in Other and utilize the format Clearance MM/DD/YY or Clearance TBD. Do not use dashes or single digits. - Please review there is not already an duplicate clearance open for this procedure. - If request is for dental extraction, please clarify the # of teeth to be extracted. - If the patient is currently at the dentist's office, call Pre-Op Callback Staff (MA/nurse) to input urgent request.  - If the patient is not currently in the dentist office, please route to the Pre-Op pool.  Request for surgical clearance:  What type of surgery is being performed? HERNIA REPAIR  When is this surgery scheduled? TBD  What type of clearance is required (medical clearance vs. Pharmacy clearance to hold med vs. Both)? BOTH  Are there any medications that need to be held prior to surgery and how long? [facility]   Practice name and name of physician performing surgery? [place]  What is the office phone number? [phone]   7.   What is the office fax number? [phone]  8.   Anesthesia type (None, local, MAC, general) ? GENERAL   Waylan Rocher 03/20/2021, 3:41 PM

## 2021-03-20 NOTE — Telephone Encounter (Signed)
Xarelto managed by [facility]  Pt will call us back.

## 2021-03-22 ENCOUNTER — Telehealth: Payer: Self-pay

## 2021-03-22 ENCOUNTER — Other Ambulatory Visit (HOSPITAL_COMMUNITY): Payer: Self-pay

## 2021-03-22 NOTE — Telephone Encounter (Signed)
[facility] Patient Teacher, English as a foreign language completed.    The patient is insured through [employer].  [employer] will need a PA   We will continue to follow to see if copay assistance is needed.  [name], [facility] Patient [name] Phone: [phone] Fax:  [phone]

## 2021-03-23 ENCOUNTER — Ambulatory Visit (INDEPENDENT_AMBULATORY_CARE_PROVIDER_SITE_OTHER): Payer: PPO | Admitting: Internal Medicine

## 2021-03-23 ENCOUNTER — Other Ambulatory Visit: Payer: Self-pay

## 2021-03-23 ENCOUNTER — Encounter: Payer: Self-pay | Admitting: Internal Medicine

## 2021-03-23 VITALS — BP 146/88 | HR 92 | Temp 98.1°F | Wt 164.0 lb

## 2021-03-23 DIAGNOSIS — I739 Peripheral vascular disease, unspecified: Secondary | ICD-10-CM | POA: Diagnosis not present

## 2021-03-23 DIAGNOSIS — M86671 Other chronic osteomyelitis, right ankle and foot: Secondary | ICD-10-CM | POA: Diagnosis not present

## 2021-03-23 MED ORDER — DOXYCYCLINE HYCLATE 100 MG PO TABS: 100.0000 mg | ORAL_TABLET | Freq: Two times a day (BID) | ORAL | 0 refills | Status: AC

## 2021-03-23 MED ORDER — AMOXICILLIN-POT CLAVULANATE 875-125 MG PO TABS: 1.0000 | ORAL_TABLET | Freq: Two times a day (BID) | ORAL | 0 refills | Status: AC

## 2021-03-23 NOTE — Patient Instructions (Signed)
Thank you for coming to see me today. It was a pleasure seeing you.  To Do: Labs today Continue wound care Start doxycycline and augmentin.   Follow up in 4 weeks  If you have any questions or concerns, please do not hesitate to call the office at [phone].  Take Care,   [name]

## 2021-03-23 NOTE — Progress Notes (Signed)
[facility]  Reason for Consult: Right foot osteomyelitis  Referring Provider: Dr [facility]   HPI:    [name] is a 70 y.o. male with PMHx as below who presents to the clinic for right great toe OM.   Patient has hx of PAD s/p right common femoral to peroneal bypass in April 2022 in the setting of a necrotic great right toe.  He has received antibiotics previously for this with most recent course being in July.  It was felt that this ulcer previously went down to the bone and x rays done on 03/08/21 showed interval tuftal erosion of the distal 1st phalanx c/w OM.  He presents today for further evaluation and reports that the ulcer is doing well. It is not painful and not draining.  He has not noticed any worsening.  No fevers, chills or cellulitis.  He has been using compression socks.   Patient's Medications  New Prescriptions   No medications on file  Previous Medications   ASPIRIN EC 81 MG TABLET    Take 81 mg by mouth daily. Swallow whole.   BIOTIN PO    Take 1 tablet by mouth daily.   CALCIUM PO    Take 1 tablet by mouth daily.   CILOSTAZOL (PLETAL) 50 MG TABLET    Take 1 tablet (50 mg total) by mouth 2 (two) times daily.   EZETIMIBE (ZETIA) 10 MG TABLET    TAKE 1 TABLET BY MOUTH DAILY.   OXYCODONE-ACETAMINOPHEN (PERCOCET/ROXICET) 5-325 MG TABLET    Take 1 tablet by mouth every 4 (four) hours as needed for moderate pain.   ROSUVASTATIN (CRESTOR) 40 MG TABLET    TAKE 1 TABLET BY MOUTH EVERY EVENING.   XARELTO 2.5 MG TABS TABLET    TAKE 1 TABLET BY MOUTH 2 TIMES DAILY.  Modified Medications   No medications on file  Discontinued Medications   No medications on file      Past Medical History:  Diagnosis Date   Hyperlipidemia    Hyperlipidemia    PAD (peripheral artery disease) (HCC)     Social History   Tobacco Use   Smoking status: Never   Smokeless tobacco: Never  Vaping Use   Vaping Use: Never used  Substance Use Topics    Alcohol use: Yes    Alcohol/week: 4.0 standard drinks    Types: 2 Cans of beer, 2 Shots of liquor per week   Drug use: Never    Family History  Adopted: Yes    Allergies  Allergen Reactions   Penicillins     Can't recall, childhood allergy    Review of Systems  Constitutional:  Negative for chills and fever.  Respiratory: Negative.    Cardiovascular: Negative.   Musculoskeletal: Negative.      OBJECTIVE:    Vitals:   03/23/21 1340  BP: (!) 146/88  Pulse: 92  Temp: 98.1 F (36.7 C)  TempSrc: Oral  SpO2: 97%  Weight: 164 lb (74.4 kg)     Body mass index is 22.24 kg/m.  Physical Exam Constitutional:      General: He is not in acute distress.    Appearance: Normal appearance.  HENT:     Head: Normocephalic and atraumatic.  Pulmonary:     Effort: Pulmonary effort is normal. No respiratory distress.  Musculoskeletal:     Comments: + ulcer  Skin:    General: Skin is warm and dry.  Neurological:  General: No focal deficit present.     Mental Status: He is alert and oriented to person, place, and time.      Labs and Microbiology:  CBC Latest Ref Rng & Units 11/09/2020 11/01/2020 10/16/2020  WBC 4.0 - 10.5 K/uL 7.6 - 12.6(H)  Hemoglobin 13.0 - 17.0 g/dL 12.7(L) 13.3 10.8(L)  Hematocrit 39.0 - 52.0 % 39.4 39.0 32.1(L)  Platelets 150 - 400 K/uL 253 - 197   CMP Latest Ref Rng & Units 01/02/2021 11/09/2020 11/01/2020  Glucose 70 - 99 mg/dL - 86 100(H)  BUN 8 - 23 mg/dL - 19 22  Creatinine 0.61 - 1.24 mg/dL - 0.86 0.80  Sodium 135 - 145 mmol/L - 140 139  Potassium 3.5 - 5.1 mmol/L - 4.1 5.4(H)  Chloride 98 - 111 mmol/L - 103 102  CO2 22 - 32 mmol/L - 28 -  Calcium 8.9 - 10.3 mg/dL - 8.6(L) -  Total Protein 6.0 - 8.5 g/dL 6.7 6.5 -  Total Bilirubin 0.0 - 1.2 mg/dL 0.7 0.4 -  Alkaline Phos 44 - 121 IU/L 114 102 -  AST 0 - 40 IU/L 37 29 -  ALT 0 - 44 IU/L 52(H) 55(H) -     Imaging: IMPRESSION: Interval tuftal erosion of the distal 1st phalanx consistent  with osteomyelitis in the absence of interval surgery. Generalized soft tissue swelling without evidence of foreign body.   ASSESSMENT & PLAN:    1. PAD (peripheral artery disease) ([place]) 2. Chronic osteomyelitis of right foot ([place])  He has right great toe chronic OM complicating an ischemic ulcer in setting of PAD.  Discussed treatment include antibiotic therapy combined with pressure off loading, glycemic control, wound care, and adequate peripheral blood flow.  With this multifactorial approach, cure can often be challenging without further surgery or debridement to remove osteomyelitic bone and may ultimately require amputation.  He has noted a lot of improvement since his vascular surgery to improve blood flow and foot appears well perfused today.    Discussed PO vs IV antibiotics and the emerging data that suggests equivalency for this type infection.  We opted for PO therapy today with Doxy and Augmentin since there is no cultures to guide therapy.  He has a possible rash to PCN as a child and discussed trial of Augmentin with return precautions discussed.  Will check CBC, BMP, and ESR/CRP today at baseline.  RTC 4 weeks and plan for 6 weeks of antibiotics to treat this infection.    [facility] 03/23/2021, 1:45 PM

## 2021-03-24 LAB — CBC
HCT: 45.1 % (ref 38.5–50.0)
Hemoglobin: 14.9 g/dL (ref 13.2–17.1)
MCH: 31 pg (ref 27.0–33.0)
MCHC: 33 g/dL (ref 32.0–36.0)
MCV: 94 fL (ref 80.0–100.0)
MPV: 12 fL (ref 7.5–12.5)
Platelets: 227 10*3/uL (ref 140–400)
RBC: 4.8 10*6/uL (ref 4.20–5.80)
RDW: 12.5 % (ref 11.0–15.0)
WBC: 7.3 10*3/uL (ref 3.8–10.8)

## 2021-03-24 LAB — BASIC METABOLIC PANEL
BUN: 18 mg/dL (ref 7–25)
CO2: 33 mmol/L — ABNORMAL HIGH (ref 20–32)
Calcium: 9 mg/dL (ref 8.6–10.3)
Chloride: 105 mmol/L (ref 98–110)
Creat: 1.02 mg/dL (ref 0.70–1.28)
Glucose, Bld: 94 mg/dL (ref 65–99)
Potassium: 4.3 mmol/L (ref 3.5–5.3)
Sodium: 141 mmol/L (ref 135–146)

## 2021-03-24 LAB — C-REACTIVE PROTEIN: CRP: 0.5 mg/L (ref <8.0)

## 2021-03-24 LAB — SEDIMENTATION RATE: Sed Rate: 2 mm/h (ref 0–20)

## 2021-03-26 ENCOUNTER — Other Ambulatory Visit: Payer: Self-pay

## 2021-03-26 ENCOUNTER — Telehealth: Payer: Self-pay

## 2021-03-26 ENCOUNTER — Encounter (HOSPITAL_BASED_OUTPATIENT_CLINIC_OR_DEPARTMENT_OTHER): Payer: PPO | Admitting: Internal Medicine

## 2021-03-26 DIAGNOSIS — T8130XD Disruption of wound, unspecified, subsequent encounter: Secondary | ICD-10-CM | POA: Diagnosis not present

## 2021-03-26 DIAGNOSIS — I739 Peripheral vascular disease, unspecified: Secondary | ICD-10-CM

## 2021-03-26 DIAGNOSIS — L97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 with unspecified severity: Secondary | ICD-10-CM

## 2021-03-26 DIAGNOSIS — L97519 Non-pressure chronic ulcer of other part of right foot with unspecified severity: Secondary | ICD-10-CM

## 2021-03-26 NOTE — Telephone Encounter (Signed)
Attempted to call regarding labs. No vm.

## 2021-03-26 NOTE — Telephone Encounter (Signed)
-----   Message from Mignon Pine, DO sent at 03/26/2021  3:15 PM EDT ----- Please let patient know that baseline labs are reassuring and to continue antibiotics and follow up as planned.  Thanks .

## 2021-03-27 NOTE — Progress Notes (Signed)
[name], [name] ([id number]) Visit Report for 03/26/2021 Chief Complaint Document Details Patient Name: Date of Service: [name], [name] 03/26/2021 8:00 A M Medical Record Number: [id number] Patient Account Number: [id number] Date of Birth/Sex: Treating RN: [birth year] (70 y.o. [name] Primary Care Provider: [name] Other Clinician: Referring Provider: Treating Provider/Extender: Bartholome Bill in Treatment: 15 Information Obtained from: Patient Chief Complaint Surgical wound dehiscence of right lower extremity and Great toe wound. Electronic Signature(s) Signed: 03/26/2021 10:19:30 AM By: Kalman Shan DO Entered By: Kalman Shan on 03/26/2021 10:14:04 -------------------------------------------------------------------------------- HPI Details Patient Name: Date of Service: Wyatt Loth. 03/26/2021 8:00 A M Medical Record Number: [id number] Patient Account Number: [id number] Date of Birth/Sex: Treating RN: 1951/06/17 (70 y.o. [name] Primary Care Provider: [name] Other Clinician: Referring Provider: Treating Provider/Extender: Bartholome Bill in Treatment: 15 History of Present Illness HPI Description: Admission 6/2 [name] is a 70 year old male with a past medical history of peripheral arterial disease status post right common femoral to peroneal bypass on 10/13/2020 and coil embolization of bypass fistula x2 on 11/01/2020. He presents today because he has wound dehiscence that occurred 3 weeks ago from his recent vascular surgery. He has been using wet-to-dry dressings to this area. He also has necrotic tissue to the right great toe. This was the original symptom that precipitated further vascular evaluation that led to his aortogram and ultimately bypass graft. He is currently keeping this area covered. He currently denies signs of infection. 6/6; patient presents for 1 week follow-up. He  had Kerlix/Coban compression for the past week with Hydrofera Blue underneath. He reports tolerating this well without any issues. He thinks the wound looks smaller today. He has been using Betadine to the toe wound. He has no complaints today. He denies signs of infection. 6/20; patient presents for 2-week follow-up. He had Kerlix/Coban compression with Hydrofera Blue. He has tolerated this well. He reports significant improvement in leg swelling. He also thinks the wound looks smaller. He has no complaints today and denies signs of infection. 6/27; patient presents for 1 week follow-up. He continues to use Hydrofera Blue under Kerlix/Coban compression and has tolerated this well. He continues to report improvement in leg swelling. He is content with the wound care progress at this time. He denies signs of infection. 7/5; patient presents for 1 week follow-up. He has been using Hydrofera Blue and Santyl under Kerlix/Coban. He reports tolerating this well. He has some mild tenderness to the distal portion of the right leg wound. He denies signs of infection. 7/11; patient presents for 1 week follow-up. He has been using Hydrofera Blue and Santyl under Kerlix/Coban. He has no issues or complaints today. He was started on antibiotics at last clinic visit and reports improvement in tenderness and erythema to the wound. 7/21; patient presents for 1 week follow-up. He has been using Hydrofera Blue and Santyl, silver alginate under Kerlix/Coban. He has no issues or complaints today. He reports improvement T his wound healing. Unfortunately he hit his right great toe and split the callus open a little bit. He denies signs of infection. o 7/29; patient presents for 1 week follow-up. He has tolerated the compression wrap well and has no issues or complaints today. He is going to be out of town next week and will not return until next weekend. He denies signs of infection. 02/14/2021 upon evaluation today  patient appears to be doing really about the same in  regard to his toe ulcer. Currently he did have repeat studies and evaluation with the vascular surgeon. The study showed that he has an ABI on the right 1.03 with a TBI of 0.68 and on the left and ABI 0.98 with a TBI of 0.92. Overall this seems to be doing excellent which is great news. With that being said it was noted that the patient does have some need for coil embolization of a persistent fistula in his bypass but that something that is being delayed for now as he is doing quite well. With regard to the dehiscence area there is actually signs of this healing and improving in regard to the leg. In regard to the toe this actually appears to be bone that is actually exposed in the distal portion of the toe. He has not had any x-rays up to this point that I can see. I inquired and he told me he has not gone for x-ray at this point. I think that something more than I need to do although I am going to try to see what I can do about debriding away the necrotic bone that appears to be present in the distal portion of the toe. 8/16; patient presents for 1 week follow-up. Patient states he has been on his feet more in the past week. He had a 7-hour drive yesterday. Overall he is doing well with the compression wrap to the right or left lower extremity. He is keeping the toe covered on the right foot. He has been unable to obtain the x-ray ordered at last clinic visit. He currently denies signs of infection. 8/22; patient presents for 1 week follow-up. He has no issues or concerns today. He states he is going to obtain his right foot x-ray this week. He currently denies signs of infection. 9/6; patient presents for 1 week follow-up. He reports no issues or concerns today. He obtained his right foot x-ray. He currently denies signs of infection and overall feels well. He has been trying to exercise more. 9/19; patient presents for follow-up. He has no  issues or concerns today. He states he saw infectious disease and is currently on doxycycline and Augmentin. He denies signs of infection. He has been using his compression stocking to his right lower extremity to help with swelling. Electronic Signature(s) Signed: 03/26/2021 10:19:30 AM By: Kalman Shan DO Entered By: Kalman Shan on 03/26/2021 10:14:45 -------------------------------------------------------------------------------- Physical Exam Details Patient Name: Date of Service: [name], [name] 03/26/2021 8:00 A M Medical Record Number: [id number] Patient Account Number: [id number] Date of Birth/Sex: Treating RN: 05-20-1951 (70 y.o. [name] Primary Care Provider: [name] Other Clinician: Referring Provider: Treating Provider/Extender: Bartholome Bill in Treatment: 15 Constitutional respirations regular, non-labored and within target range for patient.. Cardiovascular 2+ dorsalis pedis/posterior tibialis pulses. Psychiatric pleasant and cooperative. Notes Right lower extremity: Epithelialization to previous wound site. Good edema control. No signs of skin breakdown or infection. Right great toe: There is a scab over previous wound site. No signs of infection. Electronic Signature(s) Signed: 03/26/2021 10:19:30 AM By: Kalman Shan DO Entered By: Kalman Shan on 03/26/2021 10:16:18 -------------------------------------------------------------------------------- Physician Orders Details Patient Name: Date of Service: Wyatt Loth. 03/26/2021 8:00 A M Medical Record Number: [id number] Patient Account Number: [id number] Date of Birth/Sex: Treating RN: June 18, 1951 (70 y.o. 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 03/26/2021 8:00 A M Medical Record Number: [id number] Patient Account Number: [id number] Date of Birth/Sex: Treating RN: 04/30/1951 (70 y.o. 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 03/26/2021 8:00 A M Medical Record Number: [id number] Patient  Account Number: [id number] Date of Birth/Sex: Treating RN: September 07, 1950 (70 y.o. [name] Primary Care Provider: [name] Other Clinician: Referring Provider: Treating Provider/Extender: Bartholome Bill in Treatment: 15 Subjective Chief Complaint Information obtained from Patient Surgical wound dehiscence of right lower extremity and Great toe wound. History of Present Illness (HPI) Admission 6/2 [name] is a 70 year old male with a past medical history of peripheral arterial disease status post right common femoral to peroneal bypass on 10/13/2020 and coil embolization of bypass fistula x2 on 11/01/2020. He presents today because he has wound dehiscence that occurred 3 weeks ago from his recent vascular surgery. He has been using wet-to-dry dressings to this area. He also has necrotic tissue to the right great toe. This was the original symptom that precipitated further vascular evaluation that led to his aortogram and ultimately bypass graft. He is currently keeping this area covered. He currently denies signs of infection. 6/6; patient presents for 1 week follow-up. He had Kerlix/Coban compression for the past week with Hydrofera Blue underneath. He reports tolerating this well without any issues. He thinks the wound looks smaller today. He has been using Betadine to the toe wound. He has no complaints today. He denies signs of infection. 6/20; patient presents for 2-week follow-up. He had Kerlix/Coban compression with Hydrofera Blue. He has tolerated this well. He reports significant improvement in leg swelling. He also thinks the wound looks smaller. He has no complaints today and denies signs of infection. 6/27; patient presents for 1 week follow-up. He continues to use Hydrofera Blue under Kerlix/Coban compression and has tolerated this well. He continues to report improvement in leg swelling. He is content with the wound care progress at this  time. He denies signs of infection. 7/5; patient presents for 1 week follow-up. He has been using Hydrofera Blue and Santyl under Kerlix/Coban. He reports tolerating this well. He has some mild tenderness to the distal portion of the right leg wound. He denies signs of infection. 7/11; patient presents for 1 week follow-up. He has been  using Hydrofera Blue and Santyl under Kerlix/Coban. He has no issues or complaints today. He was started on antibiotics at last clinic visit and reports improvement in tenderness and erythema to the wound. 7/21; patient presents for 1 week follow-up. He has been using Hydrofera Blue and Santyl, silver alginate under Kerlix/Coban. He has no issues or complaints today. He reports improvement T his wound healing. Unfortunately he hit his right great toe and split the callus open a little bit. He denies signs of infection. o 7/29; patient presents for 1 week follow-up. He has tolerated the compression wrap well and has no issues or complaints today. He is going to be out of town next week and will not return until next weekend. He denies signs of infection. 02/14/2021 upon evaluation today patient appears to be doing really about the same in regard to his toe ulcer. Currently he did have repeat studies and evaluation with the vascular surgeon. The study showed that he has an ABI on the right 1.03 with a TBI of 0.68 and on the left and ABI 0.98 with a TBI of 0.92. Overall this seems to be doing excellent which is great news. With that being said it was noted that the patient does have some need for coil embolization of a persistent fistula in his bypass but that something that is being delayed for now as he is doing quite well. With regard to the dehiscence area there is actually signs of this healing and improving in regard to the leg. In regard to the toe this actually appears to be bone that is actually exposed in the distal portion of the toe. He has not had any x-rays up  to this point that I can see. I inquired and he told me he has not gone for x-ray at this point. I think that something more than I need to do although I am going to try to see what I can do about debriding away the necrotic bone that appears to be present in the distal portion of the toe. 8/16; patient presents for 1 week follow-up. Patient states he has been on his feet more in the past week. He had a 7-hour drive yesterday. Overall he is doing well with the compression wrap to the right or left lower extremity. He is keeping the toe covered on the right foot. He has been unable to obtain the x-ray ordered at last clinic visit. He currently denies signs of infection. 8/22; patient presents for 1 week follow-up. He has no issues or concerns today. He states he is going to obtain his right foot x-ray this week. He currently denies signs of infection. 9/6; patient presents for 1 week follow-up. He reports no issues or concerns today. He obtained his right foot x-ray. He currently denies signs of infection and overall feels well. He has been trying to exercise more. 9/19; patient presents for follow-up. He has no issues or concerns today. He states he saw infectious disease and is currently on doxycycline and Augmentin. He denies signs of infection. He has been using his compression stocking to his right lower extremity to help with swelling. Patient History Information obtained from Patient. Family History Unknown History. Social History Never smoker, Marital Status - Married, Alcohol Use - Moderate, Drug Use - No History, Caffeine Use - Daily. Medical History Cardiovascular Patient has history of Coronary Artery Disease, Peripheral Arterial Disease, Peripheral Venous Disease Objective Constitutional respirations regular, non-labored and within target range for patient.. Vitals Time Taken:  8:19 AM, Height: 72 in, Weight: 162 lbs, BMI: 22, Temperature: 98.1 F, Pulse: 89 bpm, Respiratory Rate:  16 breaths/min, Blood Pressure: 112/67 mmHg. Cardiovascular 2+ dorsalis pedis/posterior tibialis pulses. Psychiatric pleasant and cooperative. General Notes: Right lower extremity: Epithelialization to previous wound site. Good edema control. No signs of skin breakdown or infection. Right great toe: There is a scab over previous wound site. No signs of infection. Integumentary (Hair, Skin) Wound #3 status is Healed - Epithelialized. Original cause of wound was Gradually Appeared. The date acquired was: 12/11/2020. The wound has been in treatment 15 weeks. The wound is located on the Right,Distal,Medial Lower Leg. The wound measures 0cm length x 0cm width x 0cm depth; 0cm^2 area and 0cm^3 volume. There is Fat Layer (Subcutaneous Tissue) exposed. There is no tunneling or undermining noted. There is a none present amount of drainage noted. The wound margin is distinct with the outline attached to the wound base. There is large (67-100%) red granulation within the wound bed. There is a small (1-33%) amount of necrotic tissue within the wound bed including Eschar. 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 This is closed today. I recommended using his compression stocking daily. His right toe wound is stable and it is scabbed over with no issues over the past 2 weeks. He saw infectious disease, Dr. 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 Patient knows to follow-up as needed. Plan Discharge From [facility] Services: Discharge from [place] 1. Discharge from clinic due to closed  wound 2. Follow-up as needed Electronic Signature(s) Signed: 03/26/2021 10:19:30 AM By: Kalman Shan DO Entered By: Kalman Shan on 03/26/2021 10:18:39 -------------------------------------------------------------------------------- HxROS Details Patient Name: Date of Service: Wyatt Loth. 03/26/2021 8:00 A M Medical Record Number: [id number] Patient Account Number: [id number] Date of Birth/Sex: Treating RN: 1950-09-23 (70 y.o. [name] Primary Care Provider: [name] Other Clinician: Referring Provider: Treating Provider/Extender: Bartholome Bill in Treatment: 15 Information Obtained From Patient Cardiovascular Medical History: Positive for: Coronary Artery Disease; Peripheral Arterial Disease; Peripheral Venous Disease Immunizations Pneumococcal Vaccine: Received Pneumococcal Vaccination: Yes Received Pneumococcal Vaccination On or After 60th Birthday: No Implantable Devices None Family and Social History Unknown History: Yes; Never smoker; Marital Status - Married; Alcohol Use: Moderate; Drug Use: No History; Caffeine Use: Daily; Financial Concerns: No; Food, Clothing or Shelter Needs: No; Support System Lacking: No; Transportation Concerns: No Electronic Signature(s) Signed: 03/26/2021 10:19:30 AM By: Kalman Shan DO Signed: 03/26/2021 5:01:42 PM By: [name] Entered By: Kalman Shan on 03/26/2021 10:15:33 -------------------------------------------------------------------------------- SuperBill Details Patient Name: Date of Service: Wyatt Loth 03/26/2021 Medical Record Number: [id number] Patient Account Number: [id number] Date of Birth/Sex: Treating RN: 1950/09/01 (70 y.o. 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

## 2021-03-27 NOTE — Progress Notes (Signed)
JADEN, ABREU ([id number]) Visit Report for 03/26/2021 Arrival Information Details Patient Name: Date of Service: LAVON, BOTHWELL 03/26/2021 8:00 A M Medical Record Number: [id number] Patient Account Number: [id number] Date of Birth/Sex: Treating RN: 16-Jun-1951 (70 y.o. [name] Primary Care Cacey Willow: [name] Other Clinician: Referring Oletha Tolson: Treating Yasamin Karel/Extender: Bartholome Bill in Treatment: 15 Visit Information History Since Last Visit All ordered tests and consults were completed: Yes Patient Arrived: Ambulatory Added or deleted any medications: No Arrival Time: 08:16 Any new allergies or adverse reactions: No Transfer Assistance: None Had a fall or experienced change in No Patient Identification Verified: Yes activities of daily living that may affect Secondary Verification Process Completed: Yes risk of falls: Patient Requires Transmission-Based Precautions: No Signs or symptoms of abuse/neglect since last visito No Patient Has Alerts: Yes Hospitalized since last visit: No Patient Alerts: Patient on Blood Thinner Implantable device outside of the clinic excluding No ABI R=0.95 cellular tissue based products placed in the center ABI L=1.21 since last visit: Has Dressing in Place as Prescribed: Yes Pain Present Now: No Electronic Signature(s) Signed: 03/26/2021 5:01:42 PM By: [name] Entered By: [name] on 03/26/2021 08:19:19 -------------------------------------------------------------------------------- Clinic Level of Care Assessment Details Patient Name: Date of Service: JEREMIAH, CURCI 03/26/2021 8:00 A M Medical Record Number: [id number] Patient Account Number: [id number] Date of Birth/Sex: Treating RN: [birth year] (70 y.o. [name], Lauren Primary Care Meliana Canner: [name] Other Clinician: Referring Jarelyn Bambach: Treating Levander Katzenstein/Extender: Bartholome Bill in Treatment:  15 Clinic Level of Care Assessment Items TOOL 4 Quantity Score X- 1 0 Use when only an EandM is performed on FOLLOW-UP visit ASSESSMENTS - Nursing Assessment / Reassessment X- 1 10 Reassessment of Co-morbidities (includes updates in patient status) X- 1 5 Reassessment of Adherence to Treatment Plan ASSESSMENTS - Wound and Skin A ssessment / Reassessment X - Simple Wound Assessment / Reassessment - one wound 1 5 []  - 0 Complex Wound Assessment / Reassessment - multiple wounds []  - 0 Dermatologic / Skin Assessment (not related to wound area) ASSESSMENTS - Focused Assessment []  - 0 Circumferential Edema Measurements - multi extremities []  - 0 Nutritional Assessment / Counseling / Intervention []  - 0 Lower Extremity Assessment (monofilament, tuning fork, pulses) []  - 0 Peripheral Arterial Disease Assessment (using hand held doppler) ASSESSMENTS - Ostomy and/or Continence Assessment and Care []  - 0 Incontinence Assessment and Management []  - 0 Ostomy Care Assessment and Management (repouching, etc.) PROCESS - Coordination of Care X - Simple Patient / Family Education for ongoing care 1 15 []  - 0 Complex (extensive) Patient / Family Education for ongoing care []  - 0 Staff obtains Programmer, systems, Records, T Results / Process Orders est []  - 0 Staff telephones HHA, Nursing Homes / Clarify orders / etc []  - 0 Routine Transfer to another Facility (non-emergent condition) []  - 0 Routine Hospital Admission (non-emergent condition) []  - 0 New Admissions / Biomedical engineer / Ordering NPWT Apligraf, etc. , []  - 0 Emergency Hospital Admission (emergent condition) X- 1 10 Simple Discharge Coordination []  - 0 Complex (extensive) Discharge Coordination PROCESS - Special Needs []  - 0 Pediatric / Minor Patient Management []  - 0 Isolation Patient Management []  - 0 Hearing / Language / Visual special needs []  - 0 Assessment of Community assistance (transportation, D/C planning,  etc.) []  - 0 Additional assistance / Altered mentation []  - 0 Support Surface(s) Assessment (bed, cushion, seat, etc.) INTERVENTIONS - Wound Cleansing / Measurement X - Simple Wound  Cleansing - one wound 1 5 []  - 0 Complex Wound Cleansing - multiple wounds X- 1 5 Wound Imaging (photographs - any number of wounds) []  - 0 Wound Tracing (instead of photographs) X- 1 5 Simple Wound Measurement - one wound []  - 0 Complex Wound Measurement - multiple wounds INTERVENTIONS - Wound Dressings []  - 0 Small Wound Dressing one or multiple wounds []  - 0 Medium Wound Dressing one or multiple wounds []  - 0 Large Wound Dressing one or multiple wounds []  - 0 Application of Medications - topical []  - 0 Application of Medications - injection INTERVENTIONS - Miscellaneous []  - 0 External ear exam []  - 0 Specimen Collection (cultures, biopsies, blood, body fluids, etc.) []  - 0 Specimen(s) / Culture(s) sent or taken to Lab for analysis []  - 0 Patient Transfer (multiple staff / Civil Service fast streamer / Similar devices) []  - 0 Simple Staple / Suture removal (25 or less) []  - 0 Complex Staple / Suture removal (26 or more) []  - 0 Hypo / Hyperglycemic Management (close monitor of Blood Glucose) []  - 0 Ankle / Brachial Index (ABI) - do not check if billed separately X- 1 5 Vital Signs Has the patient been seen at the hospital within the last three years: Yes Total Score: 65 Level Of Care: New/Established - Level 2 Electronic Signature(s) Signed: 03/27/2021 6:09:57 PM By: Rhae Hammock RN Entered By: Rhae Hammock on 03/26/2021 12:48:06 -------------------------------------------------------------------------------- Encounter Discharge Information Details Patient Name: Date of Service: [name] 03/26/2021 8:00 A M Medical Record Number: [id number] Patient Account Number: [id number] Date of Birth/Sex: Treating RN: June 25, 1951 (70 y.o. [name], Lauren Primary Care Derek Huneycutt: [name] Other Clinician: Referring Makenna Macaluso: Treating Muath Hallam/Extender: Bartholome Bill in Treatment: 15 Encounter Discharge Information Items Discharge Condition: Stable Ambulatory Status: Ambulatory Discharge Destination: Home Transportation: Private Auto Accompanied By: self Schedule Follow-up Appointment: Yes Clinical Summary of Care: Patient Declined Electronic Signature(s) Signed: 03/27/2021 6:09:57 PM By: Rhae Hammock RN Entered By: Rhae Hammock on 03/26/2021 12:48:39 -------------------------------------------------------------------------------- Lower Extremity Assessment Details Patient Name: Date of Service: [name] 03/26/2021 8:00 A M Medical Record Number: [id number] Patient Account Number: [id number] Date of Birth/Sex: Treating RN: 12-24-1950 (70 y.o. [name] Primary Care Falicity Sheets: [name] Other Clinician: Referring Revan Gendron: Treating Bren Borys/Extender: Vivia Budge Weeks in Treatment: 15 Edema Assessment Assessed: Shirlyn Goltz: No] Patrice Paradise: Yes] Edema: [Left: Ye] [Right: s] Calf Left: Right: Point of Measurement: From Medial Instep 40 cm Ankle Left: Right: Point of Measurement: From Medial Instep 25 cm Vascular Assessment Pulses: Dorsalis Pedis Palpable: [Right:Yes] Electronic Signature(s) Signed: 03/26/2021 5:01:42 PM By: [name] Entered By: [name] on 03/26/2021 08:20:39 -------------------------------------------------------------------------------- Multi Wound Chart Details Patient Name: Date of Service: [name] 03/26/2021 8:00 A M Medical Record Number: [id number] Patient Account Number: [id number] Date of Birth/Sex: Treating RN: [birth year] (70 y.o. [name] Primary Care Orton Capell: [name] Other Clinician: Referring Zaliyah Meikle: Treating Harshika Mago/Extender: Bartholome Bill in Treatment: 15 Vital Signs Height(in):  72 Pulse(bpm): 45 Weight(lbs): 162 Blood Pressure(mmHg): 112/67 Body Mass Index(BMI): 22 Temperature(F): 98.1 Respiratory Rate(breaths/min): 16 Photos: [[name]:[name]] Right, Distal, Medial Lower Leg [name] [name] Wound Location: Gradually Appeared [name] [name] Wounding Event: Venous Leg Ulcer [name] [name] Primary Etiology: Coronary Artery Disease, Peripheral [name] [name] Comorbid History: Arterial Disease, Peripheral Venous Disease 12/11/2020 [name] [name] Date Acquired: 15 [name] [name] Weeks of Treatment: Healed - Epithelialized [name] [name] Wound Status: 0x0x0 [name] [name] Measurements L x W x D (  cm) 0 [name] [name] A (cm) : rea 0 [name] [name] Volume (cm) : 100.00% [name] [name] % Reduction in Area: 100.00% [name] [name] % Reduction in Volume: Full Thickness Without Exposed [name] [name] Classification: Support Structures None Present [name] [name] Exudate A mount: Distinct, outline attached [name] [name] Wound Margin: Large (67-100%) [name] [name] Granulation Amount: Red [name] [name] Granulation Quality: Small (1-33%) [name] [name] Necrotic Amount: Eschar [name] [name] Necrotic Tissue: Fat Layer (Subcutaneous Tissue): Yes [name] [name] Exposed Structures: Fascia: No Tendon: No Muscle: No Joint: No Bone: No Large (67-100%) [name] [name] Epithelialization: Treatment Notes Electronic Signature(s) Signed: 03/26/2021 10:19:30 AM By: Kalman Shan DO Signed: 03/26/2021 5:01:42 PM By: [name] Entered By: Kalman Shan on 03/26/2021 10:12:33 -------------------------------------------------------------------------------- Multi-Disciplinary Care Plan Details Patient Name: Date of Service: [name] 03/26/2021 8:00 A M Medical Record Number: [id number] Patient Account Number: [id number] Date of Birth/Sex: Treating RN: 08/29/1950 (70 y.o. [name] Primary Care Zanyia Silbaugh: [name] Other Clinician: Referring Criag Wicklund: Treating Carmeline Kowal/Extender: Bartholome Bill in Treatment: [place] reviewed  with physician Active Inactive Electronic Signature(s) Signed: 03/26/2021 5:01:42 PM By: [name] Entered By: [name] on 03/26/2021 09:01:34 -------------------------------------------------------------------------------- Pain Assessment Details Patient Name: Date of Service: TRAVEION, RUDDOCK 03/26/2021 8:00 A M Medical Record Number: [id number] Patient Account Number: [id number] Date of Birth/Sex: Treating RN: [birth year] (70 y.o. [name] Primary Care Ketina Mars: [name] Other Clinician: Referring Santhosh Gulino: Treating Adiyah Lame/Extender: Bartholome Bill in Treatment: 15 Active Problems Location of Pain Severity and Description of Pain Patient Has Paino No Site Locations Pain Management and Medication Current Pain Management: Electronic Signature(s) Signed: 03/26/2021 5:01:42 PM By: [name] Entered By: [name] on 03/26/2021 08:20:08 -------------------------------------------------------------------------------- Patient/Caregiver Education Details Patient Name: Date of Service: [name] 9/19/2022andnbsp8:00 A M Medical Record Number: [id number] Patient Account Number: [id number] Date of Birth/Gender: Treating RN: 11-11-1950 (70 y.o. [name] Primary Care Physician: [name] Other Clinician: Referring Physician: Treating Physician/Extender: Bartholome Bill in Treatment: 15 Education Assessment Education Provided To: Patient Education Topics Provided Infection: Methods: Explain/Verbal Responses: State content correctly Wound/Skin Impairment: Methods: Explain/Verbal, Printed Responses: State content correctly Electronic Signature(s) Signed: 03/26/2021 5:01:42 PM By: [name] Entered By: [name] on 03/26/2021 08:25:47 -------------------------------------------------------------------------------- Wound Assessment Details Patient Name: Date of  Service: [name] 03/26/2021 8:00 A M Medical Record Number: [id number] Patient Account Number: [id number] Date of Birth/Sex: Treating RN: 1950/07/27 (70 y.o. [name] Primary Care Nakari Bracknell: [name] Other Clinician: Referring Zaylin Pistilli: Treating Torence Palmeri/Extender: Vivia Budge Weeks in Treatment: 15 Wound Status Wound Number: 3 Primary Venous Leg Ulcer Etiology: Wound Location: Right, Distal, Medial Lower Leg Wound Healed - Epithelialized Wounding Event: Gradually Appeared Status: Date Acquired: 12/11/2020 Comorbid Coronary Artery Disease, Peripheral Arterial Disease, Weeks Of Treatment: 15 History: Peripheral Venous Disease Clustered Wound: No Photos Wound Measurements Length: (cm) Width: (cm) Depth: (cm) Area: (cm) Volume: (cm) 0 % Reduction in Area: 100% 0 % Reduction in Volume: 100% 0 Epithelialization: Large (67-100%) 0 Tunneling: No 0 Undermining: No Wound Description Classification: Full Thickness Without Exposed Support Structures Wound Margin: Distinct, outline attached Exudate Amount: None Present Foul Odor After Cleansing: No Slough/Fibrino No Wound Bed Granulation Amount: Large (67-100%) Exposed Structure Granulation Quality: Red Fascia Exposed: No Necrotic Amount: Small (1-33%) Fat Layer (Subcutaneous Tissue) Exposed: Yes Necrotic Quality: Eschar Tendon Exposed: No Muscle Exposed: No Joint Exposed: No Bone Exposed: No Electronic Signature(s) Signed: 03/26/2021 5:01:42  PM By: [name] Entered By: [name] on 03/26/2021 08:59:47 -------------------------------------------------------------------------------- [place] Details Patient Name: Date of Service: [name] 03/26/2021 8:00 A M Medical Record Number: [id number] Patient Account Number: [id number] Date of Birth/Sex: Treating RN: June 16, 1951 (70 y.o. [name] Primary Care Shakara Tweedy: [name] Other Clinician: Referring  Vester Balthazor: Treating Wrigley Winborne/Extender: Bartholome Bill in Treatment: 15 Vital Signs Time Taken: 08:19 Temperature (F): 98.1 Height (in): 72 Pulse (bpm): 89 Weight (lbs): 162 Respiratory Rate (breaths/min): 16 Body Mass Index (BMI): 22 Blood Pressure (mmHg): 112/67 Reference Range: 80 - 120 mg / dl Electronic Signature(s) Signed: 03/26/2021 5:01:42 PM By: [name] Entered By: [name] on 03/26/2021 08:20:02

## 2021-03-29 NOTE — Telephone Encounter (Signed)
    Patient Name: [name]  DOB: 1951-03-04 MRN: [id number]  Primary Cardiologist: Quay Burow, MD  Chart reviewed as part of pre-operative protocol coverage. Given past medical history and time since last visit, based on ACC/AHA guidelines, [name] would be at acceptable risk for the planned procedure without further cardiovascular testing.  Patient exercised without any cardiac problem.  His Xarelto is managed by vascular doctor.  Patient is going to reach out to them directly.  The patient was advised that if he develops new symptoms prior to surgery to contact our office to arrange for a follow-up visit, and he verbalized understanding.  I will route this recommendation to the requesting party via Epic fax function and remove from pre-op pool.  Please call with questions.  [place], [place] 03/29/2021, 8:54 AM

## 2021-04-06 ENCOUNTER — Ambulatory Visit: Payer: PPO | Admitting: Cardiovascular Disease

## 2021-04-13 DIAGNOSIS — N5201 Erectile dysfunction due to arterial insufficiency: Secondary | ICD-10-CM | POA: Diagnosis not present

## 2021-04-13 DIAGNOSIS — R351 Nocturia: Secondary | ICD-10-CM | POA: Diagnosis not present

## 2021-04-13 DIAGNOSIS — R972 Elevated prostate specific antigen [PSA]: Secondary | ICD-10-CM | POA: Diagnosis not present

## 2021-04-13 DIAGNOSIS — N401 Enlarged prostate with lower urinary tract symptoms: Secondary | ICD-10-CM | POA: Diagnosis not present

## 2021-04-23 NOTE — Progress Notes (Signed)
Surgical Instructions   Your procedure is scheduled on Wednesday 05/02/2021.  Report to [facility] Entr[facility] "A" at 12:15 P.M., then check in with the Admitting office.  Call [phone] if you have problems or questions between now and the morning of surgery:   Remember: Do not eat after midnight the night before your surgery  You may drink clear liquids until 11:15am the morning of your surgery.   Clear liquids allowed are: Water, Non-Citrus Juices (without pulp), Carbonated Beverages, Clear Tea, Black Coffee Only (NO MILK, CREAM, or POWDERED CREAMER of any kind), and Gatorade  Please complete your PRE-SURGERY ENSURE that was provided to you by 11:15am the morning of surgery.  Please, if able, drink it in one sitting. DO NOT SIP.    Take these medicines the morning of surgery with A SIP OF WATER:  Amoxicillin-Clavulanate (Augmentin) Doxycycline (Vibra-tabs) Ezetimibe (Zetia) Tamsulosin (Flomax) - if started medication   As of today, NSAIDS - Aleve, Naproxen, Ibuprofen, Motrin, Advil, Goody's, BC's, all herbal medications, fish oil, and all vitamins.  Follow your physician's instructions on when to stop Aspirin, Cilostazol (Pletal), and Xarelto.  If no instructions were given by your physician then you will need to call the office to get those instructions.      After your COVID test/ prior to surgery:  You are not required to quarantine however you are required to wear a well-fitting mask when you are out and around people not in your household.  If your mask becomes wet or soiled, replace with a new one.  Wash your hands often with soap and water for 20 seconds or clean your hands with an alcohol-based hand sanitizer that contains at least 60% alcohol.  Do not share personal items.  Notify your provider: if you are in close contact with someone who has COVID  or if you develop a fever of 100.4 or greater, sneezing, cough, sore throat, shortness of breath or body  aches.          Do not wear jewelry  Do not wear lotions, powders, colognes, or deodorant.  Men may shave face and neck.  Do not bring valuables to the hospital - [facility] is not responsible for any belongings or valuables.  Do NOT Smoke (Tobacco/Vaping) or drink Alcohol 24 hours prior to your procedure  If you use a CPAP at night, you may bring all equipment for your overnight stay.   Contacts, glasses, hearing aids, dentures or partials may not be worn into surgery, please bring cases for these belongings   For patients admitted to the hospital, discharge time will be determined by your treatment team.   Patients discharged the day of surgery will not be allowed to drive home, and someone needs to stay with them for 24 hours.  NO VISITORS WILL BE ALLOWED IN PRE-OP WHERE PATIENTS ARE PREPPED FOR SURGERY.  ONLY 1 SUPPORT PERSON MAY BE PRESENT IN THE WAITING ROOM WHILE YOU ARE IN SURGERY.  IF YOU ARE TO BE ADMITTED, ONCE YOU ARE IN YOUR ROOM YOU WILL BE ALLOWED TWO (2) VISITORS. 1 (ONE) VISITOR MAY STAY OVERNIGHT BUT MUST ARRIVE TO THE ROOM BY 8pm.  Minor children may have two parents present. Special consideration for safety and communication needs will be reviewed on a case by case basis.  Special instructions:    Oral Hygiene is also important to reduce your risk of infection.  Remember - BRUSH YOUR TEETH THE MORNING OF SURGERY WITH YOUR REGULAR TOOTHPASTE  [facility]- Preparing For Surgery  Before surgery, you can play an important role. Because skin is not sterile, your skin needs to be as free of germs as possible. You can reduce the number of germs on your skin by washing with CHG (chlorahexidine gluconate) Soap before surgery.  CHG is an antiseptic cleaner which kills germs and bonds with the skin to continue killing germs even after washing.     Please do not use if you have an allergy to CHG or antibacterial soaps. If your skin becomes reddened/irritated stop using the  CHG.  Do not shave (including legs and underarms) for at least 48 hours prior to first CHG shower. It is OK to shave your face.  Please follow these instructions carefully.     Shower the NIGHT BEFORE SURGERY and the MORNING OF SURGERY with CHG Soap.   If you chose to wash your hair, wash your hair first as usual with your normal shampoo. After you shampoo, rinse your hair and body thoroughly to remove the shampoo.    Then [employer] and genitals (private parts) with your normal soap and rinse thoroughly to remove soap.  Next use the CHG Soap as you would any other liquid soap. You can apply CHG directly to the skin and wash gently with a clean washcloth.   Apply the CHG Soap to your body ONLY FROM THE NECK DOWN.  Do not use on open wounds or open sores. Avoid contact with your eyes, ears, mouth and genitals (private parts). Wash Face and genitals (private parts)  with your normal soap.   Wash thoroughly, paying special attention to the area where your surgery will be performed.  Thoroughly rinse your body with warm water from the neck down.  DO NOT shower/wash with your normal soap after using and rinsing off the CHG Soap.  Pat yourself dry with a CLEAN TOWEL.  Wear CLEAN PAJAMAS to bed the night before surgery  Place CLEAN SHEETS on your bed the night before your surgery  DO NOT SLEEP WITH PETS.   Day of Surgery:  Take a shower with CHG soap. Wear Clean/Comfortable clothing the morning of surgery Do not apply any deodorants/lotions.   Remember to brush your teeth WITH YOUR REGULAR TOOTHPASTE.   Please read over the following fact sheets that you were given.

## 2021-04-23 NOTE — Progress Notes (Signed)
[facility]  CHIEF COMPLAINT:    Follow up for right foot OM  SUBJECTIVE:    [name] is a 70 y.o. male with PMHx as below who presents to the clinic for right foot OM.   Patient was seen by me on 9/16 as new visit for right great toe OM.  He has a hx of PAD s/p right common femoral to peroneal bypass in April 2022 in the setting of necrotic right great toe ulcer.  His labs last month were reassuring with normal ESR/CRP/WBC.  He was started on Augmentin and Doxycycline for planned 6 weeks of therapy.  He has tolerated this well and states no adverse effects.  He reports no complications with his wound.   He is here today for 4 week follow up.   Please see A&P for the details of today's visit and status of the patient's medical problems.   Patient's Medications  New Prescriptions   No medications on file  Previous Medications   AMOXICILLIN-CLAVULANATE (AUGMENTIN) 875-125 MG TABLET    Take 1 tablet by mouth 2 (two) times daily.   ASPIRIN EC 81 MG TABLET    Take 81 mg by mouth daily. Swallow whole.   BIOTIN PO    Take 1 tablet by mouth daily.   CALCIUM PO    Take 1 tablet by mouth daily.   CILOSTAZOL (PLETAL) 50 MG TABLET    Take 1 tablet (50 mg total) by mouth 2 (two) times daily.   DOXYCYCLINE (VIBRA-TABS) 100 MG TABLET    Take 1 tablet (100 mg total) by mouth 2 (two) times daily.   EZETIMIBE (ZETIA) 10 MG TABLET    TAKE 1 TABLET BY MOUTH DAILY.   ROSUVASTATIN (CRESTOR) 40 MG TABLET    TAKE 1 TABLET BY MOUTH EVERY EVENING.   TAMSULOSIN (FLOMAX) 0.4 MG CAPS CAPSULE    Take 0.4 mg by mouth daily.   XARELTO 2.5 MG TABS TABLET    TAKE 1 TABLET BY MOUTH 2 TIMES DAILY.  Modified Medications   No medications on file  Discontinued Medications   No medications on file      Past Medical History:  Diagnosis Date   Hyperlipidemia    Hyperlipidemia    PAD (peripheral artery disease) (HCC)     Social History   Tobacco Use   Smoking status:  Never   Smokeless tobacco: Never  Vaping Use   Vaping Use: Never used  Substance Use Topics   Alcohol use: Yes    Alcohol/week: 4.0 standard drinks    Types: 2 Cans of beer, 2 Shots of liquor per week   Drug use: Never    Family History  Adopted: Yes    No Known Allergies  Review of Systems  Constitutional: Negative.   Respiratory: Negative.    Cardiovascular: Negative.   Gastrointestinal: Negative.   Musculoskeletal: Negative.     OBJECTIVE:    Vitals:   04/24/21 0831  BP: (!) 176/80  Pulse: 87  Resp: 16  Temp: 97.6 F (36.4 C)  TempSrc: Oral  SpO2: 98%  Weight: 169 lb (76.7 kg)   Body mass index is 22.92 kg/m.  Physical Exam Constitutional:      General: He is not in acute distress.    Appearance: Normal appearance.  Pulmonary:     Effort: Pulmonary effort is normal. No respiratory distress.  Musculoskeletal:     Comments: Healed right LE wound.  Skin:  General: Skin is warm and dry.     Findings: No rash.  Neurological:     General: No focal deficit present.     Mental Status: He is alert and oriented to person, place, and time.  Psychiatric:        Mood and Affect: Mood normal.        Behavior: Behavior normal.     Labs and Microbiology: CBC Latest Ref Rng & Units 03/23/2021 11/09/2020 11/01/2020  WBC 3.8 - 10.8 Thousand/uL 7.3 7.6 -  Hemoglobin 13.2 - 17.1 g/dL 14.9 12.7(L) 13.3  Hematocrit 38.5 - 50.0 % 45.1 39.4 39.0  Platelets 140 - 400 Thousand/uL 227 253 -   CMP Latest Ref Rng & Units 03/23/2021 01/02/2021 11/09/2020  Glucose 65 - 99 mg/dL 94 - 86  BUN 7 - 25 mg/dL 18 - 19  Creatinine 0.70 - 1.28 mg/dL 1.02 - 0.86  Sodium 135 - 146 mmol/L 141 - 140  Potassium 3.5 - 5.3 mmol/L 4.3 - 4.1  Chloride 98 - 110 mmol/L 105 - 103  CO2 20 - 32 mmol/L 33(H) - 28  Calcium 8.6 - 10.3 mg/dL 9.0 - 8.6(L)  Total Protein 6.0 - 8.5 g/dL - 6.7 6.5  Total Bilirubin 0.0 - 1.2 mg/dL - 0.7 0.4  Alkaline Phos 44 - 121 IU/L - 114 102  AST 0 - 40 IU/L - 37 29   ALT 0 - 44 IU/L - 52(H) 55(H)       ASSESSMENT & PLAN:    1. Chronic osteomyelitis of right foot ([place])  2. PAD (peripheral artery disease) (HCC)  History of right great toe chronic OM complicating an ischemic ulcer due to PAD.  Currently on Augmentin and Doxycycline empirically for planned 6 week course with stable inflammatory markers.  Will repeat labs today.  Continue antibiotics for 2 more weeks.  Will follow up again in 6 weeks for clinical re-evaluation.     [facility] 04/24/2021, 8:51 AM

## 2021-04-24 ENCOUNTER — Encounter: Payer: Self-pay | Admitting: Internal Medicine

## 2021-04-24 ENCOUNTER — Other Ambulatory Visit: Payer: Self-pay

## 2021-04-24 ENCOUNTER — Ambulatory Visit: Payer: PPO | Admitting: Internal Medicine

## 2021-04-24 ENCOUNTER — Encounter (HOSPITAL_COMMUNITY): Payer: Self-pay

## 2021-04-24 ENCOUNTER — Encounter (HOSPITAL_COMMUNITY)
Admission: RE | Admit: 2021-04-24 | Discharge: 2021-04-24 | Disposition: A | Discharge status: Home / Self Care | Payer: PPO | Source: Ambulatory Visit | Attending: General Surgery | Admitting: General Surgery

## 2021-04-24 VITALS — BP 176/80 | HR 87 | Temp 97.6°F | Resp 16 | Wt 169.0 lb

## 2021-04-24 DIAGNOSIS — Z01812 Encounter for preprocedural laboratory examination: Secondary | ICD-10-CM | POA: Diagnosis not present

## 2021-04-24 DIAGNOSIS — M86671 Other chronic osteomyelitis, right ankle and foot: Secondary | ICD-10-CM

## 2021-04-24 DIAGNOSIS — E785 Hyperlipidemia, unspecified: Secondary | ICD-10-CM | POA: Diagnosis not present

## 2021-04-24 DIAGNOSIS — I739 Peripheral vascular disease, unspecified: Secondary | ICD-10-CM

## 2021-04-24 DIAGNOSIS — I10 Essential (primary) hypertension: Secondary | ICD-10-CM | POA: Insufficient documentation

## 2021-04-24 NOTE — Patient Instructions (Signed)
Thank you for coming to see me today. It was a pleasure seeing you.  To Do: Continue doxycycline and augmentin for 2 more weeks Labs today Follow up in 6 weeks  If you have any questions or concerns, please do not hesitate to call the office at [phone].  Take Care,   Jule Ser

## 2021-04-24 NOTE — Progress Notes (Addendum)
PCP - [name], DO Cardiologist - Dr. Quay Burow  PPM/ICD - n/a  Chest x-ray - n/a EKG - 08/18/20 Stress Test - pt denies ECHO - pt denies Cardiac Cath - pt denies  Sleep Study - pt denies CPAP - n/a  Fasting Blood Sugar - n/a  Blood Thinner Instructions: Pt will reach out to doctor today about when to stop Pletal, Xarelto, and Aspirin.  ERAS Protcol - yes, clears until 11:15am PRE-SURGERY Ensure or G2- ensure  COVID TEST- n/a; ambulatory surgery  Anesthesia review: yes, pt reports that he has a wound on his right great toe that has been there for several months and he is currently on a "6 week maintenance abx regimen for." Pt said that he believes his surgeon is aware of this.  Patient denies shortness of breath, fever, cough and chest pain at PAT appointment   All instructions explained to the patient, with a verbal understanding of the material. Patient agrees to go over the instructions while at home for a better understanding. Patient also instructed to self quarantine after being tested for COVID-19. The opportunity to ask questions was provided.

## 2021-04-25 LAB — CBC
HCT: 46.4 % (ref 38.5–50.0)
Hemoglobin: 14.9 g/dL (ref 13.2–17.1)
MCH: 31 pg (ref 27.0–33.0)
MCHC: 32.1 g/dL (ref 32.0–36.0)
MCV: 96.5 fL (ref 80.0–100.0)
MPV: 11.8 fL (ref 7.5–12.5)
Platelets: 223 10*3/uL (ref 140–400)
RBC: 4.81 10*6/uL (ref 4.20–5.80)
RDW: 12.3 % (ref 11.0–15.0)
WBC: 7.1 10*3/uL (ref 3.8–10.8)

## 2021-04-25 LAB — BASIC METABOLIC PANEL
BUN: 21 mg/dL (ref 7–25)
CO2: 30 mmol/L (ref 20–32)
Calcium: 9.1 mg/dL (ref 8.6–10.3)
Chloride: 107 mmol/L (ref 98–110)
Creat: 0.87 mg/dL (ref 0.70–1.28)
Glucose, Bld: 85 mg/dL (ref 65–99)
Potassium: 5.1 mmol/L (ref 3.5–5.3)
Sodium: 143 mmol/L (ref 135–146)

## 2021-04-25 LAB — SEDIMENTATION RATE: Sed Rate: 2 mm/h (ref 0–20)

## 2021-04-25 LAB — C-REACTIVE PROTEIN: CRP: 0.5 mg/L (ref <8.0)

## 2021-04-26 ENCOUNTER — Telehealth: Payer: Self-pay

## 2021-04-26 ENCOUNTER — Encounter (HOSPITAL_COMMUNITY): Payer: Self-pay | Admitting: Physician Assistant

## 2021-04-26 NOTE — Progress Notes (Signed)
Anesthesia Chart Review:  Follows with cardiology for history of HTN, HLD, elevated coronary calcium score.  Cardiac clearance per telephone encounter 03/29/21, "Chart reviewed as part of pre-operative protocol coverage. Given past medical history and time since last visit, based on ACC/AHA guidelines, [name] would be at acceptable risk for the planned procedure without further cardiovascular testing.  Patient exercised without any cardiac problem. His Xarelto is managed by vascular doctor.  Patient is going to reach out to them directly."  Follows with vascular surgery for history of PAD s/p right common femoral to peroneal bypass 10/13/20, coil embolization of bypass fistula x2 11/01/20.  Per Dr. Stanford Breed, case need to be postponed as he does not recommend patient stopping Xarelto at this time given recent complex intervention.  Preop labs reviewed, unremarkable.  EKG 08/18/20: NSR. Rate 92.  CT cardiac scoring 06/30/2019: FINDINGS: Non-cardiac: See separate report from [facility].   Ascending Aorta: Upper normal ascending aortic size, 38.5 mm. No atherosclerosis.   Pericardium: Normal   Coronary arteries: Normal origin.  Mild scattered calcifications.   IMPRESSION: Coronary calcium score of 43. This was 90 percentile for age and sex matched control   [name] [name] Short Stay [facility]Anesthesiology Phone [phone] 04/27/2021 10:40 AM

## 2021-04-26 NOTE — Telephone Encounter (Signed)
[name], a nurse from [facility] requesting medication hold for pt's upcoming surgery. She is going to fax request over to [place] today.

## 2021-04-27 ENCOUNTER — Telehealth: Payer: Self-pay

## 2021-04-27 NOTE — Telephone Encounter (Signed)
Abigail Butts RN from [facility] office faxed over a note to request patient stop taking xarelto, pletal and aspirin 5 days prior to hernia surgery on 05/02/2021. Discussed with Dr. Stanford Breed who would like to see patient in office with ABI and RLE duplex prior to cessation of medication. Called Abigail Butts and informed her, moved patient appointment up one week. She will inform patient that his hernia surgery will need to be rescheduled and the new date and time of his appt with [facility].

## 2021-05-01 ENCOUNTER — Other Ambulatory Visit: Payer: Self-pay | Admitting: Physician Assistant

## 2021-05-14 NOTE — Progress Notes (Signed)
[facility]  PROGRESS NOTE  ASSESSMENT / PLAN: [name] is a 70 y.o. 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 His right great toe and right calf have healed thanks to the wound center. Continue bypass surveillance. Follow up with me in 6 months.   SUBJECTIVE: Continues to do well.  He is exercising.  He is working full time.   OBJECTIVE: BP 126/78 (BP Location: Left Arm, Patient Position: Sitting, Cuff Size: Normal)   Pulse 72   Temp 98.5 F (36.9 C)   Resp 20   Ht 6' (1.829 m)   Wt 166 lb (75.3 kg)   SpO2 99%   BMI 22.51 kg/m   Edema resolved with compression dressing. Right calf wound greatly improved Right great toe tip healed Right foot warm and well perfused  CBC Latest Ref Rng & Units 04/24/2021 03/23/2021 11/09/2020  WBC 3.8 - 10.8 Thousand/uL 7.1 7.3 7.6  Hemoglobin 13.2 - 17.1 g/dL 14.9 14.9 12.7(L)  Hematocrit 38.5 - 50.0 % 46.4 45.1 39.4  Platelets 140 - 400 Thousand/uL 223 227 253     CMP Latest Ref Rng & Units 04/24/2021 03/23/2021 01/02/2021  Glucose 65 - 99 mg/dL 85 94 -  BUN 7 - 25 mg/dL 21 18 -  Creatinine 0.70 - 1.28 mg/dL 0.87 1.02 -  Sodium 135 - 146 mmol/L 143 141 -  Potassium 3.5 - 5.3 mmol/L 5.1 4.3 -  Chloride 98 - 110 mmol/L 107 105 -  CO2 20 - 32 mmol/L 30 33(H) -  Calcium 8.6 - 10.3 mg/dL 9.1 9.0 -  Total Protein 6.0 - 8.5 g/dL - - 6.7  Total Bilirubin 0.0 - 1.2 mg/dL - - 0.7  Alkaline Phos 44 - 121 IU/L - - 114  AST 0 - 40 IU/L - - 37  ALT 0 - 44 IU/L - - 52(H)    +-------+-----------+-----------+------------+------------+  ABI/TBIToday's ABIToday's TBIPrevious ABIPrevious TBI  +-------+-----------+-----------+------------+------------+  Right  1.02       0.64       1.03        0.68          +-------+-----------+-----------+------------+------------+  Left   1.29       1.03       0.98        0.92           +-------+-----------+-----------+------------+------------+   Arterial duplex shows unchanged elevated velocity in proximal graft surggesting stenosis.   Angiogram personally reviewed - no evidence of inflow stenosis.  [name]. Stanford Breed, MD [facility] Phone Number: [phone] 05/15/2021 5:05 PM

## 2021-05-15 ENCOUNTER — Ambulatory Visit (HOSPITAL_COMMUNITY)
Admission: RE | Admit: 2021-05-15 | Discharge: 2021-05-15 | Disposition: A | Discharge status: Home / Self Care | Payer: PPO | Source: Ambulatory Visit | Attending: Vascular Surgery | Admitting: Vascular Surgery

## 2021-05-15 ENCOUNTER — Ambulatory Visit: Payer: PPO | Admitting: Vascular Surgery

## 2021-05-15 ENCOUNTER — Ambulatory Visit (INDEPENDENT_AMBULATORY_CARE_PROVIDER_SITE_OTHER)
Admission: RE | Admit: 2021-05-15 | Discharge: 2021-05-15 | Disposition: A | Discharge status: Home / Self Care | Payer: PPO | Source: Ambulatory Visit | Attending: Vascular Surgery | Admitting: Vascular Surgery

## 2021-05-15 ENCOUNTER — Other Ambulatory Visit: Payer: Self-pay

## 2021-05-15 ENCOUNTER — Encounter: Payer: Self-pay | Admitting: Vascular Surgery

## 2021-05-15 VITALS — BP 126/78 | HR 72 | Temp 98.5°F | Resp 20 | Ht 72.0 in | Wt 166.0 lb

## 2021-05-15 DIAGNOSIS — I739 Peripheral vascular disease, unspecified: Secondary | ICD-10-CM | POA: Insufficient documentation

## 2021-05-17 ENCOUNTER — Other Ambulatory Visit: Payer: Self-pay

## 2021-05-17 DIAGNOSIS — I739 Peripheral vascular disease, unspecified: Secondary | ICD-10-CM

## 2021-05-22 ENCOUNTER — Encounter (HOSPITAL_COMMUNITY): Payer: PPO

## 2021-05-22 ENCOUNTER — Ambulatory Visit: Payer: PPO | Admitting: Vascular Surgery

## 2021-06-04 ENCOUNTER — Other Ambulatory Visit: Payer: Self-pay

## 2021-06-04 ENCOUNTER — Ambulatory Visit: Payer: PPO | Admitting: Internal Medicine

## 2021-06-04 ENCOUNTER — Encounter: Payer: Self-pay | Admitting: Internal Medicine

## 2021-06-04 VITALS — BP 153/88 | HR 71 | Ht 72.0 in | Wt 168.0 lb

## 2021-06-04 DIAGNOSIS — M86671 Other chronic osteomyelitis, right ankle and foot: Secondary | ICD-10-CM | POA: Diagnosis not present

## 2021-06-04 DIAGNOSIS — I739 Peripheral vascular disease, unspecified: Secondary | ICD-10-CM

## 2021-06-04 NOTE — Progress Notes (Signed)
[facility] for Infectious Disease  CHIEF COMPLAINT:    Follow up for right foot osteomyelitis  SUBJECTIVE:    [name] is a 70 y.o. male with PMHx as below who presents to the clinic for right foot osteomyelitis.   Patient is here today for routine follow-up.  He was previously seen by me on 03/23/2021 as a new visit for right great toe osteomyelitis.  This is in the setting of PAD status post right common femoral to peroneal bypass in April 2022.  He has a history of a necrotic right great toe ulcer with imaging suggestive of osteomyelitis.  His toe ulcer has healed status post revascularization.  He completed 6 weeks of Augmentin and doxycycline earlier this month with normal inflammatory markers when last checked on 04/24/2021.  He presents today for clinical follow-up and reports doing well.  He is exercising regularly and only notices some discomfort when hitting toe hard.  No swelling or redness of toe.  He saw his vascular surgeon on 05/15/2021 and had no new complaints.  He will follow-up again with them in 6 months.  Please see A&P for the details of today's visit and status of the patient's medical problems.   Patient's Medications  New Prescriptions   No medications on file  Previous Medications   ASPIRIN EC 81 MG TABLET    Take 81 mg by mouth daily. Swallow whole.   BIOTIN PO    Take 1 tablet by mouth daily.   CALCIUM PO    Take 1 tablet by mouth daily.   CILOSTAZOL (PLETAL) 50 MG TABLET    TAKE 1 TABLET BY MOUTH 2 TIMES DAILY.   EZETIMIBE (ZETIA) 10 MG TABLET    TAKE 1 TABLET BY MOUTH DAILY.   ROSUVASTATIN (CRESTOR) 40 MG TABLET    TAKE 1 TABLET BY MOUTH EVERY EVENING.   TAMSULOSIN (FLOMAX) 0.4 MG CAPS CAPSULE    Take 0.4 mg by mouth daily.   XARELTO 2.5 MG TABS TABLET    TAKE 1 TABLET BY MOUTH 2 TIMES DAILY.  Modified Medications   No medications on file  Discontinued Medications   No medications on file      Past Medical History:  Diagnosis Date    Hyperlipidemia    Hyperlipidemia    PAD (peripheral artery disease) (HCC)     Social History   Tobacco Use   Smoking status: Never   Smokeless tobacco: Never  Vaping Use   Vaping Use: Never used  Substance Use Topics   Alcohol use: Yes    Alcohol/week: 4.0 standard drinks    Types: 2 Cans of beer, 2 Shots of liquor per week    Comment: socially   Drug use: Never    Family History  Adopted: Yes    No Known Allergies  Review of Systems  Constitutional: Negative.   Respiratory: Negative.    Cardiovascular: Negative.   Gastrointestinal: Negative.   Musculoskeletal: Negative.     OBJECTIVE:    Vitals:   06/04/21 0827  BP: (!) 153/88  Pulse: 71  SpO2: 99%  Weight: 168 lb (76.2 kg)  Height: 6' (1.829 m)   Body mass index is 22.78 kg/m.  Physical Exam Constitutional:      General: He is not in acute distress.    Appearance: Normal appearance.  HENT:     Head: Normocephalic and atraumatic.  Pulmonary:     Effort: Pulmonary effort is normal. No respiratory distress.  Musculoskeletal:     Comments: Right great toe ulcer healed.  No drainage, erythema, warmth.  Neurological:     General: No focal deficit present.     Mental Status: He is alert and oriented to person, place, and time.  Psychiatric:        Mood and Affect: Mood normal.        Behavior: Behavior normal.     Labs and Microbiology: CBC Latest Ref Rng & Units 04/24/2021 03/23/2021 11/09/2020  WBC 3.8 - 10.8 Thousand/uL 7.1 7.3 7.6  Hemoglobin 13.2 - 17.1 g/dL 14.9 14.9 12.7(L)  Hematocrit 38.5 - 50.0 % 46.4 45.1 39.4  Platelets 140 - 400 Thousand/uL 223 227 253   CMP Latest Ref Rng & Units 04/24/2021 03/23/2021 01/02/2021  Glucose 65 - 99 mg/dL 85 94 -  BUN 7 - 25 mg/dL 21 18 -  Creatinine 0.70 - 1.28 mg/dL 0.87 1.02 -  Sodium 135 - 146 mmol/L 143 141 -  Potassium 3.5 - 5.3 mmol/L 5.1 4.3 -  Chloride 98 - 110 mmol/L 107 105 -  CO2 20 - 32 mmol/L 30 33(H) -  Calcium 8.6 - 10.3 mg/dL 9.1 9.0  -  Total Protein 6.0 - 8.5 g/dL - - 6.7  Total Bilirubin 0.0 - 1.2 mg/dL - - 0.7  Alkaline Phos 44 - 121 IU/L - - 114  AST 0 - 40 IU/L - - 37  ALT 0 - 44 IU/L - - 52(H)       ASSESSMENT & PLAN:    1. Chronic osteomyelitis of right foot ([place])  2. PAD (peripheral artery disease) ([place])   Patient has history of right great toe osteomyelitis complicating an ischemic ulcer due to PAD.  His wound has healed status post revascularization earlier this year.  He completed 6 weeks of Augmentin and doxycycline empirically.  His inflammatory markers were normal last month.  Return precautions discussed with patient and follow-up as needed.       Raynelle [facility] 06/04/2021, 8:51 AM

## 2021-06-05 ENCOUNTER — Encounter (HOSPITAL_COMMUNITY): Admission: RE | Payer: Self-pay | Source: Home / Self Care

## 2021-06-05 ENCOUNTER — Ambulatory Visit (HOSPITAL_COMMUNITY): Admission: RE | Admit: 2021-06-05 | Payer: PPO | Source: Home / Self Care | Admitting: General Surgery

## 2021-06-05 SURGERY — REPAIR, HERNIA, INGUINAL, BILATERAL, ROBOT-ASSISTED
Anesthesia: General | Laterality: Bilateral

## 2021-06-11 DIAGNOSIS — L723 Sebaceous cyst: Secondary | ICD-10-CM | POA: Diagnosis not present

## 2021-06-11 DIAGNOSIS — L812 Freckles: Secondary | ICD-10-CM | POA: Diagnosis not present

## 2021-06-11 DIAGNOSIS — Z85828 Personal history of other malignant neoplasm of skin: Secondary | ICD-10-CM | POA: Diagnosis not present

## 2021-06-11 DIAGNOSIS — L570 Actinic keratosis: Secondary | ICD-10-CM | POA: Diagnosis not present

## 2021-06-11 DIAGNOSIS — L821 Other seborrheic keratosis: Secondary | ICD-10-CM | POA: Diagnosis not present

## 2021-06-11 DIAGNOSIS — D225 Melanocytic nevi of trunk: Secondary | ICD-10-CM | POA: Diagnosis not present

## 2021-06-11 DIAGNOSIS — D2262 Melanocytic nevi of left upper limb, including shoulder: Secondary | ICD-10-CM | POA: Diagnosis not present

## 2021-06-25 ENCOUNTER — Other Ambulatory Visit: Payer: Self-pay | Admitting: Vascular Surgery

## 2021-08-21 DIAGNOSIS — N529 Male erectile dysfunction, unspecified: Secondary | ICD-10-CM | POA: Diagnosis not present

## 2021-08-21 DIAGNOSIS — D6869 Other thrombophilia: Secondary | ICD-10-CM | POA: Diagnosis not present

## 2021-08-21 DIAGNOSIS — I700 Atherosclerosis of aorta: Secondary | ICD-10-CM | POA: Diagnosis not present

## 2021-08-21 DIAGNOSIS — K4090 Unilateral inguinal hernia, without obstruction or gangrene, not specified as recurrent: Secondary | ICD-10-CM | POA: Diagnosis not present

## 2021-08-21 DIAGNOSIS — I7025 Atherosclerosis of native arteries of other extremities with ulceration: Secondary | ICD-10-CM | POA: Diagnosis not present

## 2021-08-21 DIAGNOSIS — Z23 Encounter for immunization: Secondary | ICD-10-CM | POA: Diagnosis not present

## 2021-08-21 DIAGNOSIS — L98499 Non-pressure chronic ulcer of skin of other sites with unspecified severity: Secondary | ICD-10-CM | POA: Diagnosis not present

## 2021-08-21 DIAGNOSIS — E785 Hyperlipidemia, unspecified: Secondary | ICD-10-CM | POA: Diagnosis not present

## 2021-08-21 DIAGNOSIS — J069 Acute upper respiratory infection, unspecified: Secondary | ICD-10-CM | POA: Diagnosis not present

## 2021-08-21 DIAGNOSIS — Z95828 Presence of other vascular implants and grafts: Secondary | ICD-10-CM | POA: Diagnosis not present

## 2021-08-21 DIAGNOSIS — I739 Peripheral vascular disease, unspecified: Secondary | ICD-10-CM | POA: Diagnosis not present

## 2021-08-23 ENCOUNTER — Encounter: Payer: Self-pay | Admitting: Vascular Surgery

## 2021-08-23 ENCOUNTER — Other Ambulatory Visit: Payer: Self-pay | Admitting: Cardiovascular Disease

## 2021-09-10 DIAGNOSIS — R972 Elevated prostate specific antigen [PSA]: Secondary | ICD-10-CM | POA: Diagnosis not present

## 2021-10-05 DIAGNOSIS — I700 Atherosclerosis of aorta: Secondary | ICD-10-CM | POA: Diagnosis not present

## 2021-10-05 DIAGNOSIS — N401 Enlarged prostate with lower urinary tract symptoms: Secondary | ICD-10-CM | POA: Diagnosis not present

## 2021-10-05 DIAGNOSIS — E785 Hyperlipidemia, unspecified: Secondary | ICD-10-CM | POA: Diagnosis not present

## 2021-10-22 ENCOUNTER — Other Ambulatory Visit: Payer: Self-pay | Admitting: Vascular Surgery

## 2021-11-16 ENCOUNTER — Encounter: Payer: Self-pay | Admitting: Vascular Surgery

## 2021-11-22 IMAGING — DX DG TOE GREAT 2+V*R*
3 series · 3 of 3 positions shown · non-contrast
Comparison: MRI 08/24/2015.  Radiographs 07/27/2015.

CLINICAL DATA: Wound.  Nonhealing wound of the great toe.

EXAM:
RIGHT GREAT TOE

[toe ap]
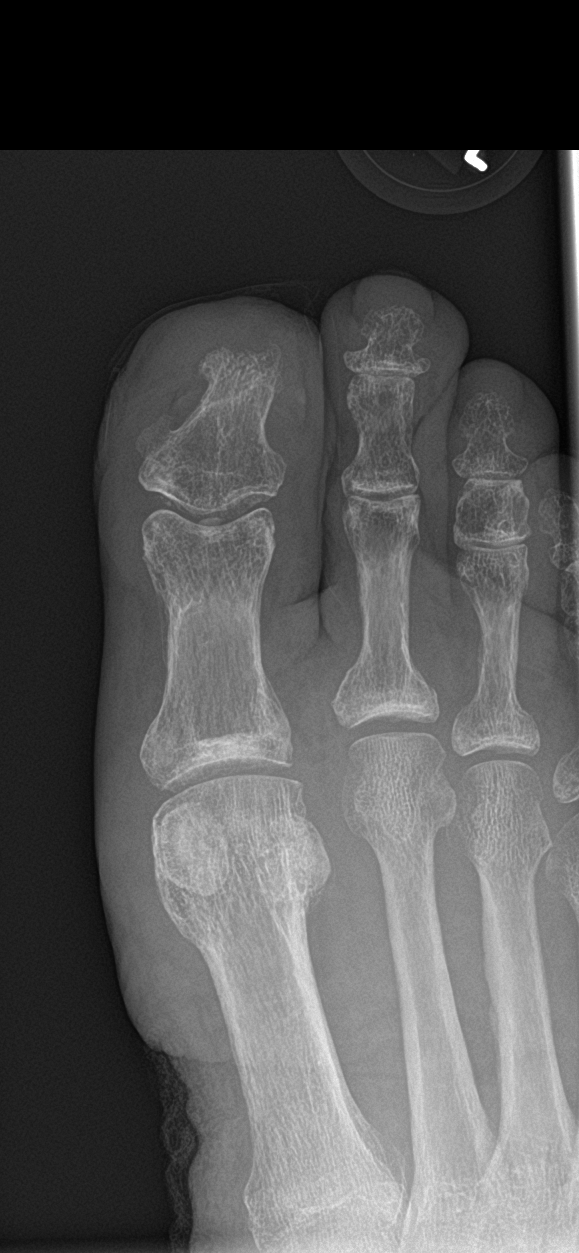

[toe obl]
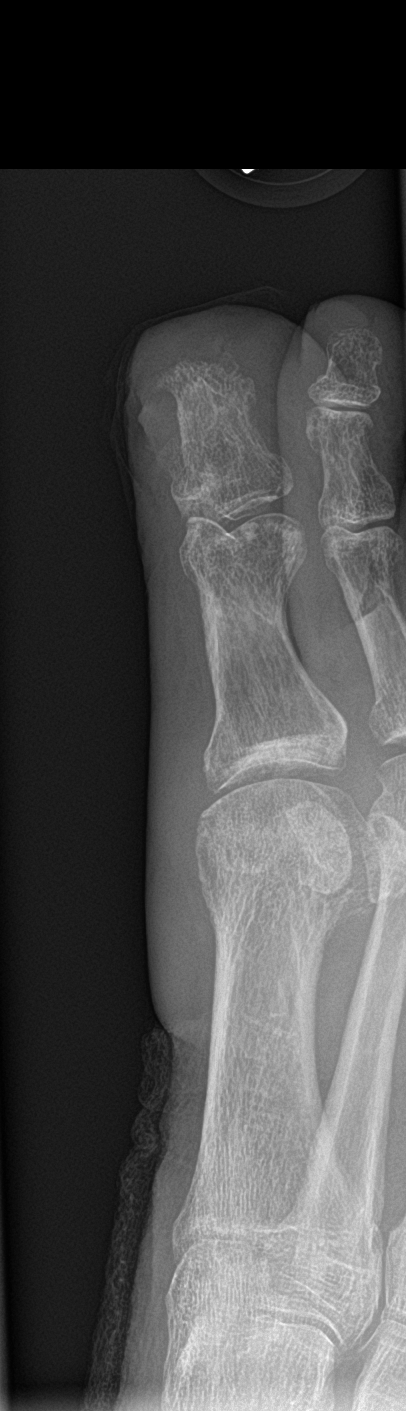

[toe lat]
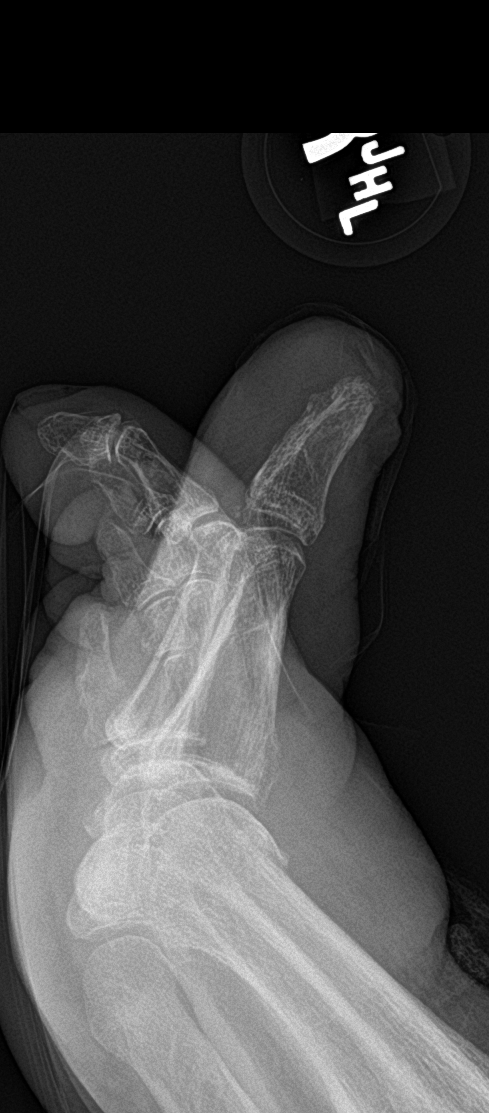

[3 of 3 positions shown; findings below may reference images not displayed]

FINDINGS: The bones are demineralized. There is soft tissue swelling in the
great toe without definite soft tissue emphysema or foreign body.
Compared with the prior radiographs, there is new erosion of the
distal tuft of the 1st distal phalanx which is suspicious for
osteomyelitis in the absence of interval surgery. The proximal
phalanx appears intact. No evidence of acute fracture or
dislocation.
IMPRESSION: Interval tuftal erosion of the distal 1st phalanx consistent with
osteomyelitis in the absence of interval surgery. Generalized soft
tissue swelling without evidence of foreign body.

## 2021-12-05 DIAGNOSIS — C44519 Basal cell carcinoma of skin of other part of trunk: Secondary | ICD-10-CM | POA: Diagnosis not present

## 2021-12-05 DIAGNOSIS — D2271 Melanocytic nevi of right lower limb, including hip: Secondary | ICD-10-CM | POA: Diagnosis not present

## 2021-12-05 DIAGNOSIS — D2272 Melanocytic nevi of left lower limb, including hip: Secondary | ICD-10-CM | POA: Diagnosis not present

## 2021-12-05 DIAGNOSIS — L570 Actinic keratosis: Secondary | ICD-10-CM | POA: Diagnosis not present

## 2021-12-05 DIAGNOSIS — D225 Melanocytic nevi of trunk: Secondary | ICD-10-CM | POA: Diagnosis not present

## 2021-12-05 DIAGNOSIS — L821 Other seborrheic keratosis: Secondary | ICD-10-CM | POA: Diagnosis not present

## 2021-12-05 DIAGNOSIS — L812 Freckles: Secondary | ICD-10-CM | POA: Diagnosis not present

## 2021-12-05 DIAGNOSIS — D485 Neoplasm of uncertain behavior of skin: Secondary | ICD-10-CM | POA: Diagnosis not present

## 2021-12-05 DIAGNOSIS — Z85828 Personal history of other malignant neoplasm of skin: Secondary | ICD-10-CM | POA: Diagnosis not present

## 2021-12-17 NOTE — Progress Notes (Signed)
[facility] [place] PROGRESS NOTE  ASSESSMENT / PLAN: [name] is a 71 y.o. 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 His right great toe and right calf have healed.  Surveillance duplex continues to show a possible retained valve causing focal stenosis in the proximal graft.  He is ankle-brachial index and toe pressure have dropped.  I recommended angiogram, which she is willing to proceed with.  We will not treat any further fistulas if we encountered them, will only investigate the proximal aspect of the graft and treat any stenosis, if we see it.  SUBJECTIVE: Continues to do well.  Exercising well.  Ambulating without restriction.  OBJECTIVE: Pulse 71   Temp 98.4 F (36.9 C)   Resp 20   Ht 6' (1.829 m)   Wt 168 lb (76.2 kg)   SpO2 98%   BMI 22.78 kg/m   Edema resolved with compression dressing. Right calf wound greatly improved Right great toe tip healed Right foot warm and well perfused     Latest Ref Rng & Units 04/24/2021    8:55 AM 03/23/2021    2:09 PM 11/09/2020    3:26 PM  CBC  WBC 3.8 - 10.8 Thousand/uL 7.1  7.3  7.6   Hemoglobin 13.2 - 17.1 g/dL 14.9  14.9  12.7   Hematocrit 38.5 - 50.0 % 46.4  45.1  39.4   Platelets 140 - 400 Thousand/uL 223  227  253         Latest Ref Rng & Units 04/24/2021    8:55 AM 03/23/2021    2:09 PM 01/02/2021   11:11 AM  CMP  Glucose 65 - 99 mg/dL 85  94    BUN 7 - 25 mg/dL 21  18    Creatinine 0.70 - 1.28 mg/dL 0.87  1.02    Sodium 135 - 146 mmol/L 143  141    Potassium 3.5 - 5.3 mmol/L 5.1  4.3    Chloride 98 - 110 mmol/L 107  105    CO2 20 - 32 mmol/L 30  33    Calcium 8.6 - 10.3 mg/dL 9.1  9.0    Total Protein 6.0 - 8.5 g/dL   6.7   Total Bilirubin 0.0 - 1.2 mg/dL   0.7   Alkaline Phos 44 - 121 IU/L   114   AST 0 - 40 IU/L   37   ALT 0 - 44 IU/L   52      +-------+-----------+-----------+------------+------------+   ABI/TBIToday's ABIToday's TBIPrevious ABIPrevious TBI  +-------+-----------+-----------+------------+------------+  Right  0.91       0.52       1.02        0.64          +-------+-----------+-----------+------------+------------+  Left   1.10       0.73       1.29        1.03          +-------+-----------+-----------+------------+------------+   Arterial duplex shows elevated velocity in proximal graft.  This is been seen in previous duplex evaluations.  [name]. Stanford Breed, MD [facility] Phone Number: [phone] 12/18/2021 4:55 PM

## 2021-12-18 ENCOUNTER — Encounter: Payer: Self-pay | Admitting: Vascular Surgery

## 2021-12-18 ENCOUNTER — Ambulatory Visit (INDEPENDENT_AMBULATORY_CARE_PROVIDER_SITE_OTHER)
Admission: RE | Admit: 2021-12-18 | Discharge: 2021-12-18 | Disposition: A | Discharge status: Home / Self Care | Payer: PPO | Source: Ambulatory Visit | Attending: Vascular Surgery | Admitting: Vascular Surgery

## 2021-12-18 ENCOUNTER — Ambulatory Visit (HOSPITAL_COMMUNITY)
Admission: RE | Admit: 2021-12-18 | Discharge: 2021-12-18 | Disposition: A | Discharge status: Home / Self Care | Payer: PPO | Source: Ambulatory Visit | Attending: Vascular Surgery | Admitting: Vascular Surgery

## 2021-12-18 ENCOUNTER — Ambulatory Visit: Payer: PPO | Admitting: Vascular Surgery

## 2021-12-18 VITALS — BP 143/74 | HR 71 | Temp 98.4°F | Resp 20 | Ht 72.0 in | Wt 168.0 lb

## 2021-12-18 DIAGNOSIS — I739 Peripheral vascular disease, unspecified: Secondary | ICD-10-CM

## 2021-12-19 ENCOUNTER — Encounter: Payer: Self-pay | Admitting: Vascular Surgery

## 2021-12-19 DIAGNOSIS — T1512XA Foreign body in conjunctival sac, left eye, initial encounter: Secondary | ICD-10-CM | POA: Diagnosis not present

## 2021-12-20 ENCOUNTER — Other Ambulatory Visit: Payer: Self-pay

## 2021-12-20 DIAGNOSIS — I739 Peripheral vascular disease, unspecified: Secondary | ICD-10-CM

## 2021-12-28 ENCOUNTER — Other Ambulatory Visit: Payer: Self-pay

## 2021-12-28 ENCOUNTER — Ambulatory Visit (HOSPITAL_COMMUNITY)
Admission: RE | Admit: 2021-12-28 | Discharge: 2021-12-28 | Disposition: A | Discharge status: Home / Self Care | Payer: PPO | Attending: Vascular Surgery | Admitting: Vascular Surgery

## 2021-12-28 ENCOUNTER — Encounter (HOSPITAL_COMMUNITY)
Admission: RE | Disposition: A | Discharge status: Home / Self Care | Payer: Self-pay | Source: Home / Self Care | Attending: Vascular Surgery

## 2021-12-28 DIAGNOSIS — T82858A Stenosis of vascular prosthetic devices, implants and grafts, initial encounter: Secondary | ICD-10-CM | POA: Diagnosis not present

## 2021-12-28 DIAGNOSIS — T82868A Thrombosis of vascular prosthetic devices, implants and grafts, initial encounter: Secondary | ICD-10-CM | POA: Diagnosis not present

## 2021-12-28 DIAGNOSIS — I739 Peripheral vascular disease, unspecified: Secondary | ICD-10-CM | POA: Diagnosis not present

## 2021-12-28 DIAGNOSIS — Y832 Surgical operation with anastomosis, bypass or graft as the cause of abnormal reaction of the patient, or of later complication, without mention of misadventure at the time of the procedure: Secondary | ICD-10-CM | POA: Insufficient documentation

## 2021-12-28 HISTORY — PX: PERIPHERAL VASCULAR INTERVENTION: CATH118257

## 2021-12-28 HISTORY — PX: ABDOMINAL AORTOGRAM W/LOWER EXTREMITY: CATH118223

## 2021-12-28 LAB — POCT I-STAT, CHEM 8
BUN: 23 mg/dL (ref 8–23)
Calcium, Ion: 1.17 mmol/L (ref 1.15–1.40)
Chloride: 99 mmol/L (ref 98–111)
Creatinine, Ser: 1 mg/dL (ref 0.61–1.24)
Glucose, Bld: 106 mg/dL — ABNORMAL HIGH (ref 70–99)
HCT: 45 % (ref 39.0–52.0)
Hemoglobin: 15.3 g/dL (ref 13.0–17.0)
Potassium: 3.9 mmol/L (ref 3.5–5.1)
Sodium: 139 mmol/L (ref 135–145)
TCO2: 28 mmol/L (ref 22–32)

## 2021-12-28 SURGERY — ABDOMINAL AORTOGRAM W/LOWER EXTREMITY
Anesthesia: LOCAL | Laterality: Right

## 2021-12-28 MED ORDER — SODIUM CHLORIDE 0.9% FLUSH: 3.0000 mL | INTRAVENOUS | Status: DC | PRN

## 2021-12-28 MED ORDER — SODIUM CHLORIDE 0.9 % IV SOLN: 250.0000 mL | INTRAVENOUS | Status: DC | PRN

## 2021-12-28 MED ORDER — HEPARIN (PORCINE) IN NACL 1000-0.9 UT/500ML-% IV SOLN
INTRAVENOUS | Status: DC | PRN
  Administered 2021-12-28 (×2): 500 mL

## 2021-12-28 MED ORDER — FENTANYL CITRATE (PF) 100 MCG/2ML IJ SOLN
INTRAMUSCULAR | Status: AC
  Filled 2021-12-28: qty 2

## 2021-12-28 MED ORDER — FENTANYL CITRATE (PF) 100 MCG/2ML IJ SOLN
INTRAMUSCULAR | Status: DC | PRN
  Administered 2021-12-28: 50 ug via INTRAVENOUS

## 2021-12-28 MED ORDER — HEPARIN SODIUM (PORCINE) 1000 UNIT/ML IJ SOLN
INTRAMUSCULAR | Status: AC
  Filled 2021-12-28: qty 10

## 2021-12-28 MED ORDER — SODIUM CHLORIDE 0.9 % WEIGHT BASED INFUSION: 1.0000 mL/kg/h | INTRAVENOUS | Status: DC

## 2021-12-28 MED ORDER — IODIXANOL 320 MG/ML IV SOLN
INTRAVENOUS | Status: DC | PRN
  Administered 2021-12-28: 120 mL

## 2021-12-28 MED ORDER — ONDANSETRON HCL 4 MG/2ML IJ SOLN: 4.0000 mg | Freq: Four times a day (QID) | INTRAMUSCULAR | Status: DC | PRN

## 2021-12-28 MED ORDER — LIDOCAINE HCL (PF) 1 % IJ SOLN
INTRAMUSCULAR | Status: DC | PRN
  Administered 2021-12-28: 12 mL

## 2021-12-28 MED ORDER — LIDOCAINE HCL (PF) 1 % IJ SOLN
INTRAMUSCULAR | Status: AC
Start: 2021-12-28 — End: ?
  Filled 2021-12-28: qty 30

## 2021-12-28 MED ORDER — MIDAZOLAM HCL 2 MG/2ML IJ SOLN
INTRAMUSCULAR | Status: AC
  Filled 2021-12-28: qty 2

## 2021-12-28 MED ORDER — RIVAROXABAN 2.5 MG PO TABS: 2.5000 mg | ORAL_TABLET | Freq: Two times a day (BID) | ORAL | 11 refills | Status: DC

## 2021-12-28 MED ORDER — HEPARIN (PORCINE) IN NACL 1000-0.9 UT/500ML-% IV SOLN
INTRAVENOUS | Status: AC
Start: 2021-12-28 — End: ?
  Filled 2021-12-28: qty 1000

## 2021-12-28 MED ORDER — ATROPINE SULFATE 1 MG/10ML IJ SOSY
PREFILLED_SYRINGE | INTRAMUSCULAR | Status: AC
  Filled 2021-12-28: qty 10

## 2021-12-28 MED ORDER — SODIUM CHLORIDE 0.9 % IV SOLN: INTRAVENOUS | Status: DC

## 2021-12-28 MED ORDER — HYDRALAZINE HCL 20 MG/ML IJ SOLN: 5.0000 mg | INTRAMUSCULAR | Status: DC | PRN

## 2021-12-28 MED ORDER — SODIUM CHLORIDE 0.9% FLUSH: 3.0000 mL | Freq: Two times a day (BID) | INTRAVENOUS | Status: DC

## 2021-12-28 MED ORDER — LABETALOL HCL 5 MG/ML IV SOLN: 10.0000 mg | INTRAVENOUS | Status: DC | PRN

## 2021-12-28 MED ORDER — RIVAROXABAN 2.5 MG PO TABS: 2.5000 mg | ORAL_TABLET | Freq: Two times a day (BID) | ORAL | Status: DC

## 2021-12-28 MED ORDER — ACETAMINOPHEN 325 MG PO TABS: 650.0000 mg | ORAL_TABLET | ORAL | Status: DC | PRN

## 2021-12-28 MED ORDER — MIDAZOLAM HCL 2 MG/2ML IJ SOLN
INTRAMUSCULAR | Status: DC | PRN
  Administered 2021-12-28: 1 mg via INTRAVENOUS

## 2021-12-28 MED ORDER — ATROPINE SULFATE 1 MG/10ML IJ SOSY
PREFILLED_SYRINGE | INTRAMUSCULAR | Status: DC | PRN
  Administered 2021-12-28: .5 mg via INTRAVENOUS

## 2021-12-28 MED ORDER — HEPARIN SODIUM (PORCINE) 1000 UNIT/ML IJ SOLN
INTRAMUSCULAR | Status: DC | PRN
  Administered 2021-12-28: 2000 [IU] via INTRAVENOUS
  Administered 2021-12-28: 8000 [IU] via INTRAVENOUS

## 2021-12-28 SURGICAL SUPPLY — 22 items
BALLN STERLING OTW 6X60X135 (BALLOONS) ×2
BALLOON STERLING OTW 6X60X135 (BALLOONS) IMPLANT
CANISTER PENUMBRA ENGINE (MISCELLANEOUS) ×1 IMPLANT
CATH INDIGO 8 XTORQ TIP 115CM (CATHETERS) ×1 IMPLANT
CATH INDIGO SEP 7 (CATHETERS) ×1 IMPLANT
CATH OMNI FLUSH 5F 65CM (CATHETERS) ×1 IMPLANT
CATH QUICKCROSS SUPP .035X90CM (MICROCATHETER) ×1 IMPLANT
CLOSURE PERCLOSE PROSTYLE (VASCULAR PRODUCTS) ×1 IMPLANT
GLIDEWIRE ADV .035X260CM (WIRE) ×1 IMPLANT
KIT ENCORE 26 ADVANTAGE (KITS) ×1 IMPLANT
KIT MICROPUNCTURE NIT STIFF (SHEATH) ×1 IMPLANT
KIT PV (KITS) ×2 IMPLANT
SHEATH CHECKFLO FLEXOR 8F 45CM (SHEATH) ×1 IMPLANT
SHEATH DESTINATION 8F 45CM (SHEATH) ×1 IMPLANT
SHEATH PINNACLE 5F 10CM (SHEATH) ×1 IMPLANT
SHEATH PINNACLE 8F 10CM (SHEATH) ×1 IMPLANT
STENT VIABAHN 7X7.5X120 (Permanent Stent) ×1 IMPLANT
SYR MEDRAD MARK V 150ML (SYRINGE) ×1 IMPLANT
TRANSDUCER W/STOPCOCK (MISCELLANEOUS) ×2 IMPLANT
TRAY PV CATH (CUSTOM PROCEDURE TRAY) ×2 IMPLANT
WIRE BENTSON .035X145CM (WIRE) ×1 IMPLANT
WIRE SHEPHERD 30G .018 (WIRE) ×1 IMPLANT

## 2021-12-29 ENCOUNTER — Encounter (HOSPITAL_COMMUNITY): Payer: Self-pay | Admitting: Vascular Surgery

## 2022-01-24 ENCOUNTER — Other Ambulatory Visit: Payer: Self-pay | Admitting: *Deleted

## 2022-01-24 DIAGNOSIS — I70211 Atherosclerosis of native arteries of extremities with intermittent claudication, right leg: Secondary | ICD-10-CM

## 2022-01-24 DIAGNOSIS — I739 Peripheral vascular disease, unspecified: Secondary | ICD-10-CM

## 2022-01-24 DIAGNOSIS — I7025 Atherosclerosis of native arteries of other extremities with ulceration: Secondary | ICD-10-CM

## 2022-02-05 NOTE — Progress Notes (Signed)
Error

## 2022-02-12 ENCOUNTER — Ambulatory Visit (INDEPENDENT_AMBULATORY_CARE_PROVIDER_SITE_OTHER)
Admission: RE | Admit: 2022-02-12 | Discharge: 2022-02-12 | Disposition: A | Discharge status: Home / Self Care | Payer: PPO | Source: Ambulatory Visit | Attending: Physician Assistant | Admitting: Physician Assistant

## 2022-02-12 ENCOUNTER — Ambulatory Visit (INDEPENDENT_AMBULATORY_CARE_PROVIDER_SITE_OTHER): Payer: PPO | Admitting: Vascular Surgery

## 2022-02-12 ENCOUNTER — Encounter: Payer: Self-pay | Admitting: Vascular Surgery

## 2022-02-12 ENCOUNTER — Ambulatory Visit (HOSPITAL_COMMUNITY)
Admission: RE | Admit: 2022-02-12 | Discharge: 2022-02-12 | Disposition: A | Discharge status: Home / Self Care | Payer: PPO | Source: Ambulatory Visit | Attending: Physician Assistant | Admitting: Physician Assistant

## 2022-02-12 VITALS — BP 134/92 | HR 64 | Temp 98.1°F | Resp 20 | Ht 72.0 in | Wt 159.4 lb

## 2022-02-12 DIAGNOSIS — I739 Peripheral vascular disease, unspecified: Secondary | ICD-10-CM | POA: Insufficient documentation

## 2022-02-12 DIAGNOSIS — I70211 Atherosclerosis of native arteries of extremities with intermittent claudication, right leg: Secondary | ICD-10-CM

## 2022-02-12 DIAGNOSIS — I7025 Atherosclerosis of native arteries of other extremities with ulceration: Secondary | ICD-10-CM | POA: Diagnosis not present

## 2022-02-12 NOTE — Progress Notes (Signed)
Office Note     CC:  follow up Requesting Provider:  [name], DO  HPI: [name] is a 71 y.o. (01-28-1951) male who presents for follow up status post Aortogram Arteriogram of RLE with Mechanical thrombectomy of right lower extremity femoral peroneal bypass and Right femoral-peroneal bypass angioplasty and stenting (7x17m Viabahn) 12/28/21. This was performed secondary to threatened bypass graft on non invasive studies at time of follow up. He reports since intervention he has done very well.  He has no symptoms in his right lower extremity.  He continues to take aspirin and Xarelto.  The pt is on a statin for cholesterol management.  The pt is on a daily aspirin.   Other AC:  Xarelto 2.5 mg BID The pt is not on medication for hypertension.   The pt is not diabetic.   Tobacco hx:  never  Past Medical History:  Diagnosis Date   Hyperlipidemia    Hyperlipidemia    PAD (peripheral artery disease) ([place]     Past Surgical History:  Procedure Laterality Date   ABDOMINAL AORTOGRAM W/LOWER EXTREMITY [name] 09/11/2020   Procedure: ABDOMINAL AORTOGRAM W/LOWER EXTREMITY;  Surgeon: BLorretta Harp MD;  [facility];  Service: Cardiovascular;  Laterality: [name];   ABDOMINAL AORTOGRAM W/LOWER EXTREMITY [name] 11/01/2020   Procedure: ABDOMINAL AORTOGRAM W/LOWER EXTREMITY;  Surgeon: HCherre Robins MD;  [facility];  Service: Cardiovascular;  Laterality: [name];   ABDOMINAL AORTOGRAM W/LOWER EXTREMITY Right 12/28/2021   Procedure: ABDOMINAL AORTOGRAM W/LOWER EXTREMITY;  Surgeon: HCherre Robins MD;  [facility];  Service: Cardiovascular;  Laterality: Right;   COLONOSCOPY     x2   FEMORAL-TIBIAL BYPASS GRAFT Right 10/13/2020   Procedure: RIGHT COMMON FEMORAL ARTERY TO PERONEAL ARTERY BYPASS WITH INSITU GREATER SAPHENOUS VEIN;  Surgeon: HCherre Robins MD;  [facility]  Service: Vascular;  Laterality: Right;   PERIPHERAL VASCULAR  INTERVENTION Right 11/01/2020   Procedure: PERIPHERAL VASCULAR INTERVENTION;  Surgeon: HCherre Robins MD;  [facility];  Service: Cardiovascular;  Laterality: Right;   PERIPHERAL VASCULAR INTERVENTION Right 12/28/2021   Procedure: PERIPHERAL VASCULAR INTERVENTION;  Surgeon: HCherre Robins MD;  [facility];  Service: Cardiovascular;  Laterality: Right;  FEM/TIB BYPASS GRAFT   WITH THOMBECTOMY   TONSILLECTOMY     TONSILLECTOMY AND ADENOIDECTOMY      Social History   Socioeconomic History   Marital status: Married    Spouse name: [name]   Number of children: [name]   Years of education: [name]   Highest education level: [name]  Occupational History   [name]  Tobacco Use   Smoking status: Never    Passive exposure: Never   Smokeless tobacco: Never  Vaping Use   Vaping Use: Never used  Substance and Sexual Activity   Alcohol use: Yes    Alcohol/week: 4.0 standard drinks of alcohol    Types: 2 Cans of beer, 2 Shots of liquor per week    Comment: socially   Drug use: Never   Sexual activity: [name]  Other Topics Concern   [name]  Social History Narrative   [name]   Social Determinants of Health   Financial Resource Strain: [name]  Food Insecurity: [name]  Transportation Needs: [name]  Physical Activity: [name]  Stress: [name]  Social Connections: [name]  Intimate Partner Violence: [name]    Family History  Adopted: Yes    Current Outpatient Medications  Medication Sig Dispense Refill   aspirin EC 81 MG tablet Take 81 mg by mouth daily. Swallow whole.     BIOTIN PO Take 1 tablet by mouth daily.     CALCIUM PO Take 1 tablet by mouth daily.     cholecalciferol (VITAMIN D3) 25 MCG (1000 UNIT) tablet Take 1,000 Units by mouth daily.     ezetimibe (ZETIA) 10 MG tablet TAKE 1 TABLET BY MOUTH DAILY. 90 tablet 1   MAGNESIUM CITRATE PO Take 1 tablet by mouth daily.      rivaroxaban (XARELTO) 2.5 MG TABS tablet Take 1 tablet (2.5 mg total) by mouth 2 (two) times daily. 60 tablet 11   rosuvastatin (CRESTOR) 40 MG tablet TAKE 1 TABLET BY MOUTH EVERY EVENING. 90 tablet 3   Current Facility-Administered Medications  Medication Dose Route Frequency Provider Last Rate Last Admin   sodium chloride flush (NS) 0.9 % injection 3 mL  3 mL Intravenous Q12H [name], MD        No Known Allergies   PHYSICAL EXAMINATION:  Vitals:   02/12/22 1445  BP: (!) 134/92  Pulse: 64  Resp: 20  Temp: 98.1 F (36.7 C)  TempSrc: Temporal  SpO2: 99%  Weight: 159 lb 6.4 oz (72.3 kg)  Height: 6' (1.829 m)   [name] Regular rate and rhythm Unlabored breathing Right foot is warm and well-perfused  Non-Invasive Vascular Imaging:    +-------+-----------+-----------+------------+------------+  ABI/TBIToday's ABIToday's TBIPrevious ABIPrevious TBI  +-------+-----------+-----------+------------+------------+  Right  1.10       0.64       0.91        0.52          +-------+-----------+-----------+------------+------------+  Left   1.26       1.01       1.10        0.73          +-------+-----------+-----------+------------+------------+   Right lower extremity duplex: Widely patent femoral to peroneal bypass with proximal graft stent  placement. No evidence of stenosis.    ASSESSMENT/PLAN:: 71 y.o. male here for follow up status post Aortogram Arteriogram of RLE with Mechanical thrombectomy of right lower extremity femoral peroneal bypass and Right femoral-peroneal bypass angioplasty and stenting (7x85m Viabahn) 12/28/21  - ABI and RLE duplex today shows good technical result from intervention -Continue Aspirin, statin and Xarelto -Follow-up with me in 3 months with repeat ABI  TYevonne Aline HStanford Breed MD [facility]Phone Number: [phone]/02/2022 4:54 PM

## 2022-03-01 DIAGNOSIS — H5203 Hypermetropia, bilateral: Secondary | ICD-10-CM | POA: Diagnosis not present

## 2022-03-01 DIAGNOSIS — H2513 Age-related nuclear cataract, bilateral: Secondary | ICD-10-CM | POA: Diagnosis not present

## 2022-03-07 ENCOUNTER — Other Ambulatory Visit: Payer: Self-pay | Admitting: Cardiology

## 2022-03-15 ENCOUNTER — Other Ambulatory Visit: Payer: Self-pay | Admitting: Cardiology

## 2022-04-22 DIAGNOSIS — R972 Elevated prostate specific antigen [PSA]: Secondary | ICD-10-CM | POA: Diagnosis not present

## 2022-04-29 DIAGNOSIS — R972 Elevated prostate specific antigen [PSA]: Secondary | ICD-10-CM | POA: Diagnosis not present

## 2022-04-29 DIAGNOSIS — R351 Nocturia: Secondary | ICD-10-CM | POA: Diagnosis not present

## 2022-04-29 DIAGNOSIS — N401 Enlarged prostate with lower urinary tract symptoms: Secondary | ICD-10-CM | POA: Diagnosis not present

## 2022-05-14 ENCOUNTER — Ambulatory Visit: Payer: PPO | Admitting: Vascular Surgery

## 2022-05-14 DIAGNOSIS — Z125 Encounter for screening for malignant neoplasm of prostate: Secondary | ICD-10-CM | POA: Diagnosis not present

## 2022-05-14 DIAGNOSIS — R7989 Other specified abnormal findings of blood chemistry: Secondary | ICD-10-CM | POA: Diagnosis not present

## 2022-05-14 DIAGNOSIS — E785 Hyperlipidemia, unspecified: Secondary | ICD-10-CM | POA: Diagnosis not present

## 2022-05-14 DIAGNOSIS — I700 Atherosclerosis of aorta: Secondary | ICD-10-CM | POA: Diagnosis not present

## 2022-05-20 NOTE — Progress Notes (Unsigned)
Office Note     CC:  follow up Requesting Provider:  [name], DO  HPI: [name] is a 71 y.o. (01-28-1951) male who presents for follow up status post Aortogram Arteriogram of RLE with Mechanical thrombectomy of right lower extremity femoral peroneal bypass and Right femoral-peroneal bypass angioplasty and stenting (7x17m Viabahn) 12/28/21. This was performed secondary to threatened bypass graft on non invasive studies at time of follow up. He reports since intervention he has done very well.  He has no symptoms in his right lower extremity.  He continues to take aspirin and Xarelto.  The pt is on a statin for cholesterol management.  The pt is on a daily aspirin.   Other AC:  Xarelto 2.5 mg BID The pt is not on medication for hypertension.   The pt is not diabetic.   Tobacco hx:  never  Past Medical History:  Diagnosis Date   Hyperlipidemia    Hyperlipidemia    PAD (peripheral artery disease) ([place]     Past Surgical History:  Procedure Laterality Date   ABDOMINAL AORTOGRAM W/LOWER EXTREMITY [name] 09/11/2020   Procedure: ABDOMINAL AORTOGRAM W/LOWER EXTREMITY;  Surgeon: BLorretta Harp MD;  [facility];  Service: Cardiovascular;  Laterality: [name];   ABDOMINAL AORTOGRAM W/LOWER EXTREMITY [name] 11/01/2020   Procedure: ABDOMINAL AORTOGRAM W/LOWER EXTREMITY;  Surgeon: HCherre Robins MD;  [facility];  Service: Cardiovascular;  Laterality: [name];   ABDOMINAL AORTOGRAM W/LOWER EXTREMITY Right 12/28/2021   Procedure: ABDOMINAL AORTOGRAM W/LOWER EXTREMITY;  Surgeon: HCherre Robins MD;  [facility];  Service: Cardiovascular;  Laterality: Right;   COLONOSCOPY     x2   FEMORAL-TIBIAL BYPASS GRAFT Right 10/13/2020   Procedure: RIGHT COMMON FEMORAL ARTERY TO PERONEAL ARTERY BYPASS WITH INSITU GREATER SAPHENOUS VEIN;  Surgeon: HCherre Robins MD;  [facility]  Service: Vascular;  Laterality: Right;   PERIPHERAL VASCULAR  INTERVENTION Right 11/01/2020   Procedure: PERIPHERAL VASCULAR INTERVENTION;  Surgeon: HCherre Robins MD;  [facility];  Service: Cardiovascular;  Laterality: Right;   PERIPHERAL VASCULAR INTERVENTION Right 12/28/2021   Procedure: PERIPHERAL VASCULAR INTERVENTION;  Surgeon: HCherre Robins MD;  [facility];  Service: Cardiovascular;  Laterality: Right;  FEM/TIB BYPASS GRAFT   WITH THOMBECTOMY   TONSILLECTOMY     TONSILLECTOMY AND ADENOIDECTOMY      Social History   Socioeconomic History   Marital status: Married    Spouse name: [name]   Number of children: [name]   Years of education: [name]   Highest education level: [name]  Occupational History   [name]  Tobacco Use   Smoking status: Never    Passive exposure: Never   Smokeless tobacco: Never  Vaping Use   Vaping Use: Never used  Substance and Sexual Activity   Alcohol use: Yes    Alcohol/week: 4.0 standard drinks of alcohol    Types: 2 Cans of beer, 2 Shots of liquor per week    Comment: socially   Drug use: Never   Sexual activity: [name]  Other Topics Concern   [name]  Social History Narrative   [name]   Social Determinants of Health   Financial Resource Strain: [name]  Food Insecurity: [name]  Transportation Needs: [name]  Physical Activity: [name]  Stress: [name]  Social Connections: [name]  Intimate Partner Violence: [name]    Family History  Adopted: Yes    Current Outpatient Medications  Medication Sig Dispense Refill   aspirin EC 81 MG tablet Take 81 mg by mouth daily. Swallow whole.     BIOTIN PO Take 1 tablet by mouth daily.     CALCIUM PO Take 1 tablet by mouth daily.     cholecalciferol (VITAMIN D3) 25 MCG (1000 UNIT) tablet Take 1,000 Units by mouth daily.     ezetimibe (ZETIA) 10 MG tablet TAKE 1 TABLET BY MOUTH DAILY. 90 tablet 1   MAGNESIUM CITRATE PO Take 1 tablet by mouth daily.      rivaroxaban (XARELTO) 2.5 MG TABS tablet Take 1 tablet (2.5 mg total) by mouth 2 (two) times daily. 60 tablet 11   rosuvastatin (CRESTOR) 40 MG tablet TAKE 1 TABLET BY MOUTH EVERY EVENING. 90 tablet 3   Current Facility-Administered Medications  Medication Dose Route Frequency Provider Last Rate Last Admin   sodium chloride flush (NS) 0.9 % injection 3 mL  3 mL Intravenous Q12H [name], MD        No Known Allergies   PHYSICAL EXAMINATION:  Vitals:   02/12/22 1445  BP: (!) 134/92  Pulse: 64  Resp: 20  Temp: 98.1 F (36.7 C)  TempSrc: Temporal  SpO2: 99%  Weight: 159 lb 6.4 oz (72.3 kg)  Height: 6' (1.829 m)   [name] Regular rate and rhythm Unlabored breathing Right foot is warm and well-perfused  Non-Invasive Vascular Imaging:    +-------+-----------+-----------+------------+------------+  ABI/TBIToday's ABIToday's TBIPrevious ABIPrevious TBI  +-------+-----------+-----------+------------+------------+  Right  1.10       0.64       0.91        0.52          +-------+-----------+-----------+------------+------------+  Left   1.26       1.01       1.10        0.73          +-------+-----------+-----------+------------+------------+   Right lower extremity duplex: Widely patent femoral to peroneal bypass with proximal graft stent  placement. No evidence of stenosis.    ASSESSMENT/PLAN:: 71 y.o. male here for follow up status post Aortogram Arteriogram of RLE with Mechanical thrombectomy of right lower extremity femoral peroneal bypass and Right femoral-peroneal bypass angioplasty and stenting (7x85m Viabahn) 12/28/21  - ABI and RLE duplex today shows good technical result from intervention -Continue Aspirin, statin and Xarelto -Follow-up with me in 3 months with repeat ABI  TYevonne Aline HStanford Breed MD [facility] Number: [phone]/02/2022 4:54 PM

## 2022-05-21 ENCOUNTER — Ambulatory Visit: Payer: PPO | Admitting: Vascular Surgery

## 2022-05-21 ENCOUNTER — Encounter: Payer: Self-pay | Admitting: Vascular Surgery

## 2022-05-21 VITALS — BP 141/79 | HR 67 | Temp 98.4°F | Resp 20 | Ht 72.0 in | Wt 167.0 lb

## 2022-05-21 DIAGNOSIS — I739 Peripheral vascular disease, unspecified: Secondary | ICD-10-CM

## 2022-05-21 DIAGNOSIS — Z1331 Encounter for screening for depression: Secondary | ICD-10-CM | POA: Diagnosis not present

## 2022-05-21 DIAGNOSIS — K4090 Unilateral inguinal hernia, without obstruction or gangrene, not specified as recurrent: Secondary | ICD-10-CM | POA: Diagnosis not present

## 2022-05-21 DIAGNOSIS — D6869 Other thrombophilia: Secondary | ICD-10-CM | POA: Diagnosis not present

## 2022-05-21 DIAGNOSIS — Z95828 Presence of other vascular implants and grafts: Secondary | ICD-10-CM | POA: Diagnosis not present

## 2022-05-21 DIAGNOSIS — I700 Atherosclerosis of aorta: Secondary | ICD-10-CM | POA: Diagnosis not present

## 2022-05-21 DIAGNOSIS — Z0000 Encounter for general adult medical examination without abnormal findings: Secondary | ICD-10-CM | POA: Diagnosis not present

## 2022-05-21 DIAGNOSIS — I7025 Atherosclerosis of native arteries of other extremities with ulceration: Secondary | ICD-10-CM | POA: Diagnosis not present

## 2022-05-21 DIAGNOSIS — Z23 Encounter for immunization: Secondary | ICD-10-CM | POA: Diagnosis not present

## 2022-05-21 DIAGNOSIS — D692 Other nonthrombocytopenic purpura: Secondary | ICD-10-CM | POA: Diagnosis not present

## 2022-05-21 DIAGNOSIS — C4490 Unspecified malignant neoplasm of skin, unspecified: Secondary | ICD-10-CM | POA: Diagnosis not present

## 2022-05-21 DIAGNOSIS — E785 Hyperlipidemia, unspecified: Secondary | ICD-10-CM | POA: Diagnosis not present

## 2022-05-21 DIAGNOSIS — L98499 Non-pressure chronic ulcer of skin of other sites with unspecified severity: Secondary | ICD-10-CM | POA: Diagnosis not present

## 2022-05-21 DIAGNOSIS — Z1339 Encounter for screening examination for other mental health and behavioral disorders: Secondary | ICD-10-CM | POA: Diagnosis not present

## 2022-05-24 ENCOUNTER — Other Ambulatory Visit: Payer: Self-pay

## 2022-05-24 DIAGNOSIS — I7025 Atherosclerosis of native arteries of other extremities with ulceration: Secondary | ICD-10-CM

## 2022-05-24 DIAGNOSIS — I739 Peripheral vascular disease, unspecified: Secondary | ICD-10-CM

## 2022-06-10 DIAGNOSIS — L570 Actinic keratosis: Secondary | ICD-10-CM | POA: Diagnosis not present

## 2022-06-10 DIAGNOSIS — L821 Other seborrheic keratosis: Secondary | ICD-10-CM | POA: Diagnosis not present

## 2022-06-10 DIAGNOSIS — C44519 Basal cell carcinoma of skin of other part of trunk: Secondary | ICD-10-CM | POA: Diagnosis not present

## 2022-06-10 DIAGNOSIS — L812 Freckles: Secondary | ICD-10-CM | POA: Diagnosis not present

## 2022-06-10 DIAGNOSIS — D485 Neoplasm of uncertain behavior of skin: Secondary | ICD-10-CM | POA: Diagnosis not present

## 2022-06-10 DIAGNOSIS — D0359 Melanoma in situ of other part of trunk: Secondary | ICD-10-CM | POA: Diagnosis not present

## 2022-06-10 DIAGNOSIS — D2261 Melanocytic nevi of right upper limb, including shoulder: Secondary | ICD-10-CM | POA: Diagnosis not present

## 2022-06-10 DIAGNOSIS — Z85828 Personal history of other malignant neoplasm of skin: Secondary | ICD-10-CM | POA: Diagnosis not present

## 2022-06-10 DIAGNOSIS — L820 Inflamed seborrheic keratosis: Secondary | ICD-10-CM | POA: Diagnosis not present

## 2022-07-18 DIAGNOSIS — D0359 Melanoma in situ of other part of trunk: Secondary | ICD-10-CM | POA: Diagnosis not present

## 2022-07-18 DIAGNOSIS — Z85828 Personal history of other malignant neoplasm of skin: Secondary | ICD-10-CM | POA: Diagnosis not present

## 2022-07-18 DIAGNOSIS — L988 Other specified disorders of the skin and subcutaneous tissue: Secondary | ICD-10-CM | POA: Diagnosis not present

## 2022-09-30 DIAGNOSIS — R5383 Other fatigue: Secondary | ICD-10-CM | POA: Diagnosis not present

## 2022-09-30 DIAGNOSIS — D6869 Other thrombophilia: Secondary | ICD-10-CM | POA: Diagnosis not present

## 2022-09-30 DIAGNOSIS — R0981 Nasal congestion: Secondary | ICD-10-CM | POA: Diagnosis not present

## 2022-09-30 DIAGNOSIS — J029 Acute pharyngitis, unspecified: Secondary | ICD-10-CM | POA: Diagnosis not present

## 2022-09-30 DIAGNOSIS — R059 Cough, unspecified: Secondary | ICD-10-CM | POA: Diagnosis not present

## 2022-09-30 DIAGNOSIS — Z1152 Encounter for screening for COVID-19: Secondary | ICD-10-CM | POA: Diagnosis not present

## 2022-09-30 DIAGNOSIS — B349 Viral infection, unspecified: Secondary | ICD-10-CM | POA: Diagnosis not present

## 2022-11-01 ENCOUNTER — Other Ambulatory Visit: Payer: Self-pay | Admitting: *Deleted

## 2022-11-07 ENCOUNTER — Ambulatory Visit (INDEPENDENT_AMBULATORY_CARE_PROVIDER_SITE_OTHER)
Admission: RE | Admit: 2022-11-07 | Discharge: 2022-11-07 | Disposition: A | Discharge status: Home / Self Care | Payer: PPO | Source: Ambulatory Visit | Attending: Vascular Surgery | Admitting: Vascular Surgery

## 2022-11-07 ENCOUNTER — Ambulatory Visit (HOSPITAL_COMMUNITY)
Admission: RE | Admit: 2022-11-07 | Discharge: 2022-11-07 | Disposition: A | Discharge status: Home / Self Care | Payer: PPO | Source: Ambulatory Visit | Attending: Vascular Surgery | Admitting: Vascular Surgery

## 2022-11-07 DIAGNOSIS — I739 Peripheral vascular disease, unspecified: Secondary | ICD-10-CM | POA: Diagnosis not present

## 2022-11-07 DIAGNOSIS — I7025 Atherosclerosis of native arteries of other extremities with ulceration: Secondary | ICD-10-CM

## 2022-11-08 LAB — VAS US ABI WITH/WO TBI
Left ABI: 1.45
Right ABI: 1.24

## 2022-11-11 NOTE — Progress Notes (Unsigned)
[facility] [place]  ASSESSMENT / PLAN: [name] is a 72 y.o. male with history of right femoral-peroneal bypass 10/13/20 for ischemic ulceration of right foot. 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 Clinical exam and non-invasive testing reassuring today.   Recommend::  Complete cessation from all tobacco products. Blood glucose control with goal A1c < 7%. Blood pressure control with goal blood pressure < 140/90 mmHg. Lipid reduction therapy with goal LDL-C <100 mg/dL (<60 if symptomatic from PAD).  Aspirin 81mg  PO QD.  Rivaroxaban 2.5mg  PO BID. Atorvastatin 40-80mg  PO QD (or other "high intensity" statin therapy).  Follow up with me in 6 months with ABI / RLE arterial duplex.  CHIEF COMPLAINT: follow up  HISTORY OF PRESENT ILLNESS: [name] is a 72 y.o. male very well known to me with complex peripheral vascular history. He initially presented to care in Spring 2022 with disabling claudication. A cardiologist had previously informed him "nothing could be done" for his leg. He sought an opinion from me. A trial of watchful waiting was performed, but he returned to care with ischemic ulceration of the great toe about a month later. He underwent right femoral - peroneal bypass which he recovered from well. He healed his foot rapidly. He returned to exercise quickly. He had significant swelling in his leg postoperatively. Angiography revealed fistulae which were treated with coil embolization. He then progressed a proximal stenotic lesion and had low velocities in the graft. This was treated with angioplasty and stenting with good result.  He presents today for surveillance.  He is doing very well.  He is exercising is much as he likes.  He has some very mild calf cramping when walking uphill for a very long distance but otherwise feels great.  VASCULAR SURGICAL HISTORY:  10/13/20:  right common femoral to peroneal artery bypass with in-situ greater saphenous vein  11/01/20: Coil embolization of arteriovenous fistula x 2 in R fem-pop bypass (6mm Nestor coils x 4 total, 2 in each fistula)  12/28/21: Right femoral-peroneal bypass angioplasty and stenting (7x41mm Viabahn)   VASCULAR RISK FACTORS: Negative history of stroke / transient ischemic attack. Negative history of coronary artery disease.  Negative history of diabetes mellitus.  Negative history of smoking.  Positive history of hypertension.  Negative history of chronic kidney disease.   Negative history of chronic obstructive pulmonary disease.  FUNCTIONAL STATUS: ECOG performance status: (0) Fully active, able to carry on all predisease performance without restriction Ambulatory status: Ambulatory within the community without limits  Past Medical History:  Diagnosis Date   Hyperlipidemia    Hyperlipidemia    PAD (peripheral artery disease) (H[facility])     Past Surgical History:  Procedure Laterality Date   ABDOMINAL AORTOGRAM W/LOWER EXTREMITY [name] 09/11/2020   Procedure: ABDOMINAL AORTOGRAM W/LOWER EXTREMITY;  Surgeon: Runell Gess, MD;  [facility];  Service: Cardiovascular;  Laterality: [name];   ABDOMINAL AORTOGRAM W/LOWER EXTREMITY [name] 11/01/2020   Procedure: ABDOMINAL AORTOGRAM W/LOWER EXTREMITY;  Surgeon: Leonie Douglas, MD;  [facility];  Service: Cardiovascular;  Laterality: [name];   ABDOMINAL AORTOGRAM W/LOWER EXTREMITY Right 12/28/2021   Procedure: ABDOMINAL AORTOGRAM W/LOWER EXTREMITY;  Surgeon: Leonie Douglas, MD;  [facility];  Service: Cardiovascular;  Laterality: Right;   COLONOSCOPY     x2   FEMORAL-TIBIAL BYPASS GRAFT Right 10/13/2020   Procedure: RIGHT COMMON FEMORAL ARTERY TO  PERONEAL ARTERY BYPASS WITH INSITU GREATER SAPHENOUS VEIN;  Surgeon: Leonie Douglas, MD;  [facility];  Service: Vascular;  Laterality: Right;   PERIPHERAL VASCULAR  INTERVENTION Right 11/01/2020   Procedure: PERIPHERAL VASCULAR INTERVENTION;  Surgeon: Leonie Douglas, MD;  [facility];  Service: Cardiovascular;  Laterality: Right;   PERIPHERAL VASCULAR INTERVENTION Right 12/28/2021   Procedure: PERIPHERAL VASCULAR INTERVENTION;  Surgeon: Leonie Douglas, MD;  [facility];  Service: Cardiovascular;  Laterality: Right;  FEM/TIB BYPASS GRAFT   WITH THOMBECTOMY   TONSILLECTOMY     TONSILLECTOMY AND ADENOIDECTOMY      Family History  Adopted: Yes    Social History   Socioeconomic History   Marital status: Married    Spouse name: [name]   Number of children: [name]   Years of education: [name]   Highest education level: [name]  Occupational History   [name]  Tobacco Use   Smoking status: Never    Passive exposure: Never   Smokeless tobacco: Never  Vaping Use   Vaping Use: Never used  Substance and Sexual Activity   Alcohol use: Yes    Alcohol/week: 4.0 standard drinks of alcohol    Types: 2 Cans of beer, 2 Shots of liquor per week    Comment: socially   Drug use: Never   Sexual activity: [name]  Other Topics Concern   [name]  Social History Narrative   [name]   Social Determinants of Health   Financial Resource Strain: [name]  Food Insecurity: [name]  Transportation Needs: [name]  Physical Activity: [name]  Stress: [name]  Social Connections: [name]  Intimate Partner Violence: [name]    No Known Allergies  Current Outpatient Medications  Medication Sig Dispense Refill   aspirin EC 81 MG tablet Take 81 mg by mouth daily. Swallow whole.     BIOTIN PO Take 1 tablet by mouth daily.     CALCIUM PO Take 1 tablet by mouth daily.     cholecalciferol (VITAMIN D3) 25 MCG (1000 UNIT) tablet Take 1,000 Units by mouth daily.     ezetimibe (ZETIA) 10 MG tablet TAKE 1 TABLET BY MOUTH DAILY. 90 tablet 1   MAGNESIUM CITRATE PO Take 1  tablet by mouth daily.     rivaroxaban (XARELTO) 2.5 MG TABS tablet Take 1 tablet (2.5 mg total) by mouth 2 (two) times daily. 60 tablet 11   rosuvastatin (CRESTOR) 40 MG tablet TAKE 1 TABLET BY MOUTH EVERY EVENING. 90 tablet 3   Current Facility-Administered Medications  Medication Dose Route Frequency Provider Last Rate Last Admin   sodium chloride flush (NS) 0.9 % injection 3 mL  3 mL Intravenous Q12H Runell Gess, MD        PHYSICAL EXAM There were no vitals filed for this visit.  [name].  He appears younger than his stated age. Right leg well healed and warm.   PERTINENT LABORATORY AND RADIOLOGIC DATA  Most recent CBC    Latest Ref Rng & Units 12/28/2021    5:53 AM 04/24/2021    8:55 AM 03/23/2021    2:09 PM  CBC  WBC 3.8 - 10.8 Thousand/uL  7.1  7.3   Hemoglobin 13.0 - 17.0 g/dL 16.1  09.6  04.5   Hematocrit 39.0 - 52.0 %  45.0  46.4  45.1   Platelets 140 - 400 Thousand/uL  223  227      Most recent CMP    Latest Ref Rng & Units 12/28/2021    5:53 AM 04/24/2021    8:55 AM 03/23/2021    2:09 PM  CMP  Glucose 70 - 99 mg/dL 161  85  94   BUN 8 - 23 mg/dL 23  21  18    Creatinine 0.61 - 1.24 mg/dL 0.96  0.45  4.09   Sodium 135 - 145 mmol/L 139  143  141   Potassium 3.5 - 5.1 mmol/L 3.9  5.1  4.3   Chloride 98 - 111 mmol/L 99  107  105   CO2 20 - 32 mmol/L  30  33   Calcium 8.6 - 10.3 mg/dL  9.1  9.0     Renal function CrCl cannot be calculated (Patient's most recent lab result is older than the maximum 21 days allowed.).  No results found for: "HGBA1C"  LDL Chol Calc (NIH)  Date Value Ref Range Status  01/02/2021 70 0 - 99 mg/dL Final     +-------+-----------+-----------+------------+------------+  ABI/TBIToday's ABIToday's TBIPrevious ABIPrevious TBI  +-------+-----------+-----------+------------+------------+  Right 1.24       0.57       1.10        0.64           +-------+-----------+-----------+------------+------------+  Left  1.45       0.91       1.26        1.01          +-------+-----------+-----------+------------+------------+   Arterial duplex shows good flow, no stenosis in bypass graft  Rande Brunt. Lenell Antu, MD FACS [facility] Phone Number: [phone] 11/11/2022 9:11 AM   Total time spent on preparing this encounter including chart review, data review, collecting history, examining the patient, coordinating care for this established patient, 20 minutes.  Portions of this report may have been transcribed using voice recognition software.  Every effort has been made to ensure accuracy; however, inadvertent computerized transcription errors may still be present.

## 2022-11-12 ENCOUNTER — Ambulatory Visit: Payer: PPO | Admitting: Vascular Surgery

## 2022-11-12 ENCOUNTER — Encounter: Payer: Self-pay | Admitting: Vascular Surgery

## 2022-11-12 VITALS — BP 132/68 | HR 78 | Temp 98.3°F | Resp 20 | Ht 72.0 in | Wt 166.0 lb

## 2022-11-12 DIAGNOSIS — I739 Peripheral vascular disease, unspecified: Secondary | ICD-10-CM

## 2022-11-19 ENCOUNTER — Other Ambulatory Visit: Payer: Self-pay | Admitting: Cardiology

## 2022-11-19 DIAGNOSIS — R972 Elevated prostate specific antigen [PSA]: Secondary | ICD-10-CM | POA: Diagnosis not present

## 2022-11-19 DIAGNOSIS — I739 Peripheral vascular disease, unspecified: Secondary | ICD-10-CM | POA: Diagnosis not present

## 2022-11-19 DIAGNOSIS — I700 Atherosclerosis of aorta: Secondary | ICD-10-CM | POA: Diagnosis not present

## 2022-11-19 DIAGNOSIS — L98499 Non-pressure chronic ulcer of skin of other sites with unspecified severity: Secondary | ICD-10-CM | POA: Diagnosis not present

## 2022-11-19 DIAGNOSIS — C4490 Unspecified malignant neoplasm of skin, unspecified: Secondary | ICD-10-CM | POA: Diagnosis not present

## 2022-11-19 DIAGNOSIS — D692 Other nonthrombocytopenic purpura: Secondary | ICD-10-CM | POA: Diagnosis not present

## 2022-11-19 DIAGNOSIS — D6869 Other thrombophilia: Secondary | ICD-10-CM | POA: Diagnosis not present

## 2022-11-19 DIAGNOSIS — Z95828 Presence of other vascular implants and grafts: Secondary | ICD-10-CM | POA: Diagnosis not present

## 2022-11-25 ENCOUNTER — Other Ambulatory Visit: Payer: Self-pay

## 2022-11-25 DIAGNOSIS — I739 Peripheral vascular disease, unspecified: Secondary | ICD-10-CM

## 2022-11-25 DIAGNOSIS — I70211 Atherosclerosis of native arteries of extremities with intermittent claudication, right leg: Secondary | ICD-10-CM

## 2022-12-04 DIAGNOSIS — T1511XA Foreign body in conjunctival sac, right eye, initial encounter: Secondary | ICD-10-CM | POA: Diagnosis not present

## 2022-12-06 ENCOUNTER — Encounter: Payer: Self-pay | Admitting: Vascular Surgery

## 2022-12-09 ENCOUNTER — Encounter (HOSPITAL_BASED_OUTPATIENT_CLINIC_OR_DEPARTMENT_OTHER): Payer: Self-pay

## 2022-12-09 DIAGNOSIS — D6859 Other primary thrombophilia: Secondary | ICD-10-CM

## 2022-12-09 DIAGNOSIS — M8660 Other chronic osteomyelitis, unspecified site: Secondary | ICD-10-CM | POA: Insufficient documentation

## 2022-12-09 DIAGNOSIS — N529 Male erectile dysfunction, unspecified: Secondary | ICD-10-CM | POA: Insufficient documentation

## 2022-12-09 DIAGNOSIS — D692 Other nonthrombocytopenic purpura: Secondary | ICD-10-CM | POA: Insufficient documentation

## 2022-12-09 DIAGNOSIS — I700 Atherosclerosis of aorta: Secondary | ICD-10-CM | POA: Insufficient documentation

## 2022-12-09 DIAGNOSIS — K4090 Unilateral inguinal hernia, without obstruction or gangrene, not specified as recurrent: Secondary | ICD-10-CM | POA: Insufficient documentation

## 2022-12-09 DIAGNOSIS — I7025 Atherosclerosis of native arteries of other extremities with ulceration: Secondary | ICD-10-CM

## 2022-12-09 DIAGNOSIS — C4490 Unspecified malignant neoplasm of skin, unspecified: Secondary | ICD-10-CM | POA: Insufficient documentation

## 2022-12-09 DIAGNOSIS — L98499 Non-pressure chronic ulcer of skin of other sites with unspecified severity: Secondary | ICD-10-CM | POA: Insufficient documentation

## 2022-12-09 DIAGNOSIS — R972 Elevated prostate specific antigen [PSA]: Secondary | ICD-10-CM | POA: Insufficient documentation

## 2022-12-09 DIAGNOSIS — Z95828 Presence of other vascular implants and grafts: Secondary | ICD-10-CM | POA: Insufficient documentation

## 2022-12-10 ENCOUNTER — Ambulatory Visit (INDEPENDENT_AMBULATORY_CARE_PROVIDER_SITE_OTHER): Payer: PPO | Admitting: Internal Medicine

## 2022-12-10 ENCOUNTER — Encounter (HOSPITAL_BASED_OUTPATIENT_CLINIC_OR_DEPARTMENT_OTHER): Payer: Self-pay | Admitting: Internal Medicine

## 2022-12-10 VITALS — BP 159/82 | HR 76 | Ht 72.0 in | Wt 166.2 lb

## 2022-12-10 DIAGNOSIS — E785 Hyperlipidemia, unspecified: Secondary | ICD-10-CM

## 2022-12-10 DIAGNOSIS — I779 Disorder of arteries and arterioles, unspecified: Secondary | ICD-10-CM | POA: Diagnosis not present

## 2022-12-10 DIAGNOSIS — I2510 Atherosclerotic heart disease of native coronary artery without angina pectoris: Secondary | ICD-10-CM

## 2022-12-10 DIAGNOSIS — I2584 Coronary atherosclerosis due to calcified coronary lesion: Secondary | ICD-10-CM

## 2022-12-10 DIAGNOSIS — L821 Other seborrheic keratosis: Secondary | ICD-10-CM | POA: Diagnosis not present

## 2022-12-10 DIAGNOSIS — C4441 Basal cell carcinoma of skin of scalp and neck: Secondary | ICD-10-CM | POA: Diagnosis not present

## 2022-12-10 DIAGNOSIS — E782 Mixed hyperlipidemia: Secondary | ICD-10-CM | POA: Diagnosis not present

## 2022-12-10 DIAGNOSIS — D485 Neoplasm of uncertain behavior of skin: Secondary | ICD-10-CM | POA: Diagnosis not present

## 2022-12-10 DIAGNOSIS — C44212 Basal cell carcinoma of skin of right ear and external auricular canal: Secondary | ICD-10-CM | POA: Diagnosis not present

## 2022-12-10 DIAGNOSIS — Z85828 Personal history of other malignant neoplasm of skin: Secondary | ICD-10-CM | POA: Diagnosis not present

## 2022-12-10 DIAGNOSIS — L812 Freckles: Secondary | ICD-10-CM | POA: Diagnosis not present

## 2022-12-10 DIAGNOSIS — Z85820 Personal history of malignant melanoma of skin: Secondary | ICD-10-CM | POA: Diagnosis not present

## 2022-12-10 DIAGNOSIS — L570 Actinic keratosis: Secondary | ICD-10-CM | POA: Diagnosis not present

## 2022-12-10 NOTE — Progress Notes (Signed)
LIPID CLINIC CONSULT NOTE  Chief Complaint:  Manage dyslipidemia  Primary Care Physician: Wyatt Ferretti, DO  Primary Cardiologist:  Wyatt Ishikawa, Jackson  HPI:  [name] is a 72 y.o. male who is being seen today for the evaluation of dyslipidemia at the request of Wyatt Ferretti, DO.  This is a pleasant 72 year old male kindly referred for evaluation management of dyslipidemia.  He has a history of PAD followed closely by [name] and previously by [name].  He had seen [name] in the interim in 2022.  He also has dyslipidemia on combination therapy with rosuvastatin and ezetimibe.  In addition to PAD he also has some mild coronary artery calcification.  He has had lower extremity bypass grafting and would be considered at a very high risk, therefore I would recommend a target LDL less than 55.  Recently he had been controlled with LDL less than 70 although has had some increasing cholesterol.  His most recent lipid profile showed total 152, triglycerides 102, HDL 44 and LDL 88.  Based on this his primary care provider suggested starting Repatha which she was actually prescribed for but has not yet taken, pending this visit with me today.  He has not previously had an LP(a) assessment.  PMHx:  Past Medical History:  Diagnosis Date   Claudication [facility])    Elevated PSA    Erectile dysfunction    Hyperlipidemia    Hyperlipidemia    PAD (peripheral artery disease) (HCC)     Past Surgical History:  Procedure Laterality Date   ABDOMINAL AORTOGRAM W/LOWER EXTREMITY [name] 09/11/2020   Procedure: ABDOMINAL AORTOGRAM W/LOWER EXTREMITY;  Surgeon: Wyatt Gess, Jackson;  [facility];  Service: Cardiovascular;  Laterality: [name];   ABDOMINAL AORTOGRAM W/LOWER EXTREMITY [name] 11/01/2020   Procedure: ABDOMINAL AORTOGRAM W/LOWER EXTREMITY;  Surgeon: [name];  [facility]: [facility];  Service: Cardiovascular;  Laterality: [name];   ABDOMINAL  AORTOGRAM W/LOWER EXTREMITY Right 12/28/2021   Procedure: ABDOMINAL AORTOGRAM W/LOWER EXTREMITY;  Surgeon: [name];  [facility]: [facility];  Service: Cardiovascular;  Laterality: Right;   COLONOSCOPY     x2   FEMORAL-TIBIAL BYPASS GRAFT Right 10/13/2020   Procedure: RIGHT COMMON FEMORAL ARTERY TO PERONEAL ARTERY BYPASS WITH INSITU GREATER SAPHENOUS VEIN;  Surgeon: [name];  Location: [facility] OR;  Service: Vascular;  Laterality: Right;   PERIPHERAL VASCULAR INTERVENTION Right 11/01/2020   Procedure: PERIPHERAL VASCULAR INTERVENTION;  Surgeon: [name];  [facility];  Service: Cardiovascular;  Laterality: Right;   PERIPHERAL VASCULAR INTERVENTION Right 12/28/2021   Procedure: PERIPHERAL VASCULAR INTERVENTION;  Surgeon: [name];  [facility];  Service: Cardiovascular;  Laterality: Right;  FEM/TIB BYPASS GRAFT   WITH THOMBECTOMY   TONSILLECTOMY     TONSILLECTOMY AND ADENOIDECTOMY      FAMHx:  Family History  Adopted: Yes    SOCHx:   reports that he quit smoking about 43 years ago. His smoking use included cigarettes. He has never been exposed to tobacco smoke. He has never used smokeless tobacco. He reports current alcohol use of about 4.0 standard drinks of alcohol per week. He reports that he does not use drugs.  ALLERGIES:  No Known Allergies  ROS: Pertinent items noted in HPI and remainder of comprehensive ROS otherwise negative.  HOME MEDS: Current Outpatient Medications on File Prior to Visit  Medication Sig Dispense Refill  aspirin EC 81 MG tablet Take 81 mg by mouth daily. Swallow whole.     ezetimibe (ZETIA) 10 MG tablet Take 1 tablet (10 mg total) by mouth daily. Pt needs yearly appointment for # 90 day supply.  Please call office to schedule appt. 30 tablet 0   REPATHA 140 MG/ML SOSY Inject 1 mL into the skin every 14 (fourteen) days.     rivaroxaban (XARELTO) 2.5 MG TABS tablet Take 1 tablet  (2.5 mg total) by mouth 2 (two) times daily. 60 tablet 11   rosuvastatin (CRESTOR) 40 MG tablet TAKE 1 TABLET BY MOUTH EVERY EVENING. 90 tablet 3   sildenafil (VIAGRA) 50 MG tablet Take 25 mg by mouth daily as needed for erectile dysfunction.     Current Facility-Administered Medications on File Prior to Visit  Medication Dose Route Frequency Provider Last Rate Last Admin   sodium chloride flush (NS) 0.9 % injection 3 mL  3 mL Intravenous Q12H Wyatt Gess, Jackson        LABS/IMAGING: No results found for this or any previous visit (from the past 48 hour(s)). No results found.  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   [name] has a mixed dyslipidemia and would be considered very high risk with prior vascular intervention.  He needs additional lipid lowering to target LDL less than 55 and he is also above target less than 70 at this point on high-dose rosuvastatin 40 mg daily and ezetimibe 10 mg daily.  Would recommend proceeding with Repatha as prescribed by his primary care provider.  He may very well have a high LP(a) which would help to tie all this vascular disease together.  I would recommend we assess that today and he start on the Repatha therapy with plan to repeat a lipid NMR in follow-up in 3 to 4 months.  Thanks again  for the kind referral.  Chrystie Nose, Jackson, [facility]  [place]  [facility]  Medical Director of the Advanced Lipid Disorders &  Cardiovascular Risk Reduction Clinic Diplomate of the American Board of Clinical Lipidology Attending Cardiologist  Direct Dial: [phone]  Fax: [phone]  Website:  [email or web address] 12/10/2022, 4:20 PM

## 2022-12-10 NOTE — Patient Instructions (Signed)
Medication Instructions:  START Repatha as prescribed by your PCP  *If you need a refill on your cardiac medications before your next appointment, please call your pharmacy*   Lab Work: LPa today   FASTING NMR lipoprofile to check cholesterol in 3-4 months   If you have labs (blood work) drawn today and your tests are completely normal, you will receive your results only by: MyChart Message (if you have MyChart) OR A paper copy in the mail If you have any lab test that is abnormal or we need to change your treatment, we will call you to review the results.   Testing/Procedures: NONE   Follow-Up: At [facility], you and your health needs are our priority.  As part of our continuing mission to provide you with exceptional heart care, we have created designated Provider Care Teams.  These Care Teams include your primary Cardiologist (physician) and Advanced Practice Providers (APPs -  Physician Assistants and Nurse Practitioners) who all work together to provide you with the care you need, when you need it.  We recommend signing up for the patient portal called "MyChart".  Sign up information is provided on this After Visit Summary.  MyChart is used to connect with patients for Virtual Visits (Telemedicine).  Patients are able to view lab/test results, encounter notes, upcoming appointments, etc.  Non-urgent messages can be sent to your provider as well.   To learn more about what you can do with MyChart, go to [email or web address].    Your next appointment:   3-4 months with Dr. Rennis Golden

## 2022-12-11 LAB — LIPOPROTEIN A (LPA): Lipoprotein (a): 192 nmol/L — ABNORMAL HIGH (ref <75.0)

## 2022-12-16 ENCOUNTER — Telehealth: Payer: Self-pay | Admitting: *Deleted

## 2022-12-16 NOTE — Telephone Encounter (Signed)
Patient called requesting to speak with Dr Lenell Antu. I explained that Dr Lenell Antu was out of the office. He's c/o right calf "cramping" at times and "possibly some discoloration"  I will discuss with Dr Lenell Antu int the morning for direction. Patient voiced understanding.

## 2022-12-18 ENCOUNTER — Other Ambulatory Visit: Payer: Self-pay | Admitting: *Deleted

## 2022-12-18 DIAGNOSIS — I739 Peripheral vascular disease, unspecified: Secondary | ICD-10-CM

## 2022-12-23 ENCOUNTER — Other Ambulatory Visit: Payer: Self-pay | Admitting: Cardiology

## 2022-12-24 DIAGNOSIS — Z85828 Personal history of other malignant neoplasm of skin: Secondary | ICD-10-CM | POA: Diagnosis not present

## 2022-12-24 DIAGNOSIS — C44212 Basal cell carcinoma of skin of right ear and external auricular canal: Secondary | ICD-10-CM | POA: Diagnosis not present

## 2022-12-26 ENCOUNTER — Ambulatory Visit (HOSPITAL_COMMUNITY)
Admission: RE | Admit: 2022-12-26 | Discharge: 2022-12-26 | Disposition: A | Discharge status: Home / Self Care | Payer: PPO | Source: Ambulatory Visit | Attending: Vascular Surgery | Admitting: Vascular Surgery

## 2022-12-26 DIAGNOSIS — I739 Peripheral vascular disease, unspecified: Secondary | ICD-10-CM

## 2022-12-26 LAB — VAS US ABI WITH/WO TBI
Left ABI: 1.22
Right ABI: 0.43

## 2022-12-30 NOTE — Progress Notes (Unsigned)
[facility] [place]  ASSESSMENT / PLAN: [name] is a 72 y.o. male with history of right femoral-peroneal bypass 10/13/20 for ischemic ulceration of right foot. 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 Clinical exam and non-invasive testing reassuring today.   Recommend::  Complete cessation from all tobacco products. Blood glucose control with goal A1c < 7%. Blood pressure control with goal blood pressure < 140/90 mmHg. Lipid reduction therapy with goal LDL-C <100 mg/dL (<46 if symptomatic from PAD).  Aspirin 81mg  PO QD.  Rivaroxaban 2.5mg  PO BID. Atorvastatin 40-80mg  PO QD (or other "high intensity" statin therapy).  Follow up with me in 6 months with ABI / RLE arterial duplex.  CHIEF COMPLAINT: follow up  HISTORY OF PRESENT ILLNESS: [name] is a 72 y.o. male very well known to me with complex peripheral vascular history. He initially presented to care in Spring 2022 with disabling claudication. A cardiologist had previously informed him "nothing could be done" for his leg. He sought an opinion from me. A trial of watchful waiting was performed, but he returned to care with ischemic ulceration of the great toe about a month later. He underwent right femoral - peroneal bypass which he recovered from well. He healed his foot rapidly. He returned to exercise quickly. He had significant swelling in his leg postoperatively. Angiography revealed fistulae which were treated with coil embolization. He then progressed a proximal stenotic lesion and had low velocities in the graft. This was treated with angioplasty and stenting with good result.  He presents today for surveillance.  He is doing very well.  He is exercising is much as he likes.  He has some very mild calf cramping when walking uphill for a very long distance but otherwise feels great.  VASCULAR SURGICAL HISTORY:  10/13/20:  right common femoral to peroneal artery bypass with in-situ greater saphenous vein  11/01/20: Coil embolization of arteriovenous fistula x 2 in R fem-pop bypass (6mm Nestor coils x 4 total, 2 in each fistula)  12/28/21: Right femoral-peroneal bypass angioplasty and stenting (7x37mm Viabahn)   VASCULAR RISK FACTORS: Negative history of stroke / transient ischemic attack. Negative history of coronary artery disease.  Negative history of diabetes mellitus.  Negative history of smoking.  Positive history of hypertension.  Negative history of chronic kidney disease.   Negative history of chronic obstructive pulmonary disease.  FUNCTIONAL STATUS: ECOG performance status: (0) Fully active, able to carry on all predisease performance without restriction Ambulatory status: Ambulatory within the community without limits  Past Medical History:  Diagnosis Date   Claudication [facility])    Elevated PSA    Erectile dysfunction    Hyperlipidemia    Hyperlipidemia    PAD (peripheral artery disease) (HCC)     Past Surgical History:  Procedure Laterality Date   ABDOMINAL AORTOGRAM W/LOWER EXTREMITY [name] 09/11/2020   Procedure: ABDOMINAL AORTOGRAM W/LOWER EXTREMITY;  Surgeon: Runell Gess, MD;  [facility];  Service: Cardiovascular;  Laterality: [name];   ABDOMINAL AORTOGRAM W/LOWER EXTREMITY [name] 11/01/2020   Procedure: ABDOMINAL AORTOGRAM W/LOWER EXTREMITY;  Surgeon: Leonie Douglas, MD;  [facility];  Service: Cardiovascular;  Laterality: [name];   ABDOMINAL AORTOGRAM W/LOWER EXTREMITY Right 12/28/2021   Procedure: ABDOMINAL AORTOGRAM W/LOWER EXTREMITY;  Surgeon: Leonie Douglas, MD;  [facility];  Service: Cardiovascular;  Laterality: Right;   COLONOSCOPY     x2  FEMORAL-TIBIAL BYPASS GRAFT Right 10/13/2020   Procedure: RIGHT COMMON FEMORAL ARTERY TO PERONEAL ARTERY BYPASS WITH INSITU GREATER SAPHENOUS VEIN;  Surgeon: Leonie Douglas, MD;  Location: [facility]  OR;  Service: Vascular;  Laterality: Right;   PERIPHERAL VASCULAR INTERVENTION Right 11/01/2020   Procedure: PERIPHERAL VASCULAR INTERVENTION;  Surgeon: Leonie Douglas, MD;  [facility];  Service: Cardiovascular;  Laterality: Right;   PERIPHERAL VASCULAR INTERVENTION Right 12/28/2021   Procedure: PERIPHERAL VASCULAR INTERVENTION;  Surgeon: Leonie Douglas, MD;  [facility];  Service: Cardiovascular;  Laterality: Right;  FEM/TIB BYPASS GRAFT   WITH THOMBECTOMY   TONSILLECTOMY     TONSILLECTOMY AND ADENOIDECTOMY      Family History  Adopted: Yes    Social History   Socioeconomic History   Marital status: Married    Spouse name: [name]   Number of children: [name]   Years of education: [name]   Highest education level: [name]  Occupational History   [name]  Tobacco Use   Smoking status: Former    Types: Cigarettes    Quit date: 45    Years since quitting: 43.5    Passive exposure: Never   Smokeless tobacco: Never  Vaping Use   Vaping Use: Never used  Substance and Sexual Activity   Alcohol use: Yes    Alcohol/week: 4.0 standard drinks of alcohol    Types: 2 Cans of beer, 2 Shots of liquor per week    Comment: socially   Drug use: Never   Sexual activity: [name]  Other Topics Concern   [name]  Social History Narrative   [name]   Social Determinants of Health   Financial Resource Strain: [name]  Food Insecurity: No Food Insecurity (12/10/2022)   Hunger Vital Sign    Worried About Running Out of Food in the Last Year: Never true    Ran Out of Food in the Last Year: Never true  Transportation Needs: No Transportation Needs (12/10/2022)   PRAPARE - Administrator, Civil Service (Medical): No    Lack of Transportation (Non-Medical): No  Physical Activity: Sufficiently Active (12/10/2022)   Exercise Vital Sign    Days of Exercise per Week: 4 days    Minutes of Exercise per Session:  60 min  Stress: [name]  Social Connections: [name]  Intimate Partner Violence: [name]    No Known Allergies  Current Outpatient Medications  Medication Sig Dispense Refill   aspirin EC 81 MG tablet Take 81 mg by mouth daily. Swallow whole.     ezetimibe (ZETIA) 10 MG tablet Take 1 tablet (10 mg total) by mouth daily. 90 tablet 3   REPATHA 140 MG/ML SOSY Inject 1 mL into the skin every 14 (fourteen) days.     rivaroxaban (XARELTO) 2.5 MG TABS tablet Take 1 tablet (2.5 mg total) by mouth 2 (two) times daily. 60 tablet 11   rosuvastatin (CRESTOR) 40 MG tablet TAKE 1 TABLET BY MOUTH EVERY EVENING. 90 tablet 3   sildenafil (VIAGRA) 50 MG tablet Take 25 mg by mouth daily as needed for erectile dysfunction.     Current Facility-Administered Medications  Medication Dose Route Frequency Provider Last Rate Last Admin   sodium chloride flush (NS) 0.9 % injection 3 mL  3 mL Intravenous Q12H Runell Gess, MD        PHYSICAL  EXAM There were no vitals filed for this visit.  [name].  He appears younger than his stated age. Right leg well healed and warm.   PERTINENT LABORATORY AND RADIOLOGIC DATA  Most recent CBC    Latest Ref Rng & Units 12/28/2021    5:53 AM 04/24/2021    8:55 AM 03/23/2021    2:09 PM  CBC  WBC 3.8 - 10.8 Thousand/uL  7.1  7.3   Hemoglobin 13.0 - 17.0 g/dL 86.5  78.4  69.6   Hematocrit 39.0 - 52.0 % 45.0  46.4  45.1   Platelets 140 - 400 Thousand/uL  223  227      Most recent CMP    Latest Ref Rng & Units 12/28/2021    5:53 AM 04/24/2021    8:55 AM 03/23/2021    2:09 PM  CMP  Glucose 70 - 99 mg/dL 295  85  94   BUN 8 - 23 mg/dL 23  21  18    Creatinine 0.61 - 1.24 mg/dL 2.84  1.32  4.40   Sodium 135 - 145 mmol/L 139  143  141   Potassium 3.5 - 5.1 mmol/L 3.9  5.1  4.3   Chloride 98 - 111 mmol/L 99  107  105   CO2 20 - 32 mmol/L  30  33   Calcium 8.6 - 10.3 mg/dL  9.1  9.0     Renal function CrCl  cannot be calculated (Patient's most recent lab result is older than the maximum 21 days allowed.).  No results found for: "HGBA1C"  LDL Chol Calc (NIH)  Date Value Ref Range Status  01/02/2021 70 0 - 99 mg/dL Final     +-------+-----------+-----------+------------+------------+  ABI/TBIToday's ABIToday's TBIPrevious ABIPrevious TBI  +-------+-----------+-----------+------------+------------+  Right 1.24       0.57       1.10        0.64          +-------+-----------+-----------+------------+------------+  Left  1.45       0.91       1.26        1.01          +-------+-----------+-----------+------------+------------+   Arterial duplex shows good flow, no stenosis in bypass graft  Rande Brunt. Lenell Antu, MD [facility]  [facility] Phone Number: [phone] 12/30/2022 8:18 PM   Total time spent on preparing this encounter including chart review, data review, collecting history, examining the patient, coordinating care for this established patient, 20 minutes.  Portions of this report may have been transcribed using voice recognition software.  Every effort has been made to ensure accuracy; however, inadvertent computerized transcription errors may still be present.

## 2022-12-31 ENCOUNTER — Encounter: Payer: Self-pay | Admitting: Vascular Surgery

## 2022-12-31 ENCOUNTER — Ambulatory Visit (INDEPENDENT_AMBULATORY_CARE_PROVIDER_SITE_OTHER): Payer: PPO | Admitting: Vascular Surgery

## 2022-12-31 VITALS — BP 130/72 | HR 64 | Temp 98.2°F | Resp 20 | Ht 72.0 in | Wt 163.0 lb

## 2022-12-31 DIAGNOSIS — I739 Peripheral vascular disease, unspecified: Secondary | ICD-10-CM

## 2023-01-14 ENCOUNTER — Other Ambulatory Visit: Payer: Self-pay | Admitting: Vascular Surgery

## 2023-01-27 DIAGNOSIS — L570 Actinic keratosis: Secondary | ICD-10-CM | POA: Diagnosis not present

## 2023-02-11 DIAGNOSIS — L570 Actinic keratosis: Secondary | ICD-10-CM | POA: Diagnosis not present

## 2023-02-11 DIAGNOSIS — Z85828 Personal history of other malignant neoplasm of skin: Secondary | ICD-10-CM | POA: Diagnosis not present

## 2023-02-20 ENCOUNTER — Encounter: Payer: Self-pay | Admitting: Vascular Surgery

## 2023-02-21 ENCOUNTER — Encounter: Payer: Self-pay | Admitting: Vascular Surgery

## 2023-03-03 DIAGNOSIS — H524 Presbyopia: Secondary | ICD-10-CM | POA: Diagnosis not present

## 2023-03-03 DIAGNOSIS — H5203 Hypermetropia, bilateral: Secondary | ICD-10-CM | POA: Diagnosis not present

## 2023-03-03 DIAGNOSIS — H2513 Age-related nuclear cataract, bilateral: Secondary | ICD-10-CM | POA: Diagnosis not present

## 2023-03-30 ENCOUNTER — Encounter: Payer: Self-pay | Admitting: Vascular Surgery

## 2023-04-01 DIAGNOSIS — H10502 Unspecified blepharoconjunctivitis, left eye: Secondary | ICD-10-CM | POA: Diagnosis not present

## 2023-04-07 NOTE — Progress Notes (Unsigned)
[facility] [place]  ASSESSMENT / PLAN: [name] is a 72 y.o. male with history of right femoral-peroneal bypass 10/13/20 for ischemic ulceration of right foot. 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  Clinical exam and noninvasive testing worrisome for thrombosed bypass  Recommend: Complete cessation from all tobacco products. Blood glucose control with goal A1c < 7%. Blood pressure control with goal blood pressure < 140/90 mmHg. Lipid reduction therapy with goal LDL-C <70 mg/dL  Aspirin 81mg  PO QD.  Rivaroxaban 2.5mg  PO BID. Atorvastatin 40-80mg  PO QD (or other "high intensity" statin therapy).  The patient describes fairly long distance claudication.  His ankle-brachial index has dropped significantly.  I am not able to find a reliable Doppler signal in his pedal vessels today.  His foot is warm with normal capillary refill.  I reviewed options with him and his wife today in detail.  Given his symptoms have been greater than 4 weeks in duration; and given the fairly mild symptoms he is having, I did not recommend angiography with possible thrombolysis given its associated risks of bleeding, and the lower chance of success given the time course of events.  I reviewed this plan with one of my partners, who is in agreement.  We will take a watchful waiting approach.  I am hopeful that he will be able to exercise through his symptoms and keep himself out of limb-threatening ischemia.  I will see him back in 3 months.  I counseled him about symptoms of limb threatening ischemia.  He knows to present sooner should these develop.  CHIEF COMPLAINT: follow up  HISTORY OF PRESENT ILLNESS: [name] is a 72 y.o. male very well known to me with complex peripheral vascular history. He initially presented to care in Spring 2022 with disabling claudication. A cardiologist had previously informed  him "nothing could be done" for his leg. He sought an opinion from me. A trial of watchful waiting was performed, but he returned to care with ischemic ulceration of the great toe about a month later. He underwent right femoral - peroneal bypass which he recovered from well. He healed his foot rapidly. He returned to exercise quickly. He had significant swelling in his leg postoperatively. Angiography revealed fistulae which were treated with coil embolization. He then progressed a proximal stenotic lesion and had low velocities in the graft. This was treated with angioplasty and stenting with good result.  He presents today for surveillance.  He is doing very well.  He is exercising is much as he likes.  He has some very mild calf cramping when walking uphill for a very long distance but otherwise feels great.  12/31/22: Clinic to report cramping discomfort in his right lower extremity has returned.  He has been able to continue exercising at the gym.  He has noticed cramping when walking uphill or for long distances.  He has noticed some discoloration in his right foot as well.  We reviewed his noninvasive testing and clinical exam results [place].  His wife is with him today.  VASCULAR SURGICAL HISTORY:  10/13/20: right common femoral to peroneal artery bypass with in-situ greater saphenous vein  11/01/20: Coil embolization of arteriovenous fistula x 2 in R fem-pop bypass (6mm Nestor coils x 4 total, 2 in each fistula)  12/28/21: Right femoral-peroneal bypass angioplasty and stenting (7x47mm Viabahn)   VASCULAR RISK FACTORS: Negative history of stroke / transient ischemic attack.  Negative history of coronary artery disease.  Negative history of diabetes mellitus.  Negative history of smoking.  Positive history of hypertension.  Negative history of chronic kidney disease.   Negative history of chronic obstructive pulmonary disease.  FUNCTIONAL STATUS: ECOG performance status: (0) Fully active, able  to carry on all predisease performance without restriction Ambulatory status: Ambulatory within the community without limits  Past Medical History:  Diagnosis Date   Claudication [facility])    Elevated PSA    Erectile dysfunction    Hyperlipidemia    Hyperlipidemia    PAD (peripheral artery disease) (HCC)     Past Surgical History:  Procedure Laterality Date   ABDOMINAL AORTOGRAM W/LOWER EXTREMITY [name] 09/11/2020   Procedure: ABDOMINAL AORTOGRAM W/LOWER EXTREMITY;  Surgeon: Runell Gess, MD;  [facility];  Service: Cardiovascular;  Laterality: [name];   ABDOMINAL AORTOGRAM W/LOWER EXTREMITY [name] 11/01/2020   Procedure: ABDOMINAL AORTOGRAM W/LOWER EXTREMITY;  Surgeon: Leonie Douglas, MD;  Location: [facility];  Service: Cardiovascular;  Laterality: [name];   ABDOMINAL AORTOGRAM W/LOWER EXTREMITY Right 12/28/2021   Procedure: ABDOMINAL AORTOGRAM W/LOWER EXTREMITY;  Surgeon: Leonie Douglas, MD;  Location: [facility];  Service: Cardiovascular;  Laterality: Right;   COLONOSCOPY     x2   FEMORAL-TIBIAL BYPASS GRAFT Right 10/13/2020   Procedure: RIGHT COMMON FEMORAL ARTERY TO PERONEAL ARTERY BYPASS WITH INSITU GREATER SAPHENOUS VEIN;  Surgeon: Leonie Douglas, MD;  Location: [facility] OR;  Service: Vascular;  Laterality: Right;   PERIPHERAL VASCULAR INTERVENTION Right 11/01/2020   Procedure: PERIPHERAL VASCULAR INTERVENTION;  Surgeon: Leonie Douglas, MD;  [facility];  Service: Cardiovascular;  Laterality: Right;   PERIPHERAL VASCULAR INTERVENTION Right 12/28/2021   Procedure: PERIPHERAL VASCULAR INTERVENTION;  Surgeon: Leonie Douglas, MD;  [facility];  Service: Cardiovascular;  Laterality: Right;  FEM/TIB BYPASS GRAFT   WITH THOMBECTOMY   TONSILLECTOMY     TONSILLECTOMY AND ADENOIDECTOMY      Family History  Adopted: Yes    Social History   Socioeconomic History   Marital status: Married    Spouse name: [name]   Number of  children: [name]   Years of education: [name]   Highest education level: [name]  Occupational History   [name]  Tobacco Use   Smoking status: Former    Current packs/day: 0.00    Types: Cigarettes    Quit date: 61    Years since quitting: 43.7    Passive exposure: Never   Smokeless tobacco: Never  Vaping Use   Vaping status: Never Used  Substance and Sexual Activity   Alcohol use: Yes    Alcohol/week: 4.0 standard drinks of alcohol    Types: 2 Cans of beer, 2 Shots of liquor per week    Comment: socially   Drug use: Never   Sexual activity: [name]  Other Topics Concern   [name]  Social History Narrative   [name]   Social Determinants of Health   Financial Resource Strain: [name]  Food Insecurity: No Food Insecurity (12/10/2022)   Hunger Vital Sign    Worried About Running Out of Food in the Last Year: Never true    Ran Out of Food in the Last Year: Never true  Transportation Needs: No Transportation Needs (12/10/2022)   PRAPARE - Administrator, Civil Service (Medical): No  Lack of Transportation (Non-Medical): No  Physical Activity: Sufficiently Active (12/10/2022)   Exercise Vital Sign    Days of Exercise per Week: 4 days    Minutes of Exercise per Session: 60 min  Stress: [name]  Social Connections: [name]  Intimate Partner Violence: [name]    No Known Allergies  Current Outpatient Medications  Medication Sig Dispense Refill   aspirin EC 81 MG tablet Take 81 mg by mouth daily. Swallow whole.     ezetimibe (ZETIA) 10 MG tablet Take 1 tablet (10 mg total) by mouth daily. 90 tablet 3   REPATHA 140 MG/ML SOSY Inject 1 mL into the skin every 14 (fourteen) days.     rosuvastatin (CRESTOR) 40 MG tablet TAKE 1 TABLET BY MOUTH EVERY EVENING. 90 tablet 3   sildenafil (VIAGRA) 50 MG tablet Take 25 mg by mouth daily as needed for erectile dysfunction.     XARELTO 2.5 MG TABS tablet TAKE 1 TABLET (2.5 MG  TOTAL) BY MOUTH 2 (TWO) TIMES DAILY. 60 tablet 11   Current Facility-Administered Medications  Medication Dose Route Frequency Provider Last Rate Last Admin   sodium chloride flush (NS) 0.9 % injection 3 mL  3 mL Intravenous Q12H Runell Gess, MD        PHYSICAL EXAM There were no vitals filed for this visit.  [name].  He appears younger than his stated age. Right leg well healed and warm.   PERTINENT LABORATORY AND RADIOLOGIC DATA  Most recent CBC    Latest Ref Rng & Units 12/28/2021    5:53 AM 04/24/2021    8:55 AM 03/23/2021    2:09 PM  CBC  WBC 3.8 - 10.8 Thousand/uL  7.1  7.3   Hemoglobin 13.0 - 17.0 g/dL 16.1  09.6  04.5   Hematocrit 39.0 - 52.0 % 45.0  46.4  45.1   Platelets 140 - 400 Thousand/uL  223  227      Most recent CMP    Latest Ref Rng & Units 12/28/2021    5:53 AM 04/24/2021    8:55 AM 03/23/2021    2:09 PM  CMP  Glucose 70 - 99 mg/dL 409  85  94   BUN 8 - 23 mg/dL 23  21  18    Creatinine 0.61 - 1.24 mg/dL 8.11  9.14  7.82   Sodium 135 - 145 mmol/L 139  143  141   Potassium 3.5 - 5.1 mmol/L 3.9  5.1  4.3   Chloride 98 - 111 mmol/L 99  107  105   CO2 20 - 32 mmol/L  30  33   Calcium 8.6 - 10.3 mg/dL  9.1  9.0     Renal function CrCl cannot be calculated (Patient's most recent lab result is older than the maximum 21 days allowed.).  No results found for: "HGBA1C"  LDL Chol Calc (NIH)  Date Value Ref Range Status  01/02/2021 70 0 - 99 mg/dL Final     +-------+-----------+-----------+------------+------------+  ABI/TBIToday's ABIToday's TBIPrevious ABIPrevious TBI  +-------+-----------+-----------+------------+------------+  Right 0.43       absent     1.24        0.57          +-------+-----------+-----------+------------+------------+  Left  1.22       1.05       1.45        0.91          +-------+-----------+-----------+------------+------------+  Rande Brunt. Lenell Antu, MD  FACS [facility] Phone Number: [phone] 04/07/2023 7:54 PM   Total time spent on preparing this encounter including chart review, data review, collecting history, examining the patient, coordinating care for this established patient, 30 minutes.  Portions of this report may have been transcribed using voice recognition software.  Every effort has been made to ensure accuracy; however, inadvertent computerized transcription errors may still be present.

## 2023-04-08 ENCOUNTER — Ambulatory Visit (HOSPITAL_COMMUNITY)
Admission: RE | Admit: 2023-04-08 | Discharge: 2023-04-08 | Disposition: A | Discharge status: Home / Self Care | Payer: PPO | Source: Ambulatory Visit | Attending: Vascular Surgery | Admitting: Vascular Surgery

## 2023-04-08 ENCOUNTER — Ambulatory Visit: Payer: PPO | Admitting: Vascular Surgery

## 2023-04-08 ENCOUNTER — Encounter: Payer: Self-pay | Admitting: Vascular Surgery

## 2023-04-08 VITALS — BP 142/87 | HR 91 | Temp 98.4°F | Resp 20 | Ht 72.0 in | Wt 160.0 lb

## 2023-04-08 DIAGNOSIS — I739 Peripheral vascular disease, unspecified: Secondary | ICD-10-CM | POA: Diagnosis not present

## 2023-04-08 DIAGNOSIS — I70211 Atherosclerosis of native arteries of extremities with intermittent claudication, right leg: Secondary | ICD-10-CM | POA: Insufficient documentation

## 2023-04-08 LAB — VAS US ABI WITH/WO TBI
Left ABI: 1.16
Right ABI: ABSENT

## 2023-04-09 LAB — NMR, LIPOPROFILE

## 2023-04-11 ENCOUNTER — Encounter: Payer: Self-pay | Admitting: Internal Medicine

## 2023-04-11 ENCOUNTER — Ambulatory Visit: Payer: PPO | Attending: Internal Medicine | Admitting: Internal Medicine

## 2023-04-11 VITALS — BP 120/76 | HR 61 | Ht 72.0 in | Wt 161.2 lb

## 2023-04-11 DIAGNOSIS — E7841 Elevated Lipoprotein(a): Secondary | ICD-10-CM | POA: Diagnosis not present

## 2023-04-11 DIAGNOSIS — I2510 Atherosclerotic heart disease of native coronary artery without angina pectoris: Secondary | ICD-10-CM

## 2023-04-11 DIAGNOSIS — E785 Hyperlipidemia, unspecified: Secondary | ICD-10-CM

## 2023-04-11 DIAGNOSIS — I779 Disorder of arteries and arterioles, unspecified: Secondary | ICD-10-CM

## 2023-04-11 NOTE — Progress Notes (Signed)
LIPID CLINIC CONSULT NOTE  Chief Complaint:  Manage dyslipidemia  Primary Care Physician: Wyatt Ferretti, DO  Primary Cardiologist:  Wyatt Ishikawa, MD  HPI:  [name] is a 72 y.o. male who is being seen today for the evaluation of dyslipidemia at the request of Wyatt Ferretti, DO.  This is a pleasant 72 year old male kindly referred for evaluation management of dyslipidemia.  He has a history of PAD followed closely by [name] and previously by Dr. Kendal Jackson.  He had seen Dr. Bjorn Jackson in the interim in 2022.  He also has dyslipidemia on combination therapy with rosuvastatin and ezetimibe.  In addition to PAD he also has some mild coronary artery calcification.  He has had lower extremity bypass grafting and would be considered at a very high risk, therefore I would recommend a target LDL less than 55.  Recently he had been controlled with LDL less than 70 although has had some increasing cholesterol.  His most recent lipid profile showed total 152, triglycerides 102, HDL 44 and LDL 88.  Based on this his primary care provider suggested starting Repatha which she was actually prescribed for but has not yet taken, pending this visit with me today.  He has not previously had an LP(a) assessment.  04/11/2023  [name] returns today for follow-up.  He has been on Repatha as his LDL was slightly above 70 and still above target LDL less than 55 for aggressive therapy given coronary calcification and PAOD.  He reports no issues with the medication.  He recently had labs and I was interested particularly in whether he had an elevated LP(a).  In fact he does, with an LP(a) of 192 nmol/L, probably this was 30% higher as we typically see that degree of reduction on Repatha.  Unfortunately his lipid NMR was not performed due to contaminant.  He will need to have the sample redrawn.  PMHx:  Past Medical History:  Diagnosis Date   Claudication [facility])    Elevated PSA    Erectile  dysfunction    Hyperlipidemia    Hyperlipidemia    PAD (peripheral artery disease) (HCC)     Past Surgical History:  Procedure Laterality Date   ABDOMINAL AORTOGRAM W/LOWER EXTREMITY [name] 09/11/2020   Procedure: ABDOMINAL AORTOGRAM W/LOWER EXTREMITY;  Surgeon: Wyatt Gess, MD;  [facility];  Service: Cardiovascular;  Laterality: [name];   ABDOMINAL AORTOGRAM W/LOWER EXTREMITY [name] 11/01/2020   Procedure: ABDOMINAL AORTOGRAM W/LOWER EXTREMITY;  Surgeon: Wyatt Douglas, MD;  [facility];  Service: Cardiovascular;  Laterality: [name];   ABDOMINAL AORTOGRAM W/LOWER EXTREMITY Right 12/28/2021   Procedure: ABDOMINAL AORTOGRAM W/LOWER EXTREMITY;  Surgeon: Wyatt Douglas, MD;  [facility];  Service: Cardiovascular;  Laterality: Right;   COLONOSCOPY     x2   FEMORAL-TIBIAL BYPASS GRAFT Right 10/13/2020   Procedure: RIGHT COMMON FEMORAL ARTERY TO PERONEAL ARTERY BYPASS WITH INSITU GREATER SAPHENOUS VEIN;  Surgeon: Wyatt Douglas, MD;  Location: [facility] OR;  Service: Vascular;  Laterality: Right;   PERIPHERAL VASCULAR INTERVENTION Right 11/01/2020   Procedure: PERIPHERAL VASCULAR INTERVENTION;  Surgeon: Wyatt Douglas, MD;  [facility];  Service: Cardiovascular;  Laterality: Right;   PERIPHERAL VASCULAR INTERVENTION Right 12/28/2021   Procedure: PERIPHERAL VASCULAR INTERVENTION;  Surgeon: Wyatt Douglas, MD;  [facility];  Service: Cardiovascular;  Laterality: Right;  FEM/TIB BYPASS GRAFT   WITH THOMBECTOMY   TONSILLECTOMY  TONSILLECTOMY AND ADENOIDECTOMY      FAMHx:  Family History  Adopted: Yes    SOCHx:   reports that he quit smoking about 43 years ago. His smoking use included cigarettes. He has never been exposed to tobacco smoke. He has never used smokeless tobacco. He reports current alcohol use of about 4.0 standard drinks of alcohol per week. He reports that he does not use drugs.  ALLERGIES:  No Known  Allergies  ROS: Pertinent items noted in HPI and remainder of comprehensive ROS otherwise negative.  HOME MEDS: Current Outpatient Medications on File Prior to Visit  Medication Sig Dispense Refill   aspirin EC 81 MG tablet Take 81 mg by mouth daily. Swallow whole.     ezetimibe (ZETIA) 10 MG tablet Take 1 tablet (10 mg total) by mouth daily. 90 tablet 3   neomycin-polymyxin b-dexamethasone (MAXITROL) 3.5-10000-0.1 SUSP Place 1 drop into the left eye daily.     REPATHA 140 MG/ML SOSY Inject 1 mL into the skin every 14 (fourteen) days.     rosuvastatin (CRESTOR) 40 MG tablet TAKE 1 TABLET BY MOUTH EVERY EVENING. 90 tablet 3   XARELTO 2.5 MG TABS tablet TAKE 1 TABLET (2.5 MG TOTAL) BY MOUTH 2 (TWO) TIMES DAILY. 60 tablet 11   Current Facility-Administered Medications on File Prior to Visit  Medication Dose Route Frequency Provider Last Rate Last Admin   sodium chloride flush (NS) 0.9 % injection 3 mL  3 mL Intravenous Q12H Wyatt Gess, MD        LABS/IMAGING: No results found for this or any previous visit (from the past 48 hour(s)). No results found.  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   [name] has done well with Repatha in addition to rosuvastatin and ezetimibe.  His LP(a) is elevated at 192 nmol/L.  Likely it was higher than this previously.  I suspect he  would be at target now although his sample was not able to be run.  Will repeat his lipids today as he is fasting.  Plan likely follow-up annually or sooner as necessary.  Wyatt Nose, MD, [facility], FACP  [place]  [facility]  Medical Director of the Advanced Lipid Disorders &  Cardiovascular Risk Reduction Clinic Diplomate of the American Board of Clinical Lipidology Attending Cardiologist  Direct Dial: [phone]  Fax: [phone]  Website:  [email or web address]  Lisette Abu Parag Dorton 04/11/2023, 8:25 AM

## 2023-04-11 NOTE — Patient Instructions (Signed)
Medication Instructions:  NO CHANGES  *If you need a refill on your cardiac medications before your next appointment, please call your pharmacy*   Lab Work: NMR lipoprofile today  If you have labs (blood work) drawn today and your tests are completely normal, you will receive your results only by: MyChart Message (if you have MyChart) OR A paper copy in the mail If you have any lab test that is abnormal or we need to change your treatment, we will call you to review the results.   Follow-Up: At [facility], you and your health needs are our priority.  As part of our continuing mission to provide you with exceptional heart care, we have created designated Provider Care Teams.  These Care Teams include your primary Cardiologist (physician) and Advanced Practice Providers (APPs -  Physician Assistants and Nurse Practitioners) who all work together to provide you with the care you need, when you need it.  We recommend signing up for the patient portal called "MyChart".  Sign up information is provided on this After Visit Summary.  MyChart is used to connect with patients for Virtual Visits (Telemedicine).  Patients are able to view lab/test results, encounter notes, upcoming appointments, etc.  Non-urgent messages can be sent to your provider as well.   To learn more about what you can do with MyChart, go to [email or web address].    Your next appointment:   12 months with Dr. Rennis Golden -- lipid clinic

## 2023-04-15 LAB — NMR, LIPOPROFILE
Cholesterol, Total: 108 mg/dL (ref 100–199)
HDL Particle Number: 40.1 umol/L (ref >=30.5)
HDL-C: 76 mg/dL (ref >39)
LDL Particle Number: <300 nmol/L (ref <1000)
LDL-C (NIH Calc): 18 mg/dL (ref 0–99)
LP-IR Score: <25 (ref <=45)
Small LDL Particle Number: 93 nmol/L (ref <=527)
Triglycerides: 61 mg/dL (ref 0–149)

## 2023-04-16 ENCOUNTER — Encounter: Payer: Self-pay | Admitting: Vascular Surgery

## 2023-04-18 ENCOUNTER — Ambulatory Visit: Payer: PPO | Admitting: Podiatry

## 2023-04-18 ENCOUNTER — Other Ambulatory Visit: Payer: Self-pay | Admitting: Cardiology

## 2023-04-18 ENCOUNTER — Encounter: Payer: Self-pay | Admitting: Podiatry

## 2023-04-18 DIAGNOSIS — N401 Enlarged prostate with lower urinary tract symptoms: Secondary | ICD-10-CM | POA: Diagnosis not present

## 2023-04-18 DIAGNOSIS — L03031 Cellulitis of right toe: Secondary | ICD-10-CM

## 2023-04-18 NOTE — Patient Instructions (Signed)

## 2023-04-21 NOTE — Progress Notes (Signed)
Subjective:   Patient ID: [name], male   DOB: 72 y.o.   MRN: [id number]   HPI Patient presents stating has a lot of redness and irritation in the right hallux nail and patient has had issues with circulation and is referred by vascular physician.  Patient does not currently smoke tries to be active   Review of Systems  All other systems reviewed and are negative.       Objective:  Physical Exam Vitals and nursing note reviewed.  Constitutional:      Appearance: He is well-developed.  Pulmonary:     Effort: Pulmonary effort is normal.  Musculoskeletal:        General: Normal range of motion.  Skin:    General: Skin is warm.  Neurological:     Mental Status: He is alert.     Neurological intact with diminishment of pulses DP PT right over left with patient found to have irritated corners of the right big toenail incurvated in the beds and slight redness no proximal edema edema drainage noted     Assessment:  Paronychia infection right hallux with vascular pathology is complicating factor     Plan:  H&P done anesthetized the right big toe carefully removed the nail borders removed necrotic tissue and applied sterile dressing with instructions for soaks and that patient does have circulatory disease they were not able to do anything about currently.  All questions answered will reason appoint to be seen back

## 2023-04-25 ENCOUNTER — Other Ambulatory Visit: Payer: Self-pay

## 2023-04-25 DIAGNOSIS — I739 Peripheral vascular disease, unspecified: Secondary | ICD-10-CM

## 2023-04-28 ENCOUNTER — Ambulatory Visit: Payer: PPO | Admitting: Podiatry

## 2023-05-05 ENCOUNTER — Encounter: Payer: Self-pay | Admitting: Vascular Surgery

## 2023-05-08 DIAGNOSIS — N401 Enlarged prostate with lower urinary tract symptoms: Secondary | ICD-10-CM | POA: Diagnosis not present

## 2023-05-08 DIAGNOSIS — R351 Nocturia: Secondary | ICD-10-CM | POA: Diagnosis not present

## 2023-05-08 DIAGNOSIS — N5201 Erectile dysfunction due to arterial insufficiency: Secondary | ICD-10-CM | POA: Diagnosis not present

## 2023-05-08 DIAGNOSIS — R972 Elevated prostate specific antigen [PSA]: Secondary | ICD-10-CM | POA: Diagnosis not present

## 2023-05-12 DIAGNOSIS — K4020 Bilateral inguinal hernia, without obstruction or gangrene, not specified as recurrent: Secondary | ICD-10-CM | POA: Diagnosis not present

## 2023-05-13 ENCOUNTER — Ambulatory Visit: Payer: PPO | Admitting: Vascular Surgery

## 2023-05-13 ENCOUNTER — Encounter (HOSPITAL_COMMUNITY): Payer: PPO

## 2023-05-13 ENCOUNTER — Other Ambulatory Visit (HOSPITAL_COMMUNITY): Payer: PPO

## 2023-05-20 DIAGNOSIS — R7989 Other specified abnormal findings of blood chemistry: Secondary | ICD-10-CM | POA: Diagnosis not present

## 2023-05-20 DIAGNOSIS — E785 Hyperlipidemia, unspecified: Secondary | ICD-10-CM | POA: Diagnosis not present

## 2023-05-20 DIAGNOSIS — N401 Enlarged prostate with lower urinary tract symptoms: Secondary | ICD-10-CM | POA: Diagnosis not present

## 2023-05-20 DIAGNOSIS — R972 Elevated prostate specific antigen [PSA]: Secondary | ICD-10-CM | POA: Diagnosis not present

## 2023-05-27 DIAGNOSIS — Z0000 Encounter for general adult medical examination without abnormal findings: Secondary | ICD-10-CM | POA: Diagnosis not present

## 2023-05-27 DIAGNOSIS — Z95828 Presence of other vascular implants and grafts: Secondary | ICD-10-CM | POA: Diagnosis not present

## 2023-05-27 DIAGNOSIS — D692 Other nonthrombocytopenic purpura: Secondary | ICD-10-CM | POA: Diagnosis not present

## 2023-05-27 DIAGNOSIS — Z860101 Personal history of adenomatous and serrated colon polyps: Secondary | ICD-10-CM | POA: Diagnosis not present

## 2023-05-27 DIAGNOSIS — K4090 Unilateral inguinal hernia, without obstruction or gangrene, not specified as recurrent: Secondary | ICD-10-CM | POA: Diagnosis not present

## 2023-05-27 DIAGNOSIS — I700 Atherosclerosis of aorta: Secondary | ICD-10-CM | POA: Diagnosis not present

## 2023-05-27 DIAGNOSIS — N529 Male erectile dysfunction, unspecified: Secondary | ICD-10-CM | POA: Diagnosis not present

## 2023-05-27 DIAGNOSIS — E785 Hyperlipidemia, unspecified: Secondary | ICD-10-CM | POA: Diagnosis not present

## 2023-05-27 DIAGNOSIS — Z1331 Encounter for screening for depression: Secondary | ICD-10-CM | POA: Diagnosis not present

## 2023-05-27 DIAGNOSIS — I739 Peripheral vascular disease, unspecified: Secondary | ICD-10-CM | POA: Diagnosis not present

## 2023-05-27 DIAGNOSIS — C4490 Unspecified malignant neoplasm of skin, unspecified: Secondary | ICD-10-CM | POA: Diagnosis not present

## 2023-05-27 DIAGNOSIS — Z1339 Encounter for screening examination for other mental health and behavioral disorders: Secondary | ICD-10-CM | POA: Diagnosis not present

## 2023-06-02 ENCOUNTER — Ambulatory Visit: Payer: PPO | Admitting: Podiatry

## 2023-06-02 ENCOUNTER — Encounter: Payer: Self-pay | Admitting: Podiatry

## 2023-06-02 DIAGNOSIS — L03031 Cellulitis of right toe: Secondary | ICD-10-CM | POA: Diagnosis not present

## 2023-06-02 MED ORDER — DOXYCYCLINE HYCLATE 100 MG PO TABS
100.0000 mg | ORAL_TABLET | Freq: Two times a day (BID) | ORAL | 1 refills | Status: DC
Start: 2023-06-02 — End: 2023-10-16

## 2023-06-02 NOTE — Progress Notes (Signed)
Subjective:   Patient ID: [name], male   DOB: 72 y.o.   MRN: [id number]   HPI Patient states he really has traumatized his right big toe and it has been bleeding and has been inflamed on the medial side.  States it is extending in the inner phalangeal joint and has been very active   ROS      Objective:  Physical Exam  Neuro vascular status unchanged with patient found to harbor and eroded necrotic right hallux medial border localized no other pathology mild redness within the toe itself to the distal interphalangeal joint     Assessment:  Paronychia right hallux with necrotic tissue on the medial side but localized F2 H&P and I reviewed today and I did discuss this with patient and at this point I anesthetized the right big toe and went ahead and remove the medial border removed necrotic tissue flushed the area looks much cleaner and placed on doxycycline and applied sterile dressing.  Reappoint to recheck begin soaks tomorrow and if any issues were to occur I want to see right away     Plan:  The plan is listed above after F2

## 2023-06-12 DIAGNOSIS — L812 Freckles: Secondary | ICD-10-CM | POA: Diagnosis not present

## 2023-06-12 DIAGNOSIS — L570 Actinic keratosis: Secondary | ICD-10-CM | POA: Diagnosis not present

## 2023-06-12 DIAGNOSIS — L700 Acne vulgaris: Secondary | ICD-10-CM | POA: Diagnosis not present

## 2023-06-12 DIAGNOSIS — D485 Neoplasm of uncertain behavior of skin: Secondary | ICD-10-CM | POA: Diagnosis not present

## 2023-06-12 DIAGNOSIS — L821 Other seborrheic keratosis: Secondary | ICD-10-CM | POA: Diagnosis not present

## 2023-06-12 DIAGNOSIS — C44529 Squamous cell carcinoma of skin of other part of trunk: Secondary | ICD-10-CM | POA: Diagnosis not present

## 2023-06-12 DIAGNOSIS — Z85828 Personal history of other malignant neoplasm of skin: Secondary | ICD-10-CM | POA: Diagnosis not present

## 2023-06-12 DIAGNOSIS — L720 Epidermal cyst: Secondary | ICD-10-CM | POA: Diagnosis not present

## 2023-06-13 ENCOUNTER — Encounter: Payer: Self-pay | Admitting: Vascular Surgery

## 2023-06-17 ENCOUNTER — Ambulatory Visit (HOSPITAL_COMMUNITY)
Admission: RE | Admit: 2023-06-17 | Discharge: 2023-06-17 | Disposition: A | Discharge status: Home / Self Care | Payer: PPO | Source: Ambulatory Visit | Attending: Vascular Surgery | Admitting: Vascular Surgery

## 2023-06-17 ENCOUNTER — Ambulatory Visit: Payer: PPO | Admitting: Vascular Surgery

## 2023-06-17 ENCOUNTER — Encounter: Payer: Self-pay | Admitting: Vascular Surgery

## 2023-06-17 VITALS — BP 141/76 | HR 66 | Temp 98.3°F | Resp 20 | Ht 72.0 in | Wt 166.0 lb

## 2023-06-17 DIAGNOSIS — I739 Peripheral vascular disease, unspecified: Secondary | ICD-10-CM | POA: Insufficient documentation

## 2023-06-18 LAB — VAS US ABI WITH/WO TBI
Left ABI: 1.2
Right ABI: ABSENT

## 2023-06-18 NOTE — Progress Notes (Signed)
[facility] [place]  ASSESSMENT / PLAN: [name] is a 72 y.o. male with history of right femoral-peroneal bypass 10/13/20 for ischemic ulceration of right foot. 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  This is unfortunately thrombosed.  He had developed a rich collateral network which is making his PAD symptoms minimal.  Recommend: Complete cessation from all tobacco products. Blood glucose control with goal A1c < 7%. Blood pressure control with goal blood pressure < 140/90 mmHg. Lipid reduction therapy with goal LDL-C <70 mg/dL  Aspirin 81mg  PO QD.  Rivaroxaban 2.5mg  PO BID. Atorvastatin 40-80mg  PO QD (or other "high intensity" statin therapy).  I am concerned about the development of an ulcer, he appears to be healing this.  I am not quite sure how he is able to do so well except his habit of exercise likely development of a rich collateral network in the right leg.  An abundance of caution I will see him again in 1 month for wound check.  CHIEF COMPLAINT: follow up  HISTORY OF PRESENT ILLNESS: [name] is a 72 y.o. male very well known to me with complex peripheral vascular history. He initially presented to care in Spring 2022 with disabling claudication. A cardiologist had previously informed him "nothing could be done" for his leg. He sought an opinion from me. A trial of watchful waiting was performed, but he returned to care with ischemic ulceration of the great toe about a month later. He underwent right femoral - peroneal bypass which he recovered from well. He healed his foot rapidly. He returned to exercise quickly. He had significant swelling in his leg postoperatively. Angiography revealed fistulae which were treated with coil embolization. He then progressed a proximal stenotic lesion and had low velocities in the graft. This was treated with angioplasty and stenting with  good result.  He presents today for surveillance.  He is doing very well.  He is exercising is much as he likes.  He has some very mild calf cramping when walking uphill for a very long distance but otherwise feels great.  12/31/22: Clinic to report cramping discomfort in his right lower extremity has returned.  He has been able to continue exercising at the gym.  He has noticed cramping when walking uphill or for long distances.  He has noticed some discoloration in his right foot as well.  We reviewed his noninvasive testing and clinical exam results [place].  His wife is with him today.  04/08/23: Patient returns to clinic for surveillance.  He is doing fairly well.  He has long distance claudication.  He is tolerating exercise therapy fairly well.  He has no rest pain.  He has no ulceration about his feet.  06/18/23: Patient worked into the clinic for evaluation after toenail debridement by Dr. Dellia Nims.  The patient has been slowly healing his debridement over the past several weeks.  He has been treated with antibiotics.  Patient reports very long distance claudication symptoms (about quarter mile) and no rest pain.  VASCULAR SURGICAL HISTORY:  10/13/20: right common femoral to peroneal artery bypass with in-situ greater saphenous vein  11/01/20: Coil embolization of arteriovenous fistula x 2 in R fem-pop bypass (6mm Nestor coils x 4 total, 2 in each fistula)  12/28/21: Right femoral-peroneal bypass angioplasty and stenting (7x20mm Viabahn)   VASCULAR RISK FACTORS: Negative history of stroke / transient ischemic attack. Negative history of coronary artery  disease.  Negative history of diabetes mellitus.  Negative history of smoking.  Positive history of hypertension.  Negative history of chronic kidney disease.   Negative history of chronic obstructive pulmonary disease.  FUNCTIONAL STATUS: ECOG performance status: (0) Fully active, able to carry on all predisease performance without  restriction Ambulatory status: Ambulatory within the community without limits  Past Medical History:  Diagnosis Date   Claudication [facility])    Elevated PSA    Erectile dysfunction    Hyperlipidemia    Hyperlipidemia    PAD (peripheral artery disease) (HCC)     Past Surgical History:  Procedure Laterality Date   ABDOMINAL AORTOGRAM W/LOWER EXTREMITY [name] 09/11/2020   Procedure: ABDOMINAL AORTOGRAM W/LOWER EXTREMITY;  Surgeon: Runell Gess, MD;  [facility];  Service: Cardiovascular;  Laterality: [name];   ABDOMINAL AORTOGRAM W/LOWER EXTREMITY [name] 11/01/2020   Procedure: ABDOMINAL AORTOGRAM W/LOWER EXTREMITY;  Surgeon: Leonie Douglas, MD;  Location: [facility];  Service: Cardiovascular;  Laterality: [name];   ABDOMINAL AORTOGRAM W/LOWER EXTREMITY Right 12/28/2021   Procedure: ABDOMINAL AORTOGRAM W/LOWER EXTREMITY;  Surgeon: Leonie Douglas, MD;  [facility]: [facility];  Service: Cardiovascular;  Laterality: Right;   COLONOSCOPY     x2   FEMORAL-TIBIAL BYPASS GRAFT Right 10/13/2020   Procedure: RIGHT COMMON FEMORAL ARTERY TO PERONEAL ARTERY BYPASS WITH INSITU GREATER SAPHENOUS VEIN;  Surgeon: Leonie Douglas, MD;  Location: [facility] OR;  Service: Vascular;  Laterality: Right;   PERIPHERAL VASCULAR INTERVENTION Right 11/01/2020   Procedure: PERIPHERAL VASCULAR INTERVENTION;  Surgeon: Leonie Douglas, MD;  [facility];  Service: Cardiovascular;  Laterality: Right;   PERIPHERAL VASCULAR INTERVENTION Right 12/28/2021   Procedure: PERIPHERAL VASCULAR INTERVENTION;  Surgeon: Leonie Douglas, MD;  [facility];  Service: Cardiovascular;  Laterality: Right;  FEM/TIB BYPASS GRAFT   WITH THOMBECTOMY   TONSILLECTOMY     TONSILLECTOMY AND ADENOIDECTOMY      Family History  Adopted: Yes    Social History   Socioeconomic History   Marital status: Married    Spouse name: [name]   Number of children: [name]   Years of education:  [name]   Highest education level: [name]  Occupational History   [name]  Tobacco Use   Smoking status: Former    Current packs/day: 0.00    Types: Cigarettes    Quit date: 35    Years since quitting: 43.9    Passive exposure: Never   Smokeless tobacco: Never  Vaping Use   Vaping status: Never Used  Substance and Sexual Activity   Alcohol use: Yes    Alcohol/week: 4.0 standard drinks of alcohol    Types: 2 Cans of beer, 2 Shots of liquor per week    Comment: socially   Drug use: Never   Sexual activity: [name]  Other Topics Concern   [name]  Social History Narrative   [name]   Social Determinants of Health   Financial Resource Strain: [name]  Food Insecurity: No Food Insecurity (12/10/2022)   Hunger Vital Sign    Worried About Running Out of Food in the Last Year: Never true    Ran Out of Food in the Last Year: Never true  Transportation Needs: No Transportation Needs (12/10/2022)   PRAPARE - Administrator, Civil Service (Medical): No    Lack of Transportation (Non-Medical):  No  Physical Activity: Sufficiently Active (12/10/2022)   Exercise Vital Sign    Days of Exercise per Week: 4 days    Minutes of Exercise per Session: 60 min  Stress: [name]  Social Connections: [name]  Intimate Partner Violence: [name]    No Known Allergies  Current Outpatient Medications  Medication Sig Dispense Refill   aspirin EC 81 MG tablet Take 81 mg by mouth daily. Swallow whole.     doxycycline (VIBRA-TABS) 100 MG tablet Take 1 tablet (100 mg total) by mouth 2 (two) times daily. 20 tablet 1   ezetimibe (ZETIA) 10 MG tablet Take 1 tablet (10 mg total) by mouth daily. 90 tablet 3   neomycin-polymyxin b-dexamethasone (MAXITROL) 3.5-10000-0.1 SUSP Place 1 drop into the left eye daily.     REPATHA 140 MG/ML SOSY Inject 1 mL into the skin every 14 (fourteen) days.     rosuvastatin (CRESTOR) 40 MG tablet TAKE 1 TABLET BY MOUTH EVERY  EVENING. 90 tablet 3   XARELTO 2.5 MG TABS tablet TAKE 1 TABLET (2.5 MG TOTAL) BY MOUTH 2 (TWO) TIMES DAILY. 60 tablet 11   Current Facility-Administered Medications  Medication Dose Route Frequency Provider Last Rate Last Admin   sodium chloride flush (NS) 0.9 % injection 3 mL  3 mL Intravenous Q12H Runell Gess, MD        PHYSICAL EXAM Vitals:   06/17/23 1445  BP: (!) 141/76  Pulse: 66  Resp: 20  Temp: 98.3 F (36.8 C)  SpO2: 100%  Weight: 166 lb (75.3 kg)  Height: 6' (1.829 m)    [name].  He appears younger than his stated age. Right leg well healed and warm.  No Doppler flow in the right foot. Capillary refill is less than 2 seconds in the toes Right great toe debridement appears to be healing. No signs of infection.  PERTINENT LABORATORY AND RADIOLOGIC DATA  Most recent CBC    Latest Ref Rng & Units 12/28/2021    5:53 AM 04/24/2021    8:55 AM 03/23/2021    2:09 PM  CBC  WBC 3.8 - 10.8 Thousand/uL  7.1  7.3   Hemoglobin 13.0 - 17.0 g/dL 16.1  09.6  04.5   Hematocrit 39.0 - 52.0 % 45.0  46.4  45.1   Platelets 140 - 400 Thousand/uL  223  227      Most recent CMP    Latest Ref Rng & Units 12/28/2021    5:53 AM 04/24/2021    8:55 AM 03/23/2021    2:09 PM  CMP  Glucose 70 - 99 mg/dL 409  85  94   BUN 8 - 23 mg/dL 23  21  18    Creatinine 0.61 - 1.24 mg/dL 8.11  9.14  7.82   Sodium 135 - 145 mmol/L 139  143  141   Potassium 3.5 - 5.1 mmol/L 3.9  5.1  4.3   Chloride 98 - 111 mmol/L 99  107  105   CO2 20 - 32 mmol/L  30  33   Calcium 8.6 - 10.3 mg/dL  9.1  9.0     Renal function CrCl cannot be calculated (Patient's most recent lab result is older than the maximum 21 days allowed.).  No results found for: "HGBA1C"  LDL Chol Calc (NIH)  Date Value Ref Range Status  01/02/2021 70 0 - 99 mg/dL Final     +-------+-----------+-----------+------------+------------+  ABI/TBIToday's ABIToday's TBIPrevious ABIPrevious  TBI  +-------+-----------+-----------+------------+------------+  Right absent     absent     absent      absent        +-------+-----------+-----------+------------+------------+  Left  1.20       0.77       1.16        not saved     +-------+-----------+-----------+------------+------------+      Rande Brunt. Lenell Antu, MD [facility] Phone Number: [phone] 06/18/2023 2:51 PM   Total time spent on preparing this encounter including chart review, data review, collecting history, examining the patient, coordinating care for this established patient, 30 minutes.  Portions of this report may have been transcribed using voice recognition software.  Every effort has been made to ensure accuracy; however, inadvertent computerized transcription errors may still be present.

## 2023-07-15 ENCOUNTER — Encounter: Payer: Self-pay | Admitting: Internal Medicine

## 2023-07-18 ENCOUNTER — Other Ambulatory Visit (HOSPITAL_COMMUNITY): Payer: Self-pay

## 2023-07-18 ENCOUNTER — Telehealth: Payer: Self-pay | Admitting: Pharmacy Technician

## 2023-07-18 ENCOUNTER — Telehealth: Payer: Self-pay | Admitting: Internal Medicine

## 2023-07-18 NOTE — Telephone Encounter (Signed)
 PA request has been Submitted. New Encounter created for follow up. For additional info see Pharmacy Prior Auth telephone encounter from 07/18/23.

## 2023-07-18 NOTE — Telephone Encounter (Signed)
 Pharmacy Patient Advocate Encounter   Received notification from Pt Calls Messages that prior authorization for Repatha  SureClick 140MG /ML auto-injectors is required/requested.   Insurance verification completed.   The patient is insured through [facility] ADVANTAGE/RX ADVANCE .   Per test claim: PA required; PA submitted to above mentioned insurance via CoverMyMeds Key/confirmation #/EOC [id number] Status is pending

## 2023-07-18 NOTE — Telephone Encounter (Signed)
 Requesting cb to get additional information for repatha

## 2023-07-18 NOTE — Telephone Encounter (Signed)
 Patient's insurance company is requesting a PA for Repatha. Patient stated Dr Rennis Golden is managing his Repatha now and not his PCP.  Health Team adv [phone] Option 2

## 2023-07-21 ENCOUNTER — Other Ambulatory Visit (HOSPITAL_COMMUNITY): Payer: Self-pay

## 2023-07-21 MED ORDER — REPATHA 140 MG/ML ~~LOC~~ SOSY
1.0000 mL | PREFILLED_SYRINGE | SUBCUTANEOUS | 3 refills | Status: DC
  Filled 2023-07-21: qty 6, 84d supply, fill #0

## 2023-07-21 MED ORDER — REPATHA 140 MG/ML ~~LOC~~ SOSY: 1.0000 mL | PREFILLED_SYRINGE | SUBCUTANEOUS | 3 refills | Status: DC

## 2023-07-21 NOTE — Addendum Note (Signed)
 Addended by: Kurtis Bushman on: 07/21/2023 10:17 AM   Modules accepted: Orders

## 2023-07-21 NOTE — Telephone Encounter (Signed)
 Called and spoke to patient. Verified name and DOB. Informed patient PA for Repatha was approved. He requested RX be sent to [facility]. Prescriptions sent as requested.

## 2023-07-21 NOTE — Telephone Encounter (Signed)
 Pharmacy Patient Advocate Encounter  Received notification from [facility] ADVANTAGE/RX ADVANCE that Prior Authorization for Repatha  SureClick 140MG /ML auto-injectors has been APPROVED from 07/18/23 to 07/17/24. Ran test claim, Copay is $47.00. This test claim was processed through [facility]- copay amounts may vary at other pharmacies due to pharmacy/plan contracts, or as the patient moves through the different stages of their insurance plan.  I didn't see where the prescription was to call the pharmacy so I just did a test claim in our pharmacy.

## 2023-08-04 NOTE — Progress Notes (Unsigned)
[facility] [place]  ASSESSMENT / PLAN: [name] is a 73 y.o. male with history of right femoral-peroneal bypass 10/13/20 for ischemic ulceration of right foot. 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  This is unfortunately thrombosed.  He had developed a rich collateral network which is making his PAD symptoms minimal.  Recommend: Complete cessation from all tobacco products. Blood glucose control with goal A1c < 7%. Blood pressure control with goal blood pressure < 140/90 mmHg. Lipid reduction therapy with goal LDL-C <70 mg/dL  Aspirin 81mg  PO QD.  Rivaroxaban 2.5mg  PO BID. Atorvastatin 40-80mg  PO QD (or other "high intensity" statin therapy).  I am concerned about the development of an ulcer, he appears to be healing this.  I am not quite sure how he is able to do so well except his habit of exercise likely development of a rich collateral network in the right leg.  An abundance of caution I will see him again in 1 month for wound check.  CHIEF COMPLAINT: follow up  HISTORY OF PRESENT ILLNESS: [name] is a 73 y.o. male very well known to me with complex peripheral vascular history. He initially presented to care in Spring 2022 with disabling claudication. A cardiologist had previously informed him "nothing could be done" for his leg. He sought an opinion from me. A trial of watchful waiting was performed, but he returned to care with ischemic ulceration of the great toe about a month later. He underwent right femoral - peroneal bypass which he recovered from well. He healed his foot rapidly. He returned to exercise quickly. He had significant swelling in his leg postoperatively. Angiography revealed fistulae which were treated with coil embolization. He then progressed a proximal stenotic lesion and had low velocities in the graft. This was treated with angioplasty and stenting with  good result.  He presents today for surveillance.  He is doing very well.  He is exercising is much as he likes.  He has some very mild calf cramping when walking uphill for a very long distance but otherwise feels great.  12/31/22: Clinic to report cramping discomfort in his right lower extremity has returned.  He has been able to continue exercising at the gym.  He has noticed cramping when walking uphill or for long distances.  He has noticed some discoloration in his right foot as well.  We reviewed his noninvasive testing and clinical exam results [place].  His wife is with him today.  04/08/23: Patient returns to clinic for surveillance.  He is doing fairly well.  He has long distance claudication.  He is tolerating exercise therapy fairly well.  He has no rest pain.  He has no ulceration about his feet.  06/18/23: Patient worked into the clinic for evaluation after toenail debridement by Dr. Dellia Nims.  The patient has been slowly healing his debridement over the past several weeks.  He has been treated with antibiotics.  Patient reports very long distance claudication symptoms (about quarter mile) and no rest pain.  VASCULAR SURGICAL HISTORY:  10/13/20: right common femoral to peroneal artery bypass with in-situ greater saphenous vein  11/01/20: Coil embolization of arteriovenous fistula x 2 in R fem-pop bypass (6mm Nestor coils x 4 total, 2 in each fistula)  12/28/21: Right femoral-peroneal bypass angioplasty and stenting (7x45mm Viabahn)   VASCULAR RISK FACTORS: Negative history of stroke / transient ischemic attack. Negative history of coronary artery  disease.  Negative history of diabetes mellitus.  Negative history of smoking.  Positive history of hypertension.  Negative history of chronic kidney disease.   Negative history of chronic obstructive pulmonary disease.  FUNCTIONAL STATUS: ECOG performance status: (0) Fully active, able to carry on all predisease performance without  restriction Ambulatory status: Ambulatory within the community without limits  Past Medical History:  Diagnosis Date   Claudication [facility])    Elevated PSA    Erectile dysfunction    Hyperlipidemia    Hyperlipidemia    PAD (peripheral artery disease) (HCC)     Past Surgical History:  Procedure Laterality Date   ABDOMINAL AORTOGRAM W/LOWER EXTREMITY [name] 09/11/2020   Procedure: ABDOMINAL AORTOGRAM W/LOWER EXTREMITY;  Surgeon: Runell Gess, MD;  [facility];  Service: Cardiovascular;  Laterality: [name];   ABDOMINAL AORTOGRAM W/LOWER EXTREMITY [name] 11/01/2020   Procedure: ABDOMINAL AORTOGRAM W/LOWER EXTREMITY;  Surgeon: Leonie Douglas, MD;  [facility];  Service: Cardiovascular;  Laterality: [name];   ABDOMINAL AORTOGRAM W/LOWER EXTREMITY Right 12/28/2021   Procedure: ABDOMINAL AORTOGRAM W/LOWER EXTREMITY;  Surgeon: Leonie Douglas, MD;  [facility];  Service: Cardiovascular;  Laterality: Right;   COLONOSCOPY     x2   FEMORAL-TIBIAL BYPASS GRAFT Right 10/13/2020   Procedure: RIGHT COMMON FEMORAL ARTERY TO PERONEAL ARTERY BYPASS WITH INSITU GREATER SAPHENOUS VEIN;  Surgeon: Leonie Douglas, MD;  Location: [facility];  Service: Vascular;  Laterality: Right;   PERIPHERAL VASCULAR INTERVENTION Right 11/01/2020   Procedure: PERIPHERAL VASCULAR INTERVENTION;  Surgeon: Leonie Douglas, MD;  [facility];  Service: Cardiovascular;  Laterality: Right;   PERIPHERAL VASCULAR INTERVENTION Right 12/28/2021   Procedure: PERIPHERAL VASCULAR INTERVENTION;  Surgeon: Leonie Douglas, MD;  [facility];  Service: Cardiovascular;  Laterality: Right;  FEM/TIB BYPASS GRAFT   WITH THOMBECTOMY   TONSILLECTOMY     TONSILLECTOMY AND ADENOIDECTOMY      Family History  Adopted: Yes    Social History   Socioeconomic History   Marital status: Married    Spouse name: [name]   Number of children: [name]   Years of education:  [name]   Highest education level: [name]  Occupational History   [name]  Tobacco Use   Smoking status: Former    Current packs/day: 0.00    Types: Cigarettes    Quit date: 59    Years since quitting: 44.1    Passive exposure: Never   Smokeless tobacco: Never  Vaping Use   Vaping status: Never Used  Substance and Sexual Activity   Alcohol use: Yes    Alcohol/week: 4.0 standard drinks of alcohol    Types: 2 Cans of beer, 2 Shots of liquor per week    Comment: socially   Drug use: Never   Sexual activity: [name]  Other Topics Concern   [name]  Social History Narrative   [name]   Social Drivers of Health   Financial Resource Strain: [name]  Food Insecurity: No Food Insecurity (12/10/2022)   Hunger Vital Sign    Worried About Running Out of Food in the Last Year: Never true    Ran Out of Food in the Last Year: Never true  Transportation Needs: No Transportation Needs (12/10/2022)   PRAPARE - Administrator, Civil Service (Medical): No    Lack of Transportation (Non-Medical):  No  Physical Activity: Sufficiently Active (12/10/2022)   Exercise Vital Sign    Days of Exercise per Week: 4 days    Minutes of Exercise per Session: 60 min  Stress: [name]  Social Connections: [name]  Intimate Partner Violence: [name]    No Known Allergies  Current Outpatient Medications  Medication Sig Dispense Refill   aspirin EC 81 MG tablet Take 81 mg by mouth daily. Swallow whole.     doxycycline (VIBRA-TABS) 100 MG tablet Take 1 tablet (100 mg total) by mouth 2 (two) times daily. 20 tablet 1   ezetimibe (ZETIA) 10 MG tablet Take 1 tablet (10 mg total) by mouth daily. 90 tablet 3   neomycin-polymyxin b-dexamethasone (MAXITROL) 3.5-10000-0.1 SUSP Place 1 drop into the left eye daily.     REPATHA 140 MG/ML SOSY Inject 140 mg into the skin every 14 (fourteen) days. 6 mL 3   rosuvastatin (CRESTOR) 40 MG tablet TAKE 1 TABLET BY MOUTH  EVERY EVENING. 90 tablet 3   XARELTO 2.5 MG TABS tablet TAKE 1 TABLET (2.5 MG TOTAL) BY MOUTH 2 (TWO) TIMES DAILY. 60 tablet 11   Current Facility-Administered Medications  Medication Dose Route Frequency Provider Last Rate Last Admin   sodium chloride flush (NS) 0.9 % injection 3 mL  3 mL Intravenous Q12H Runell Gess, MD        PHYSICAL EXAM There were no vitals filed for this visit.   [name].  He appears younger than his stated age. Right leg well healed and warm.  No Doppler flow in the right foot. Capillary refill is less than 2 seconds in the toes Right great toe debridement appears to be healing. No signs of infection.  PERTINENT LABORATORY AND RADIOLOGIC DATA  Most recent CBC    Latest Ref Rng & Units 12/28/2021    5:53 AM 04/24/2021    8:55 AM 03/23/2021    2:09 PM  CBC  WBC 3.8 - 10.8 Thousand/uL  7.1  7.3   Hemoglobin 13.0 - 17.0 g/dL 40.9  81.1  91.4   Hematocrit 39.0 - 52.0 % 45.0  46.4  45.1   Platelets 140 - 400 Thousand/uL  223  227      Most recent CMP    Latest Ref Rng & Units 12/28/2021    5:53 AM 04/24/2021    8:55 AM 03/23/2021    2:09 PM  CMP  Glucose 70 - 99 mg/dL 782  85  94   BUN 8 - 23 mg/dL 23  21  18    Creatinine 0.61 - 1.24 mg/dL 9.56  2.13  0.86   Sodium 135 - 145 mmol/L 139  143  141   Potassium 3.5 - 5.1 mmol/L 3.9  5.1  4.3   Chloride 98 - 111 mmol/L 99  107  105   CO2 20 - 32 mmol/L  30  33   Calcium 8.6 - 10.3 mg/dL  9.1  9.0     Renal function CrCl cannot be calculated (Patient's most recent lab result is older than the maximum 21 days allowed.).  No results found for: "HGBA1C"  LDL Chol Calc (NIH)  Date Value Ref Range Status  01/02/2021 70 0 - 99 mg/dL Final     +-------+-----------+-----------+------------+------------+  ABI/TBIToday's ABIToday's TBIPrevious ABIPrevious TBI  +-------+-----------+-----------+------------+------------+  Right absent     absent      absent      absent        +-------+-----------+-----------+------------+------------+  Left  1.20       0.77       1.16        not saved     +-------+-----------+-----------+------------+------------+      Rande Brunt. Lenell Antu, MD FACS [facility] Phone Number: [phone] 08/04/2023 9:56 AM   Total time spent on preparing this encounter including chart review, data review, collecting history, examining the patient, coordinating care for this established patient, 30 minutes.  Portions of this report may have been transcribed using voice recognition software.  Every effort has been made to ensure accuracy; however, inadvertent computerized transcription errors may still be present.

## 2023-08-05 ENCOUNTER — Encounter: Payer: Self-pay | Admitting: Vascular Surgery

## 2023-08-05 ENCOUNTER — Ambulatory Visit: Payer: PPO | Admitting: Vascular Surgery

## 2023-08-05 VITALS — BP 133/75 | HR 78 | Temp 98.3°F | Resp 20 | Ht 72.0 in | Wt 168.0 lb

## 2023-08-05 DIAGNOSIS — I739 Peripheral vascular disease, unspecified: Secondary | ICD-10-CM

## 2023-10-14 ENCOUNTER — Encounter (HOSPITAL_COMMUNITY): Payer: PPO

## 2023-10-14 ENCOUNTER — Ambulatory Visit: Payer: PPO | Admitting: Vascular Surgery

## 2023-10-17 NOTE — Progress Notes (Addendum)
 Surgical Instructions   Your procedure is scheduled on Wednesday, October 29, 2023. Report to [facility] Main Entrance "A" at 6:30 A.M., then check in with the Admitting office. Any questions or running late day of surgery: call [phone]  Questions prior to your surgery date: call [phone], Monday-Friday, 8am-4pm. If you experience any cold or flu symptoms such as cough, fever, chills, shortness of breath, etc. between now and your scheduled surgery, please notify us  at the above number.     Remember:  Do not eat after midnight the night before your surgery   You may drink clear liquids until 5:30 the morning of your surgery.   Clear liquids allowed are: Water, Non-Citrus Juices (without pulp), Carbonated Beverages, Clear Tea (no milk, honey, etc.), Black Coffee Only (NO MILK, CREAM OR POWDERED CREAMER of any kind), and Gatorade.    Take these medicines the morning of surgery with A SIP OF WATER  ezetimibe (ZETIA)  REPATHA   Per your physician's instruction, HOLD your XARELTO for 2 day's prior to surgery.  Your last dose should be on Sunday, April 20th.  One week prior to surgery, STOP taking any Aspirin (unless otherwise instructed by your surgeon) Aleve, Naproxen, Ibuprofen, Motrin, Advil, Goody's, BC's, all herbal medications, fish oil, and non-prescription vitamins.                     Do NOT Smoke (Tobacco/Vaping) for 24 hours prior to your procedure.  If you use a CPAP at night, you may bring your mask/headgear for your overnight stay.   You will be asked to remove any contacts, glasses, piercing's, hearing aid's, dentures/partials prior to surgery. Please bring cases for these items if needed.    Patients discharged the day of surgery will not be allowed to drive home, and someone needs to stay with them for 24 hours.  SURGICAL WAITING ROOM VISITATION Patients may have no more than 2 support people in the waiting area - these visitors may rotate.   Pre-op nurse will  coordinate an appropriate time for 1 ADULT support person, who may not rotate, to accompany patient in pre-op.  Children under the age of 77 must have an adult with them who is not the patient and must remain in the main waiting area with an adult.  If the patient needs to stay at the hospital during part of their recovery, the visitor guidelines for inpatient rooms apply.  Please refer to the [facility] website for the visitor guidelines for any additional information.   If you received a COVID test during your pre-op visit  it is requested that you wear a mask when out in public, stay away from anyone that may not be feeling well and notify your surgeon if you develop symptoms. If you have been in contact with anyone that has tested positive in the last 10 days please notify you surgeon.      Pre-operative CHG Bathing Instructions   You can play a key role in reducing the risk of infection after surgery. Your skin needs to be as free of germs as possible. You can reduce the number of germs on your skin by washing with CHG (chlorhexidine gluconate) soap before surgery. CHG is an antiseptic soap that kills germs and continues to kill germs even after washing.   DO NOT use if you have an allergy to chlorhexidine/CHG or antibacterial soaps. If your skin becomes reddened or irritated, stop using the CHG and notify one of our  RNs at [phone].              TAKE A SHOWER THE NIGHT BEFORE SURGERY AND THE DAY OF SURGERY    Please keep in mind the following:  DO NOT shave, including legs and underarms, 48 hours prior to surgery.   You may shave your face before/day of surgery.  Place clean sheets on your bed the night before surgery Use a clean washcloth (not used since being washed) for each shower. DO NOT sleep with pet's night before surgery.  CHG Shower Instructions:  Wash your face and private area with normal soap. If you choose to wash your hair, wash first with your normal shampoo.   After you use shampoo/soap, rinse your hair and body thoroughly to remove shampoo/soap residue.  Turn the water OFF and apply half the bottle of CHG soap to a CLEAN washcloth.  Apply CHG soap ONLY FROM YOUR NECK DOWN TO YOUR TOES (washing for 3-5 minutes)  DO NOT use CHG soap on face, private areas, open wounds, or sores.  Pay special attention to the area where your surgery is being performed.  If you are having back surgery, having someone wash your back for you may be helpful. Wait 2 minutes after CHG soap is applied, then you may rinse off the CHG soap.  Pat dry with a clean towel  Put on clean pajamas    Additional instructions for the day of surgery: DO NOT APPLY any lotions, deodorants, cologne, or perfumes.   Do not wear jewelry or makeup Do not wear nail polish, gel polish, artificial nails, or any other type of covering on natural nails (fingers and toes) Do not bring valuables to the hospital. [facility] is not responsible for valuables/personal belongings. Put on clean/comfortable clothes.  Please brush your teeth.  Ask your nurse before applying any prescription medications to the skin.

## 2023-10-20 ENCOUNTER — Encounter: Payer: Self-pay | Admitting: Vascular Surgery

## 2023-10-20 ENCOUNTER — Encounter (HOSPITAL_COMMUNITY)
Admission: RE | Admit: 2023-10-20 | Discharge: 2023-10-20 | Disposition: A | Discharge status: Home / Self Care | Source: Ambulatory Visit | Attending: General Surgery | Admitting: General Surgery

## 2023-10-20 ENCOUNTER — Other Ambulatory Visit: Payer: Self-pay

## 2023-10-20 ENCOUNTER — Encounter (HOSPITAL_COMMUNITY): Payer: Self-pay

## 2023-10-20 VITALS — BP 140/81 | HR 64 | Temp 98.7°F | Resp 17 | Ht 72.0 in | Wt 165.0 lb

## 2023-10-20 DIAGNOSIS — K4020 Bilateral inguinal hernia, without obstruction or gangrene, not specified as recurrent: Secondary | ICD-10-CM | POA: Insufficient documentation

## 2023-10-20 DIAGNOSIS — E785 Hyperlipidemia, unspecified: Secondary | ICD-10-CM | POA: Insufficient documentation

## 2023-10-20 DIAGNOSIS — I739 Peripheral vascular disease, unspecified: Secondary | ICD-10-CM | POA: Insufficient documentation

## 2023-10-20 DIAGNOSIS — R931 Abnormal findings on diagnostic imaging of heart and coronary circulation: Secondary | ICD-10-CM | POA: Insufficient documentation

## 2023-10-20 DIAGNOSIS — Z87891 Personal history of nicotine dependence: Secondary | ICD-10-CM | POA: Insufficient documentation

## 2023-10-20 DIAGNOSIS — Z01818 Encounter for other preprocedural examination: Secondary | ICD-10-CM | POA: Insufficient documentation

## 2023-10-20 LAB — CBC
HCT: 49.4 % (ref 39.0–52.0)
Hemoglobin: 16.5 g/dL (ref 13.0–17.0)
MCH: 31.5 pg (ref 26.0–34.0)
MCHC: 33.4 g/dL (ref 30.0–36.0)
MCV: 94.5 fL (ref 80.0–100.0)
Platelets: 232 10*3/uL (ref 150–400)
RBC: 5.23 MIL/uL (ref 4.22–5.81)
RDW: 12.7 % (ref 11.5–15.5)
WBC: 9 10*3/uL (ref 4.0–10.5)
nRBC: 0 % (ref 0.0–0.2)

## 2023-10-20 LAB — BASIC METABOLIC PANEL WITH GFR
Anion gap: 6 (ref 5–15)
BUN: 17 mg/dL (ref 8–23)
CO2: 30 mmol/L (ref 22–32)
Calcium: 9.1 mg/dL (ref 8.9–10.3)
Chloride: 103 mmol/L (ref 98–111)
Creatinine, Ser: 1.04 mg/dL (ref 0.61–1.24)
GFR, Estimated: >60 mL/min (ref >60)
Glucose, Bld: 103 mg/dL — ABNORMAL HIGH (ref 70–99)
Potassium: 4.8 mmol/L (ref 3.5–5.1)
Sodium: 139 mmol/L (ref 135–145)

## 2023-10-20 NOTE — Progress Notes (Signed)
 PCP - Dr. Windell Hasty Cardiologist - Dr. Dinah Franco LOV 04-11-23 with follow up in 1 year  PPM/ICD - Denies Device Orders - [name] Rep Notified - [name]  Chest x-ray - [name] EKG - [name] Stress Test - Denies ECHO - Denies Cardiac Cath - Denies  Sleep Study -Denies  CPAP - [name]  Non-diabetic  Last dose of GLP1 agonist-  Denies GLP1 instructions: [name]  Blood Thinner Instructions: Xarelto - hold for 2 days LD: 10-26-23 Aspirin Instructions: Yes  ERAS Protcol - ERAS clears until 5:30 PRE-SURGERY Ensure or G2- n/a  COVID TEST- [name]   Anesthesia review: Yes, cardiac clearance is being questioned.  Patient denies shortness of breath, fever, cough and chest pain at PAT appointment. Patient denies any respiratory issues at this time.    All instructions explained to the patient, with a verbal understanding of the material. Patient agrees to go over the instructions while at home for a better understanding. Patient also instructed to self quarantine after being tested for COVID-19. The opportunity to ask questions was provided.

## 2023-10-21 NOTE — Progress Notes (Signed)
 Anesthesia Chart Review:  Case: [id number] Date/Time: 10/29/23 0815   Procedure: OPEN BILATERAL INGUINAL HERNIA REPAIR WITH MESH (Bilateral)   Anesthesia type: General   Pre-op diagnosis: BILATERAL INGUINAL HERNIA   [facility] RO[facility] / [facility]   Surgeons: Shela Derby, MD       DISCUSSION: Patient is a 72-year male scheduled for the above procedure.  History includes former smoker (quit 1981), HLD, PAD (right CFA-peroneal bypass 4/822; coil embolization of right FTBG AVF x2 11/01/20; mechanical thrombectomy of bypass with angioplasty/stenting 12/28/21).  Last cardiology follow-up was on 04/11/2023 with Dr[facility]. He was doing well. On Repatha for aggressive therapy given coronary calcifications and PAD. Also on Zetia and Crestor.   He is Xarelto per vascular surgeon given PAD. He has had multiple RLE interventions as outlined above. Of note he had hypercoagulable evaluation with hematologist Jacquelyn Matt, MD in May 2022. Per 11/27/20 visit, "11/09/2020; blood counts normal, chemistries normal, mild elevation of ALT, Protein C and Protein S activity/total normal, Lupus negative, beta 2 glycoproteins normal, factor 5 leiden negative, Prothrombin gene mutation negative, ANA negative. -Advised pt that his JAK2 studies came back normal with no mutation detected. -Advised pt that the lab work showed no obvious indication for a blood clotting disorder. - All genetically acquired antibodies and mutation studies came back normal and not detected. No concern for vasculitis.  -Advised pt the extensive workup showed nothing obvious for a clotting disorder or increased risk of clots." As needed hematology follow-up recommended.  Dr. Melton Squires is reaching out to vascular surgeon for permission for patient to hold Xarelto for 2 days prior to surgery.  Currently, last dose planned for 10/26/23.    VS: BP (!) 140/81   Pulse 64   Temp 37.1 C   Resp 17   Ht 6' (1.829 m)   Wt 74.8 kg    SpO2 100%   BMI 22.38 kg/m   PROVIDERS: Windell Hasty, DO is PCP  Hazle Lites, MD is cardiologist ([facility]). He had previously seen Carson Clara, MD and Lauro Portal, MD for PAD but now PAD is primarily followed by vascular surgeon. Runell Countryman, MD is vascular surgeon   LABS: Labs reviewed: Acceptable for surgery. (all labs ordered are listed, but only abnormal results are displayed)  Labs Reviewed  BASIC METABOLIC PANEL WITH GFR - Abnormal; Notable for the following components:      Result Value   Glucose, Bld 103 (*)    All other components within normal limits  CBC     EKG: Appears EKG was not done at PAT visit. Given PAD history, will plan to get an updated EKG on arrival for surgery. Last noted EKG on 12/29/22 showed SR with PVCs.   CV: CT Coronary calcium Score 06/30/19: IMPRESSION: Coronary calcium score of 43. This was 31 percentile for age and sex matched control.  Past Medical History:  Diagnosis Date   Claudication [facility])    Elevated PSA    Erectile dysfunction    Hyperlipidemia    PAD (peripheral artery disease) (HCC)     Past Surgical History:  Procedure Laterality Date   ABDOMINAL AORTOGRAM W/LOWER EXTREMITY [name] 09/11/2020   Procedure: ABDOMINAL AORTOGRAM W/LOWER EXTREMITY;  Surgeon: Avanell Leigh, MD;  [facility]: [facility] INVASIVE CV LAB;  Service: Cardiovascular;  Laterality: [name];   ABDOMINAL AORTOGRAM W/LOWER EXTREMITY [name] 11/01/2020   Procedure: ABDOMINAL AORTOGRAM W/LOWER EXTREMITY;  Surgeon: Carlene Che, MD;  Location: [facility] INVASIVE  CV LAB;  Service: Cardiovascular;  Laterality: [name];   ABDOMINAL AORTOGRAM W/LOWER EXTREMITY Right 12/28/2021   Procedure: ABDOMINAL AORTOGRAM W/LOWER EXTREMITY;  Surgeon: Carlene Che, MD;  [facility]: [facility] INVASIVE CV LAB;  Service: Cardiovascular;  Laterality: Right;   COLONOSCOPY     x2   FEMORAL-TIBIAL BYPASS GRAFT Right 10/13/2020   Procedure: RIGHT COMMON FEMORAL ARTERY TO PERONEAL ARTERY  BYPASS WITH INSITU GREATER SAPHENOUS VEIN;  Surgeon: Carlene Che, MD;  Location: [facility] OR;  Service: Vascular;  Laterality: Right;   PERIPHERAL VASCULAR INTERVENTION Right 11/01/2020   Procedure: PERIPHERAL VASCULAR INTERVENTION;  Surgeon: Carlene Che, MD;  [facility] INVAS[facility] CV LAB;  Service: Cardiovascular;  Laterality: Right;   PERIPHERAL VASCULAR INTERVENTION Right 12/28/2021   Procedure: PERIPHERAL VASCULAR INTERVENTION;  Surgeon: Carlene Che, MD;  [facility] INV[facility] CV LAB;  Service: Cardiovascular;  Laterality: Right;  FEM/TIB BYPASS GRAFT   WITH THOMBECTOMY   TONSILLECTOMY     TONSILLECTOMY AND ADENOIDECTOMY      MEDICATIONS:  REPATHA 140 MG/ML SOSY   aspirin EC 81 MG tablet   Calcium Carb-Cholecalciferol (CALCIUM+D3 PO)   ezetimibe (ZETIA) 10 MG tablet   ketoconazole (NIZORAL) 2 % cream   MAGNESIUM CITRATE PO   Psyllium Husk POWD   rosuvastatin (CRESTOR) 40 MG tablet   XARELTO 2.5 MG TABS tablet    sodium chloride flush (NS) 0.9 % injection 3 mL    Ella Gun, PA-C Surgical Short Stay/Anesthesiology [facility] Phone [phone] [facility] Phone [phone] 10/21/2023 5:25 PM

## 2023-10-21 NOTE — Anesthesia Preprocedure Evaluation (Addendum)
 Anesthesia Evaluation  Patient identified by MRN, date of birth, ID band Patient awake    Reviewed: Allergy & Precautions, NPO status , Patient's Chart, lab work & pertinent test results  Airway Mallampati: III  TM Distance: >3 FB Neck ROM: Full    Dental no notable dental hx.    Pulmonary neg pulmonary ROS, former smoker   Pulmonary exam normal breath sounds clear to auscultation       Cardiovascular + Peripheral Vascular Disease  Normal cardiovascular exam Rhythm:Regular Rate:Normal  ECG: NSR, rate 92   Neuro/Psych negative neurological ROS  negative psych ROS   GI/Hepatic negative GI ROS, Neg liver ROS,,,  Endo/Other  negative endocrine ROS    Renal/GU negative Renal ROS     Musculoskeletal negative musculoskeletal ROS (+)    Abdominal   Peds  Hematology HLD   Anesthesia Other Findings   Reproductive/Obstetrics                             Anesthesia Physical Anesthesia Plan  ASA: 3  Anesthesia Plan: General   Post-op Pain Management: Tylenol  PO (pre-op)* and Celebrex  PO (pre-op)*   Induction: Intravenous  PONV Risk Score and Plan: 2 and Ondansetron , Dexamethasone , Midazolam  and Treatment may vary due to age or medical condition  Airway Management Planned: Oral ETT and LMA  Additional Equipment: None  Intra-op Plan:   Post-operative Plan: Extubation in OR  Informed Consent: I have reviewed the patients History and Physical, chart, labs and discussed the procedure including the risks, benefits and alternatives for the proposed anesthesia with the patient or authorized representative who has indicated his/her understanding and acceptance.     Dental advisory given  Plan Discussed with: CRNA and Anesthesiologist  Anesthesia Plan Comments: (PAT note written by Ella Gun, PA-C.  DISCUSSION: Patient is a 72-year male scheduled for the above procedure.   History  includes former smoker (quit 1981), HLD, PAD (right CFA-peroneal bypass 4/822; coil embolization of right FTBG AVF x2 11/01/20; mechanical thrombectomy of bypass with angioplasty/stenting 12/28/21).   Last cardiology follow-up was on 04/11/2023 with Dr[facility]. He was doing well. On Repatha  for aggressive therapy given coronary calcifications and PAD. Also on Zetia  and Crestor .    He is Xarelto  per vascular surgeon given PAD. He has had multiple RLE interventions as outlined above. Of note he had hypercoagulable evaluation with hematologist Jacquelyn Matt, MD in May 2022. Per 11/27/20 visit, "11/09/2020; blood counts normal, chemistries normal, mild elevation of ALT, Protein C and Protein S activity/total normal, Lupus negative, beta 2 glycoproteins normal, factor 5 leiden negative, Prothrombin gene mutation negative, ANA negative. -Advised pt that his JAK2 studies came back normal with no mutation detected. -Advised pt that the lab work showed no obvious indication for a blood clotting disorder. - All genetically acquired antibodies and mutation studies came back normal and not detected. No concern for vasculitis.  -Advised pt the extensive workup showed nothing obvious for a clotting disorder or increased risk of clots." As needed hematology follow-up recommended.   Dr. Melton Squires is reaching out to vascular surgeon for permission for patient to hold Xarelto  for 2 days prior to surgery.  Currently, last dose planned for 10/26/23.   EKG: Appears EKG was not done at PAT visit. Given PAD history, will plan to get an updated EKG on arrival for surgery. Last noted EKG on 12/29/22 showed SR with PVCs.   CT Coronary calcium  Score  06/30/19: IMPRESSION: Coronary calcium  score of 43. This was 31 percentile for age and sex matched control.    )        Anesthesia Quick Evaluation

## 2023-10-28 NOTE — Progress Notes (Signed)
 VM left for pt and spouse with new arrival time 0600, ERAS 0500.

## 2023-10-29 ENCOUNTER — Ambulatory Visit (HOSPITAL_BASED_OUTPATIENT_CLINIC_OR_DEPARTMENT_OTHER): Admitting: Anesthesiology

## 2023-10-29 ENCOUNTER — Ambulatory Visit (HOSPITAL_COMMUNITY): Payer: Self-pay | Admitting: Vascular Surgery

## 2023-10-29 ENCOUNTER — Other Ambulatory Visit: Payer: Self-pay

## 2023-10-29 ENCOUNTER — Encounter (HOSPITAL_COMMUNITY)
Admission: RE | Disposition: A | Discharge status: Home / Self Care | Payer: Self-pay | Source: Home / Self Care | Attending: General Surgery

## 2023-10-29 ENCOUNTER — Encounter (HOSPITAL_COMMUNITY): Payer: Self-pay | Admitting: General Surgery

## 2023-10-29 ENCOUNTER — Ambulatory Visit (HOSPITAL_COMMUNITY)
Admission: RE | Admit: 2023-10-29 | Discharge: 2023-10-29 | Disposition: A | Discharge status: Home / Self Care | Payer: PPO | Attending: General Surgery | Admitting: General Surgery

## 2023-10-29 DIAGNOSIS — I739 Peripheral vascular disease, unspecified: Secondary | ICD-10-CM | POA: Diagnosis not present

## 2023-10-29 DIAGNOSIS — Z87891 Personal history of nicotine dependence: Secondary | ICD-10-CM | POA: Insufficient documentation

## 2023-10-29 DIAGNOSIS — K4090 Unilateral inguinal hernia, without obstruction or gangrene, not specified as recurrent: Secondary | ICD-10-CM | POA: Diagnosis not present

## 2023-10-29 DIAGNOSIS — K4091 Unilateral inguinal hernia, without obstruction or gangrene, recurrent: Secondary | ICD-10-CM | POA: Diagnosis not present

## 2023-10-29 DIAGNOSIS — Z7901 Long term (current) use of anticoagulants: Secondary | ICD-10-CM | POA: Diagnosis not present

## 2023-10-29 DIAGNOSIS — E785 Hyperlipidemia, unspecified: Secondary | ICD-10-CM

## 2023-10-29 DIAGNOSIS — R931 Abnormal findings on diagnostic imaging of heart and coronary circulation: Secondary | ICD-10-CM

## 2023-10-29 DIAGNOSIS — K4020 Bilateral inguinal hernia, without obstruction or gangrene, not specified as recurrent: Secondary | ICD-10-CM | POA: Diagnosis not present

## 2023-10-29 DIAGNOSIS — Z01818 Encounter for other preprocedural examination: Secondary | ICD-10-CM

## 2023-10-29 HISTORY — PX: INGUINAL HERNIA REPAIR: SHX194

## 2023-10-29 SURGERY — REPAIR, HERNIA, INGUINAL, ADULT
Anesthesia: General | Laterality: Bilateral

## 2023-10-29 MED ORDER — FENTANYL CITRATE (PF) 250 MCG/5ML IJ SOLN
INTRAMUSCULAR | Status: AC
  Filled 2023-10-29: qty 5

## 2023-10-29 MED ORDER — FENTANYL CITRATE (PF) 250 MCG/5ML IJ SOLN
INTRAMUSCULAR | Status: DC | PRN
  Administered 2023-10-29 (×2): 50 ug via INTRAVENOUS

## 2023-10-29 MED ORDER — ONDANSETRON HCL 4 MG/2ML IJ SOLN: 4.0000 mg | Freq: Once | INTRAMUSCULAR | Status: DC | PRN

## 2023-10-29 MED ORDER — CEFAZOLIN SODIUM-DEXTROSE 2-3 GM-%(50ML) IV SOLR
INTRAVENOUS | Status: DC | PRN
  Administered 2023-10-29: 2 g via INTRAVENOUS

## 2023-10-29 MED ORDER — PROPOFOL 10 MG/ML IV BOLUS
INTRAVENOUS | Status: DC | PRN
  Administered 2023-10-29: 30 mg via INTRAVENOUS
  Administered 2023-10-29: 150 mg via INTRAVENOUS

## 2023-10-29 MED ORDER — ONDANSETRON HCL 4 MG/2ML IJ SOLN
INTRAMUSCULAR | Status: DC | PRN
  Administered 2023-10-29: 4 mg via INTRAVENOUS

## 2023-10-29 MED ORDER — ONDANSETRON HCL 4 MG/2ML IJ SOLN
INTRAMUSCULAR | Status: AC
  Filled 2023-10-29: qty 2

## 2023-10-29 MED ORDER — 0.9 % SODIUM CHLORIDE (POUR BTL) OPTIME
TOPICAL | Status: DC | PRN
  Administered 2023-10-29: 1000 mL

## 2023-10-29 MED ORDER — DEXAMETHASONE SODIUM PHOSPHATE 10 MG/ML IJ SOLN
INTRAMUSCULAR | Status: DC | PRN
Start: 2023-10-29 — End: 2023-10-29
  Administered 2023-10-29: 10 mg via INTRAVENOUS

## 2023-10-29 MED ORDER — FENTANYL CITRATE (PF) 100 MCG/2ML IJ SOLN: 25.0000 ug | INTRAMUSCULAR | Status: DC | PRN

## 2023-10-29 MED ORDER — CEFAZOLIN SODIUM-DEXTROSE 2-4 GM/100ML-% IV SOLN
INTRAVENOUS | Status: AC
  Filled 2023-10-29: qty 100

## 2023-10-29 MED ORDER — DEXAMETHASONE SODIUM PHOSPHATE 10 MG/ML IJ SOLN
INTRAMUSCULAR | Status: AC
  Filled 2023-10-29: qty 1

## 2023-10-29 MED ORDER — MEPERIDINE HCL 25 MG/ML IJ SOLN: 6.2500 mg | INTRAMUSCULAR | Status: DC | PRN

## 2023-10-29 MED ORDER — SUGAMMADEX SODIUM 200 MG/2ML IV SOLN
INTRAVENOUS | Status: DC | PRN
  Administered 2023-10-29: 200 mg via INTRAVENOUS

## 2023-10-29 MED ORDER — ORAL CARE MOUTH RINSE: 15.0000 mL | Freq: Once | OROMUCOSAL | Status: AC

## 2023-10-29 MED ORDER — BUPIVACAINE-EPINEPHRINE (PF) 0.25% -1:200000 IJ SOLN
INTRAMUSCULAR | Status: AC
  Filled 2023-10-29: qty 30

## 2023-10-29 MED ORDER — BUPIVACAINE-EPINEPHRINE 0.25% -1:200000 IJ SOLN
INTRAMUSCULAR | Status: DC | PRN
Start: 2023-10-29 — End: 2023-10-29
  Administered 2023-10-29: 20 mL

## 2023-10-29 MED ORDER — VASOPRESSIN 20 UNIT/ML IV SOLN
INTRAVENOUS | Status: AC
  Filled 2023-10-29: qty 1

## 2023-10-29 MED ORDER — PHENYLEPHRINE HCL-NACL 20-0.9 MG/250ML-% IV SOLN
INTRAVENOUS | Status: AC
  Filled 2023-10-29: qty 250

## 2023-10-29 MED ORDER — PHENYLEPHRINE 80 MCG/ML (10ML) SYRINGE FOR IV PUSH (FOR BLOOD PRESSURE SUPPORT)
PREFILLED_SYRINGE | INTRAVENOUS | Status: DC | PRN
  Administered 2023-10-29: 80 ug via INTRAVENOUS

## 2023-10-29 MED ORDER — OXYCODONE HCL 5 MG/5ML PO SOLN: 5.0000 mg | Freq: Once | ORAL | Status: DC | PRN

## 2023-10-29 MED ORDER — OXYCODONE HCL 5 MG PO TABS: 5.0000 mg | ORAL_TABLET | Freq: Once | ORAL | Status: DC | PRN

## 2023-10-29 MED ORDER — PHENYLEPHRINE HCL-NACL 20-0.9 MG/250ML-% IV SOLN
INTRAVENOUS | Status: DC | PRN
  Administered 2023-10-29: 30 ug/min via INTRAVENOUS

## 2023-10-29 MED ORDER — LIDOCAINE 2% (20 MG/ML) 5 ML SYRINGE
INTRAMUSCULAR | Status: AC
  Filled 2023-10-29: qty 5

## 2023-10-29 MED ORDER — TRAMADOL HCL 50 MG PO TABS: 50.0000 mg | ORAL_TABLET | Freq: Four times a day (QID) | ORAL | 0 refills | Status: AC | PRN

## 2023-10-29 MED ORDER — ACETAMINOPHEN 500 MG PO TABS
1000.0000 mg | ORAL_TABLET | Freq: Once | ORAL | Status: AC
  Administered 2023-10-29: 1000 mg via ORAL
  Filled 2023-10-29: qty 2

## 2023-10-29 MED ORDER — CELECOXIB 200 MG PO CAPS
200.0000 mg | ORAL_CAPSULE | Freq: Once | ORAL | Status: AC
  Administered 2023-10-29: 200 mg via ORAL
  Filled 2023-10-29: qty 1

## 2023-10-29 MED ORDER — EPHEDRINE SULFATE-NACL 50-0.9 MG/10ML-% IV SOSY
PREFILLED_SYRINGE | INTRAVENOUS | Status: DC | PRN
  Administered 2023-10-29 (×2): 5 mg via INTRAVENOUS
  Administered 2023-10-29: 10 mg via INTRAVENOUS

## 2023-10-29 MED ORDER — ROCURONIUM BROMIDE 10 MG/ML (PF) SYRINGE
PREFILLED_SYRINGE | INTRAVENOUS | Status: DC | PRN
  Administered 2023-10-29: 40 mg via INTRAVENOUS

## 2023-10-29 MED ORDER — PROPOFOL 10 MG/ML IV BOLUS
INTRAVENOUS | Status: AC
  Filled 2023-10-29: qty 20

## 2023-10-29 MED ORDER — CHLORHEXIDINE GLUCONATE 0.12 % MT SOLN
15.0000 mL | Freq: Once | OROMUCOSAL | Status: AC
  Administered 2023-10-29: 15 mL via OROMUCOSAL
  Filled 2023-10-29: qty 15

## 2023-10-29 MED ORDER — LACTATED RINGERS IV SOLN: INTRAVENOUS | Status: DC

## 2023-10-29 MED ORDER — ROCURONIUM BROMIDE 10 MG/ML (PF) SYRINGE
PREFILLED_SYRINGE | INTRAVENOUS | Status: AC
  Filled 2023-10-29: qty 10

## 2023-10-29 SURGICAL SUPPLY — 40 items
BAG COUNTER SPONGE SURGICOUNT (BAG) ×1 IMPLANT
BLADE CLIPPER SURG (BLADE) IMPLANT
CANISTER SUCT 3000ML PPV (MISCELLANEOUS) IMPLANT
CHLORAPREP W/TINT 26 (MISCELLANEOUS) ×1 IMPLANT
COVER SURGICAL LIGHT HANDLE (MISCELLANEOUS) ×1 IMPLANT
DERMABOND ADVANCED .7 DNX12 (GAUZE/BANDAGES/DRESSINGS) ×1 IMPLANT
DRAIN PENROSE .5X12 LATEX STL (DRAIN) IMPLANT
DRAPE LAPAROSCOPIC ABDOMINAL (DRAPES) ×1 IMPLANT
ELECTRODE REM PT RTRN 9FT ADLT (ELECTROSURGICAL) ×1 IMPLANT
GAUZE 4X4 16PLY ~~LOC~~+RFID DBL (SPONGE) ×1 IMPLANT
GLOVE BIO SURGEON STRL SZ7.5 (GLOVE) ×2 IMPLANT
GLOVE BIOGEL PI IND STRL 8 (GLOVE) ×1 IMPLANT
GOWN STRL REUS W/ TWL LRG LVL3 (GOWN DISPOSABLE) ×1 IMPLANT
GOWN STRL REUS W/ TWL XL LVL3 (GOWN DISPOSABLE) ×1 IMPLANT
KIT BASIN OR (CUSTOM PROCEDURE TRAY) ×1 IMPLANT
KIT TURNOVER KIT B (KITS) ×1 IMPLANT
MESH PARIETEX PROGRIP LEFT (Mesh General) IMPLANT
MESH PARIETEX PROGRIP RIGHT (Mesh General) IMPLANT
NDL HYPO 25GX1X1/2 BEV (NEEDLE) ×1 IMPLANT
NEEDLE HYPO 25GX1X1/2 BEV (NEEDLE) ×1 IMPLANT
NS IRRIG 1000ML POUR BTL (IV SOLUTION) ×1 IMPLANT
PACK GENERAL/GYN (CUSTOM PROCEDURE TRAY) ×1 IMPLANT
PAD ARMBOARD POSITIONER FOAM (MISCELLANEOUS) ×2 IMPLANT
PENCIL SMOKE EVACUATOR (MISCELLANEOUS) ×1 IMPLANT
SPONGE INTESTINAL PEANUT (DISPOSABLE) IMPLANT
SUT MNCRL AB 3-0 PS2 27 (SUTURE) IMPLANT
SUT MNCRL AB 4-0 PS2 18 (SUTURE) ×1 IMPLANT
SUT PROLENE 2 0 SH DA (SUTURE) ×1 IMPLANT
SUT SILK 0 TIES 10X30 (SUTURE) ×1 IMPLANT
SUT VIC AB 2-0 SH 27X BRD (SUTURE) ×1 IMPLANT
SUT VIC AB 2-0 SH 27XBRD (SUTURE) IMPLANT
SUT VIC AB 3-0 SH 27XBRD (SUTURE) ×1 IMPLANT
SUT VICRYL AB 2 0 TIES (SUTURE) ×1 IMPLANT
SYR CONTROL 10ML LL (SYRINGE) ×1 IMPLANT
SYRINGE TOOMEY DISP (SYRINGE) ×1 IMPLANT
TOWEL GREEN STERILE (TOWEL DISPOSABLE) ×1 IMPLANT
TOWEL GREEN STERILE FF (TOWEL DISPOSABLE) ×1 IMPLANT
TRAY FOL W/BAG SLVR 16FR STRL (SET/KITS/TRAYS/PACK) IMPLANT
TUBE CONNECTING 12X1/4 (SUCTIONS) IMPLANT
YANKAUER SUCT BULB TIP NO VENT (SUCTIONS) IMPLANT

## 2023-10-29 NOTE — Anesthesia Postprocedure Evaluation (Signed)
 Anesthesia Post Note  Patient: Lauris Port  Procedure(s) Performed: OPEN BILATERAL INGUINAL HERNIA REPAIR WITH MESH (Bilateral)     Patient location during evaluation: PACU Anesthesia Type: General Level of consciousness: awake and alert Pain management: pain level controlled Vital Signs Assessment: post-procedure vital signs reviewed and stable Respiratory status: spontaneous breathing, nonlabored ventilation, respiratory function stable and patient connected to nasal cannula oxygen Cardiovascular status: blood pressure returned to baseline and stable Postop Assessment: no apparent nausea or vomiting Anesthetic complications: no   No notable events documented.  Last Vitals:  Vitals:   10/29/23 1030 10/29/23 1045  BP: 121/66 126/67  Pulse: 90 87  Resp: 13 13  Temp:  36.5 C  SpO2: 97% 93%    Last Pain:  Vitals:   10/29/23 1009  TempSrc:   PainSc: Asleep                 Dorlisa Savino

## 2023-10-29 NOTE — Anesthesia Procedure Notes (Signed)
 Procedure Name: Intubation Date/Time: 10/29/2023 8:19 AM  Performed by: Merna Aase, CRNAPre-anesthesia Checklist: Patient identified, Patient being monitored, Timeout performed, Emergency Drugs available and Suction available Patient Re-evaluated:Patient Re-evaluated prior to induction Oxygen Delivery Method: Circle system utilized Preoxygenation: Pre-oxygenation with 100% oxygen Induction Type: IV induction Ventilation: Mask ventilation without difficulty Laryngoscope Size: Mac, 3 and 4 Grade View: Grade I Tube type: Oral Tube size: 7.5 mm Number of attempts: 1 Airway Equipment and Method: Stylet Placement Confirmation: ETT inserted through vocal cords under direct vision, positive ETCO2 and breath sounds checked- equal and bilateral Secured at: 23 cm Tube secured with: Tape Dental Injury: Teeth and Oropharynx as per pre-operative assessment

## 2023-10-29 NOTE — Transfer of Care (Signed)
 Immediate Anesthesia Transfer of Care Note  Patient: Lauris Port  Procedure(s) Performed: OPEN BILATERAL INGUINAL HERNIA REPAIR WITH MESH (Bilateral)  Patient Location: PACU  Anesthesia Type:General  Level of Consciousness: awake  Airway & Oxygen Therapy: Patient Spontanous Breathing and Patient connected to nasal cannula oxygen  Post-op Assessment: Report given to RN and Post -op Vital signs reviewed and stable  Post vital signs: Reviewed and stable  Last Vitals:  Vitals Value Taken Time  BP 132/64 10/29/23 1009  Temp 36.4 C 10/29/23 1009  Pulse 94 10/29/23 1012  Resp 15 10/29/23 1012  SpO2 98 % 10/29/23 1012  Vitals shown include unfiled device data.  Last Pain:  Vitals:   10/29/23 0651  TempSrc:   PainSc: 0-No pain         Complications: No notable events documented.

## 2023-10-29 NOTE — Discharge Instructions (Signed)

## 2023-10-29 NOTE — Op Note (Signed)
 10/29/2023  9:52 AM  PATIENT:  [name]  73 y.o. male  PRE-OPERATIVE DIAGNOSIS:  BILATERAL INGUINAL HERNIA  POST-OPERATIVE DIAGNOSIS:  BILATERAL DIRECT INGUINAL HERNIA, recurrent right inguinal  PROCEDURE:  Procedure(s): OPEN BILATERAL INGUINAL HERNIA REPAIR WITH MESH (Bilateral)  SURGEON:  Surgeons and Role:    Shela Derby, MD - Primary  PHYSICIAN ASSISTANT:   ASSISTANTS: Woodroe Hazel, RNFA   ANESTHESIA:   local and general  EBL:  minimal   BLOOD ADMINISTERED:none  DRAINS: none   LOCAL MEDICATIONS USED:  BUPIVICAINE   SPECIMEN:  No Specimen  DISPOSITION OF SPECIMEN:  [name]  COUNTS:  YES  TOURNIQUET:  * No tourniquets in log *  DICTATION: .Dragon Dictation  Findings: bilateral direct hernias, recurrent right inguinal   Details of the procedure: The patient was taken back to the operating room. The patient was placed in supine position with bilateral SCDs in place. The patient was prepped and draped in the usual sterile fashion.  After appropriate anitbiotics were confirmed, a time-out was confirmed and all facts were verified.  Quarter percent Marcaine  was used to infiltrate the area of the incision and an ilioinguinal nerve block was also placed.   A 5 cm incision was made just 1 cm superior to the inguinal ligament. Bovie cautery was used to maintain hemostasis dissection is carried down to the external oblique.  A standard incision was made laterally, and the external oblique was bluntly dissected away from the surrounding tissue with Metzenbaum scissors. The external oblique was elevated in the spermatic cord was bluntly dissected away from the surrounding tissue.    The spermatic cord and the hernia were then bluntly dissected away from the pubic tubercle and a Penrose was placed around the hernia sac in the spermatic cord. The vas deferens was identified and protected at all portions of the case. Dissection of the cremasterics took place with Bovie  cautery.  The hernia floor was obliterated d/t the size of the hernia. The hernia sac was dissected back to the internal inguinal ring.  The hernia appeared to be in the direct space.  This was not open.  This was placed back into the abdominal cavity.  At this time a left-sided Progrip mesh was then anchored to the pubic tubercle with a 2-0 Prolene.  It was anchored to the shelving edge of the external oblique x 1 and the conjoint tendon cephalad x 1.  The wrap around of the mesh was sutured to the conjoint tendon as well.  The new internal ring did not strangulate the spermatic cord.   The tail was then tucked under the external oblique. At this time the area was irrigated out with sterile saline.    A  right sided 5 cm incision was made just 1 cm superior to the inguinal ligament. Bovie cautery was used to maintain hemostasis dissection is carried down to the external oblique.  A standard incision was made laterally, and the external oblique was bluntly dissected away from the surrounding tissue with Metzenbaum scissors. The external oblique was elevated in the spermatic cord was bluntly dissected away from the surrounding tissue.   The spermatic cord and the hernia were then bluntly dissected away from the pubic tubercle and a Penrose was placed around the hernia sac in the spermatic cord.  There was no mesh underneath the external oblique.  The vas deferens was identified and protected at all portions of the case. Dissection of the cremasterics took place with Bovie cautery.  The hernia sac was dissected back to the internal inguinal ring.  The hernia appeared to be in the direct space.  This was not open.  This was placed back into the abdominal cavity.  There was no indirect hernia.  At this time a right-sided Progrip mesh was then anchored to the pubic tubercle with a 2-0 Prolene.  It was anchored to the shelving edge of the external oblique x 1 and the conjoint tendon cephalad x 1.  The wrap around of  the mesh was sutured to the conjoint tendon as well.  The new internal ring did not strangulate the spermatic cord.   The tail was then tucked under the external oblique. At this time the area was irrigated out with sterile saline.     Bilaterally, the external oblique was reapproximated using a 2-0 Vicryl in a running fashion. Scarpa's fascia was then reapproximated using a 3-0 Vicryl running fashion. The skin was then reapproximated with 4 Monocryl in a subcuticular fashion. The skin was then dressed with Dermabond.  The patient was taken to the recovery room in stable condition.   PLAN OF CARE: Admit to inpatient   PATIENT DISPOSITION:  PACU - hemodynamically stable.   Delay start of Pharmacological VTE agent (>24hrs) due to surgical blood loss or risk of bleeding: not applicable

## 2023-10-29 NOTE — H&P (Signed)
 Chief Complaint: NEW PROBLEM   History of Present Illness: [name] is a 73 y.o. male who is seen today as an office consultation at the request of Dr. Nathalie Jackson for evaluation of NEW PROBLEM .  Patient follows back up today secondary to bilateral inguinal hernias. Patient continues with some pain to the left inguinal area. These have had several occurrences of incarceration. This is led to some nausea. He is able to reduce this with the pressure. He has had no significant signs of strangulation.  Patient's had a previous femoropopliteal bypass. Patient currently on Xarelto . Patient sees Dr. Delon Jackson vascular surgery.  Review of Systems: A complete review of systems was obtained from the patient. I have reviewed this information and discussed as appropriate with the patient. See HPI as well for other ROS.  Review of Systems  Constitutional: Negative for fever.  HENT: Negative for congestion.  Eyes: Negative for blurred vision.  Respiratory: Negative for cough, shortness of breath and wheezing.  Cardiovascular: Negative for chest pain and palpitations.  Gastrointestinal: Negative for heartburn.  Genitourinary: Negative for dysuria.  Musculoskeletal: Negative for myalgias.  Skin: Negative for rash.  Neurological: Negative for dizziness and headaches.  Psychiatric/Behavioral: Negative for depression and suicidal ideas.  All other systems reviewed and are negative.   Medical History: History reviewed. No pertinent past medical history.  There is no problem list on file for this patient.  Past Surgical History:  Procedure Laterality Date  vascular bypass Right    No Active Allergies  Current Outpatient Medications on File Prior to Visit  Medication Sig Dispense Refill  ezetimibe  (ZETIA ) 10 mg tablet Take 1 tablet by mouth once daily  REPATHA  SYRINGE 140 mg/mL Syrg Inject subcutaneously  rivaroxaban  (XARELTO ) 2.5 mg tablet Take 1 tablet by mouth 2 (two) times daily   rosuvastatin  (CRESTOR ) 40 MG tablet Take 1 tablet by mouth every evening  cilostazoL  (PLETAL ) 50 MG tablet Take 1 tablet by mouth 2 (two) times daily (Patient not taking: Reported on 05/12/2023)   No current facility-administered medications on file prior to visit.   History reviewed. No pertinent family history.   Social History   Tobacco Use  Smoking Status Former  Smokeless Tobacco Never    Social History   Socioeconomic History  Marital status: Married  Tobacco Use  Smoking status: Former  Smokeless tobacco: Never  Advertising account planner  Vaping status: Never Used  Substance and Sexual Activity  Alcohol  use: Yes  Drug use: Never   Social Drivers of Architectural technologist Insecurity: No Food Insecurity (12/10/2022)  Received from [facility]  Hunger Vital Sign  Worried About Running Out of Food in the Last Year: Never true  Ran Out of Food in the Last Year: Never true  Transportation Needs: No Transportation Needs (12/10/2022)  Received from [facility] - Transportation  Lack of Transportation (Medical): No  Lack of Transportation (Non-Medical): No  Physical Activity: Sufficiently Active (12/10/2022)  Received from [facility]  Exercise Vital Sign  Days of Exercise per Week: 4 days  Minutes of Exercise per Session: 60 min   Objective:   BP (!) 141/78   Pulse 71   Temp 97.8 F (36.6 C) (Oral)   Resp 20   Ht 6' (1.829 m)   Wt 77.1 kg   SpO2 98%   BMI 23.06 kg/m    Body mass index is 22.32 kg/m. Physical Exam Constitutional:  Appearance: Normal appearance.  HENT:  Head: Normocephalic and atraumatic.  Nose:  Nose normal. No congestion.  Mouth/Throat:  Mouth: Mucous membranes are moist.  Pharynx: Oropharynx is clear.  Eyes:  Pupils: Pupils are equal, round, and reactive to light.  Cardiovascular:  Rate and Rhythm: Normal rate and regular rhythm.  Pulses: Normal pulses.  Heart sounds: Normal heart sounds. No murmur heard. No friction rub. No gallop.  Pulmonary:   Effort: Pulmonary effort is normal. No respiratory distress.  Breath sounds: Normal breath sounds. No stridor. No wheezing, rhonchi or rales.  Abdominal:  General: Abdomen is flat.  Palpations: Mass: L>R.  Hernia: A hernia is present. Hernia is present in the left inguinal area and right inguinal area.  Musculoskeletal:  General: Normal range of motion.  Cervical back: Normal range of motion.  Skin: General: Skin is warm and dry.  Neurological:  General: No focal deficit present.  Mental Status: He is alert and oriented to person, place, and time.  Psychiatric:  Mood and Affect: Mood normal.  Thought Content: Thought content normal.     Assessment and Plan:  Diagnoses and all orders for this visit:  Bilateral inguinal hernia without obstruction or gangrene, recurrence not specified   [name] is a 73 y.o. male   We will obtain clearance from Dr. Delon Jackson to be off the Xarelto  prior to surgery.  We will proceed to the OR for a open bilateral inguinal hernia repair with mesh. All risks and benefits were discussed with the patient, to generally include infection, bleeding, damage to surrounding structures, acute and chronic nerve pain, and recurrence. Alternatives were offered and described. All questions were answered and the patient voiced understanding of the procedure and wishes to proceed at this point.  No follow-ups on file.  Shela Derby, MD, [facility] Surgery, [place] [facility]

## 2023-10-30 ENCOUNTER — Encounter (HOSPITAL_COMMUNITY): Payer: Self-pay | Admitting: General Surgery

## 2023-11-25 DIAGNOSIS — I7025 Atherosclerosis of native arteries of other extremities with ulceration: Secondary | ICD-10-CM | POA: Diagnosis not present

## 2023-11-25 DIAGNOSIS — R972 Elevated prostate specific antigen [PSA]: Secondary | ICD-10-CM | POA: Diagnosis not present

## 2023-11-25 DIAGNOSIS — E785 Hyperlipidemia, unspecified: Secondary | ICD-10-CM | POA: Diagnosis not present

## 2023-11-25 DIAGNOSIS — Z95828 Presence of other vascular implants and grafts: Secondary | ICD-10-CM | POA: Diagnosis not present

## 2023-11-25 DIAGNOSIS — C4490 Unspecified malignant neoplasm of skin, unspecified: Secondary | ICD-10-CM | POA: Diagnosis not present

## 2023-11-25 DIAGNOSIS — Z860100 Personal history of colon polyps, unspecified: Secondary | ICD-10-CM | POA: Diagnosis not present

## 2023-11-25 DIAGNOSIS — N529 Male erectile dysfunction, unspecified: Secondary | ICD-10-CM | POA: Diagnosis not present

## 2023-11-25 DIAGNOSIS — I739 Peripheral vascular disease, unspecified: Secondary | ICD-10-CM | POA: Diagnosis not present

## 2023-11-25 DIAGNOSIS — R82998 Other abnormal findings in urine: Secondary | ICD-10-CM | POA: Diagnosis not present

## 2023-11-25 DIAGNOSIS — I700 Atherosclerosis of aorta: Secondary | ICD-10-CM | POA: Diagnosis not present

## 2023-12-11 DIAGNOSIS — C44719 Basal cell carcinoma of skin of left lower limb, including hip: Secondary | ICD-10-CM | POA: Diagnosis not present

## 2023-12-11 DIAGNOSIS — C44712 Basal cell carcinoma of skin of right lower limb, including hip: Secondary | ICD-10-CM | POA: Diagnosis not present

## 2023-12-11 DIAGNOSIS — D485 Neoplasm of uncertain behavior of skin: Secondary | ICD-10-CM | POA: Diagnosis not present

## 2023-12-11 DIAGNOSIS — Z85828 Personal history of other malignant neoplasm of skin: Secondary | ICD-10-CM | POA: Diagnosis not present

## 2023-12-11 DIAGNOSIS — L570 Actinic keratosis: Secondary | ICD-10-CM | POA: Diagnosis not present

## 2023-12-11 DIAGNOSIS — Z85820 Personal history of malignant melanoma of skin: Secondary | ICD-10-CM | POA: Diagnosis not present

## 2023-12-11 DIAGNOSIS — L821 Other seborrheic keratosis: Secondary | ICD-10-CM | POA: Diagnosis not present

## 2023-12-11 DIAGNOSIS — L812 Freckles: Secondary | ICD-10-CM | POA: Diagnosis not present

## 2024-01-12 ENCOUNTER — Other Ambulatory Visit: Payer: Self-pay | Admitting: Cardiology

## 2024-01-19 ENCOUNTER — Other Ambulatory Visit: Payer: Self-pay

## 2024-01-19 DIAGNOSIS — I739 Peripheral vascular disease, unspecified: Secondary | ICD-10-CM

## 2024-01-27 ENCOUNTER — Ambulatory Visit (HOSPITAL_COMMUNITY)
Admission: RE | Admit: 2024-01-27 | Discharge: 2024-01-27 | Disposition: A | Discharge status: Home / Self Care | Source: Ambulatory Visit | Attending: Vascular Surgery | Admitting: Vascular Surgery

## 2024-01-27 ENCOUNTER — Other Ambulatory Visit: Payer: Self-pay | Admitting: Vascular Surgery

## 2024-01-27 DIAGNOSIS — I739 Peripheral vascular disease, unspecified: Secondary | ICD-10-CM | POA: Insufficient documentation

## 2024-01-27 LAB — VAS US ABI WITH/WO TBI
Left ABI: 1.38
Right ABI: NOT DETECTED

## 2024-02-02 NOTE — Progress Notes (Unsigned)
 [facility] [place]  ASSESSMENT / PLAN: [name] is a 73 y.o. male with history of right femoral-peroneal bypass 10/13/20 for ischemic ulceration of right foot. 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  This is unfortunately thrombosed.  He had developed a rich collateral network which is making his PAD symptoms minimal.  Recommend: Complete cessation from all tobacco products. Blood glucose control with goal A1c < 7%. Blood pressure control with goal blood pressure < 140/90 mmHg. Lipid reduction therapy with goal LDL-C <70 mg/dL  Aspirin  81mg  PO QD.  Atorvastatin  40-80mg  PO QD (or other high intensity statin therapy).  He has healed his ulcer. He should continue medical therapy as above. I will see him again in 6 months.   CHIEF COMPLAINT: follow up  HISTORY OF PRESENT ILLNESS: [name] is a 73 y.o. male very well known to me with complex peripheral vascular history. He initially presented to care in Spring 2022 with disabling claudication. A cardiologist had previously informed him nothing could be done for his leg. He sought an opinion from me. A trial of watchful waiting was performed, but he returned to care with ischemic ulceration of the great toe about a month later. He underwent right femoral - peroneal bypass which he recovered from well. He healed his foot rapidly. He returned to exercise quickly. He had significant swelling in his leg postoperatively. Angiography revealed fistulae which were treated with coil embolization. He then progressed a proximal stenotic lesion and had low velocities in the graft. This was treated with angioplasty and stenting with good result.  He presents today for surveillance.  He is doing very well.  He is exercising is much as he likes.  He has some very mild calf cramping when walking uphill for a very long distance but otherwise feels  great.  12/31/22: Clinic to report cramping discomfort in his right lower extremity has returned.  He has been able to continue exercising at the gym.  He has noticed cramping when walking uphill or for long distances.  He has noticed some discoloration in his right foot as well.  We reviewed his noninvasive testing and clinical exam results [place].  His wife is with him today.  04/08/23: Patient returns to clinic for surveillance.  He is doing fairly well.  He has long distance claudication.  He is tolerating exercise therapy fairly well.  He has no rest pain.  He has no ulceration about his feet.  06/18/23: Patient worked into the clinic for evaluation after toenail debridement by Dr. Kennieth.  The patient has been slowly healing his debridement over the past several weeks.  He has been treated with antibiotics.  Patient reports very long distance claudication symptoms (about quarter mile) and no rest pain.  08/05/23: Doing well. Right foot has healed. No new complaints. No claudication or rest pain. No new ulcers. He is back to exercising.  VASCULAR SURGICAL HISTORY:  10/13/20: right common femoral to peroneal artery bypass with in-situ greater saphenous vein  11/01/20: Coil embolization of arteriovenous fistula x 2 in R fem-pop bypass (6mm Nestor coils x 4 total, 2 in each fistula)  12/28/21: Right femoral-peroneal bypass angioplasty and stenting (7x70mm Viabahn)   VASCULAR RISK FACTORS: Negative history of stroke / transient ischemic attack. Negative history of coronary artery disease.  Negative history of diabetes mellitus.  Negative history of smoking.  Positive history of hypertension.  Negative history  of chronic kidney disease.   Negative history of chronic obstructive pulmonary disease.  FUNCTIONAL STATUS: ECOG performance status: (0) Fully active, able to carry on all predisease performance without restriction Ambulatory status: Ambulatory within the community without limits  Past  Medical History:  Diagnosis Date   Claudication [facility])    Elevated PSA    Erectile dysfunction    Hyperlipidemia    PAD (peripheral artery disease) (HCC)     Past Surgical History:  Procedure Laterality Date   ABDOMINAL AORTOGRAM W/LOWER EXTREMITY [name] 09/11/2020   Procedure: ABDOMINAL AORTOGRAM W/LOWER EXTREMITY;  Surgeon: Court Dorn PARAS, MD;  Location: [facility] INVASIVE CV LAB;  Service: Cardiovascular;  Laterality: [name];   ABDOMINAL AORTOGRAM W/LOWER EXTREMITY [name] 11/01/2020   Procedure: ABDOMINAL AORTOGRAM W/LOWER EXTREMITY;  Surgeon: Magda Debby SAILOR, MD;  Location: [facility] INVASIVE CV LAB;  Service: Cardiovascular;  Laterality: [name];   ABDOMINAL AORTOGRAM W/LOWER EXTREMITY Right 12/28/2021   Procedure: ABDOMINAL AORTOGRAM W/LOWER EXTREMITY;  Surgeon: Magda Debby SAILOR, MD;  Location: [facility] INVASIVE CV LAB;  Service: Cardiovascular;  Laterality: Right;   COLONOSCOPY     x2   FEMORAL-TIBIAL BYPASS GRAFT Right 10/13/2020   Procedure: RIGHT COMMON FEMORAL ARTERY TO PERONEAL ARTERY BYPASS WITH INSITU GREATER SAPHENOUS VEIN;  Surgeon: Magda Debby SAILOR, MD;  Location: [facility] OR;  Service: Vascular;  Laterality: Right;   INGUINAL HERNIA REPAIR Bilateral 10/29/2023   Procedure: OPEN BILATERAL INGUINAL HERNIA REPAIR WITH MESH;  Surgeon: Rubin Calamity, MD;  Location: [facility] OR;  Service: General;  Laterality: Bilateral;   PERIPHERAL VASCULAR INTERVENTION Right 11/01/2020   Procedure: PERIPHERAL VASCULAR INTERVENTION;  Surgeon: Magda Debby SAILOR, MD;  Location: [facility]VAS[facility];  Service: Cardiovascular;  Laterality: Right;   PERIPHERAL VASCULAR INTERVENTION Right 12/28/2021   Procedure: PERIPHERAL VASCULAR INTERVENTION;  Surgeon: Magda Debby SAILOR, MD;  Location: [facility]AS[facility];  Service: Cardiovascular;  Laterality: Right;  FEM/TIB BYPASS GRAFT   WITH THOMBECTOMY   TONSILLECTOMY     TONSILLECTOMY AND ADENOIDECTOMY      Family History  Adopted: Yes    Social History   Socioeconomic History   Marital status:  Married    Spouse name: [name]   Number of children: [name]   Years of education: [name]   Highest education level: [name]  Occupational History   [name]  Tobacco Use   Smoking status: Former    Current packs/day: 0.00    Types: Cigarettes    Quit date: 14    Years since quitting: 44.6    Passive exposure: Never   Smokeless tobacco: Never  Vaping Use   Vaping status: Never Used  Substance and Sexual Activity   Alcohol  use: Yes    Alcohol /week: 4.0 standard drinks of alcohol     Types: 2 Cans of beer, 2 Shots of liquor per week    Comment: socially   Drug use: Never   Sexual activity: Not Currently  Other Topics Concern   [name]  Social History Narrative   [name]   Social Drivers of Health   Financial Resource Strain: [name]  Food Insecurity: No Food Insecurity (12/10/2022)   Hunger Vital Sign    Worried About Running Out of Food in the Last Year: Never true    Ran Out of Food in the Last Year: Never true  Transportation Needs: No Transportation Needs (12/10/2022)   PRAPARE - Administrator, Civil Service (Medical): No  Lack of Transportation (Non-Medical): No  Physical Activity: Sufficiently Active (12/10/2022)   Exercise Vital Sign    Days of Exercise per Week: 4 days    Minutes of Exercise per Session: 60 min  Stress: [name]  Social Connections: [name]  Intimate Partner Violence: [name]    No Known Allergies  Current Outpatient Medications  Medication Sig Dispense Refill   aspirin  EC 81 MG tablet Take 81 mg by mouth daily. Swallow whole.     Calcium  Carb-Cholecalciferol (CALCIUM +D3 PO) Take 1 capsule by mouth daily.     ezetimibe  (ZETIA ) 10 MG tablet TAKE 1 TABLET (10 MG TOTAL) BY MOUTH DAILY. 90 tablet 0   ketoconazole (NIZORAL) 2 % cream Apply 1 Application topically 2 (two) times daily as needed for irritation.     MAGNESIUM  CITRATE PO Take 1 capsule by mouth daily.     Psyllium Husk POWD  Take 1 Scoop by mouth daily.     REPATHA  140 MG/ML SOSY Inject 140 mg into the skin every 14 (fourteen) days. 6 mL 3   rosuvastatin  (CRESTOR ) 40 MG tablet TAKE 1 TABLET BY MOUTH EVERY EVENING. 90 tablet 3   traMADol  (ULTRAM ) 50 MG tablet Take 1 tablet (50 mg total) by mouth every 6 (six) hours as needed. 20 tablet 0   XARELTO  2.5 MG TABS tablet TAKE 1 TABLET BY MOUTH 2 TIMES DAILY. 60 tablet 11   Current Facility-Administered Medications  Medication Dose Route Frequency Provider Last Rate Last Admin   sodium chloride  flush (NS) 0.9 % injection 3 mL  3 mL Intravenous Q12H Court Dorn PARAS, MD        PHYSICAL EXAM There were no vitals filed for this visit.   [name].  He appears younger than his stated age. Right leg well healed and warm.  No Doppler flow in the right foot. Capillary refill is less than 2 seconds in the toes Right great toe debridement appears to be healing. No signs of infection.  PERTINENT LABORATORY AND RADIOLOGIC DATA  Most recent CBC    Latest Ref Rng & Units 10/20/2023    9:00 AM 12/28/2021    5:53 AM 04/24/2021    8:55 AM  CBC  WBC 4.0 - 10.5 K/uL 9.0   7.1   Hemoglobin 13.0 - 17.0 g/dL 83.4  84.6  85.0   Hematocrit 39.0 - 52.0 % 49.4  45.0  46.4   Platelets 150 - 400 K/uL 232   223      Most recent CMP    Latest Ref Rng & Units 10/20/2023    9:00 AM 12/28/2021    5:53 AM 04/24/2021    8:55 AM  CMP  Glucose 70 - 99 mg/dL 896  893  85   BUN 8 - 23 mg/dL 17  23  21    Creatinine 0.61 - 1.24 mg/dL 8.95  8.99  9.12   Sodium 135 - 145 mmol/L 139  139  143   Potassium 3.5 - 5.1 mmol/L 4.8  3.9  5.1   Chloride 98 - 111 mmol/L 103  99  107   CO2 22 - 32 mmol/L 30   30   Calcium  8.9 - 10.3 mg/dL 9.1   9.1     Renal function CrCl cannot be calculated (Patient's most recent lab result is older than the maximum 21 days allowed.).  No results found for: HGBA1C  LDL Chol Calc (NIH)  Date Value Ref Range  Status   01/02/2021 70 0 - 99 mg/dL Final     +-------+-----------+-----------+------------+------------+  ABI/TBIToday's ABIToday's TBIPrevious ABIPrevious TBI  +-------+-----------+-----------+------------+------------+  Right absent     absent     absent      absent        +-------+-----------+-----------+------------+------------+  Left  1.20       0.77       1.16        not saved     +-------+-----------+-----------+------------+------------+      Debby SAILOR. Magda, MD FACS [facility] Phone Number: [phone] 02/02/2024 3:29 PM   Total time spent on preparing this encounter including chart review, data review, collecting history, examining the patient, coordinating care for this established patient, 30 minutes.  Portions of this report may have been transcribed using voice recognition software.  Every effort has been made to ensure accuracy; however, inadvertent computerized transcription errors may still be present.

## 2024-02-03 ENCOUNTER — Ambulatory Visit: Payer: PPO | Attending: Vascular Surgery | Admitting: Vascular Surgery

## 2024-02-03 ENCOUNTER — Encounter: Payer: Self-pay | Admitting: Vascular Surgery

## 2024-02-03 VITALS — BP 152/89 | HR 78 | Temp 98.0°F | Ht 72.0 in | Wt 165.0 lb

## 2024-02-03 DIAGNOSIS — I739 Peripheral vascular disease, unspecified: Secondary | ICD-10-CM | POA: Diagnosis not present

## 2024-02-04 ENCOUNTER — Encounter: Payer: Self-pay | Admitting: Vascular Surgery

## 2024-02-05 ENCOUNTER — Telehealth: Payer: Self-pay

## 2024-02-05 ENCOUNTER — Encounter: Payer: Self-pay | Admitting: Vascular Surgery

## 2024-02-05 NOTE — Telephone Encounter (Signed)
 Pt called c/o of swelling and pain behind his right knee.   Pt reported no color or temp change to his leg or foot.  Advised pt to f/u with PCP.  Pt knows to go to the ED if:  Swelling, pain, pressure, or tenderness in the leg. Leg becoming warm, red, or discolored. Shortness of breath. Chest pain. Fast or irregular heartbeats (palpitations). Light-headedness, dizziness, or fainting. Coughing up blood.

## 2024-02-27 DIAGNOSIS — R350 Frequency of micturition: Secondary | ICD-10-CM | POA: Diagnosis not present

## 2024-02-27 DIAGNOSIS — N401 Enlarged prostate with lower urinary tract symptoms: Secondary | ICD-10-CM | POA: Diagnosis not present

## 2024-02-27 DIAGNOSIS — R3915 Urgency of urination: Secondary | ICD-10-CM | POA: Diagnosis not present

## 2024-02-27 DIAGNOSIS — N5201 Erectile dysfunction due to arterial insufficiency: Secondary | ICD-10-CM | POA: Diagnosis not present

## 2024-03-09 DIAGNOSIS — E785 Hyperlipidemia, unspecified: Secondary | ICD-10-CM

## 2024-03-18 ENCOUNTER — Encounter: Payer: Self-pay | Admitting: Internal Medicine

## 2024-03-31 DIAGNOSIS — R051 Acute cough: Secondary | ICD-10-CM | POA: Diagnosis not present

## 2024-03-31 DIAGNOSIS — J069 Acute upper respiratory infection, unspecified: Secondary | ICD-10-CM | POA: Diagnosis not present

## 2024-04-07 NOTE — Progress Notes (Signed)
  Cardiology Office Note   Date:  04/12/2024  ID:  [name], [name] 1950/10/29, MRN [id number] PCP: Wyatt Skates, DO  [facility] Providers Cardiologist:  Wyatt LITTIE Nanas, MD { Click to update primary MD,subspecialty MD or APP then REFRESH:1}    PMH Dyslipidemia LDL goal < 55 Elevated LP(a) Coronary artery calcification PAD S/p right femoral-peroneal bypass 10/13/2020 for ischemic ulceration of right foot S/p endovascular revision 11/01/2020 for AV fistulae and 12/28/2021 for proximal stenosis Hypertension Former tobacco abuse Quit smoking in 1981  Referred to Advanced Lipid Disorders clinic and seen by Dr. Mona 12/10/22 for management of dyslipidemia. History of PAD closely followed by Dr. Magda with [facility] and previously by Dr. Court. History of dyslipidemia on combination therapy of rosuvastatin  and ezetimibe . Also noted to have mild coronary artery calcification. Had lower extremity bypass grafting and therefore would be considered high risk with LDL goal 55 or lower. Most recent lipid panel revealed total cholesterol 152, triglycerides 102, HDL 44, and LDL 88. He was advised to start Repatha  by PCP but had not started it. LP(a) elevated at 192.  At follow-up visit 04/11/23, he reported compliance with Repatha  and no concerning side effects. He was advised to continue Repatha , rosuvastatin  and ezetimibe . NMR revealed total cholesterol < 300, LDL-C 18, HDL-C 76, triglycerides 61, total cholesterol 108, and small LDL-P 93.   History of Present Illness [name] is a 73 y.o. male ***  ROS: ***  Studies Reviewed       Lipoprotein (a)  Date/Time Value Ref Range Status  12/10/2022 03:39 PM 192.0 (H) <75.0 nmol/L Final    Comment:    **Results verified by repeat testing** Note:  Values greater than or equal to 75.0 nmol/L may        indicate an independent risk factor for CHD,        but must be evaluated with caution when applied        to non-Caucasian  populations due to the        influence of genetic factors on Lp(a) across        ethnicities.     Risk Assessment/Calculations           Physical Exam VS:  BP 132/64   Pulse 67   Ht 6' (1.829 m)   Wt 163 lb 4.8 oz (74.1 kg)   SpO2 98%   BMI 22.15 kg/m    Wt Readings from Last 3 Encounters:  04/12/24 163 lb 4.8 oz (74.1 kg)  02/03/24 165 lb (74.8 kg)  10/29/23 170 lb (77.1 kg)    GEN: Well nourished, well developed in no acute distress NECK: No JVD; No carotid bruits CARDIAC: ***RRR, no murmurs, rubs, gallops RESPIRATORY:  Clear to auscultation without rales, wheezing or rhonchi  ABDOMEN: Soft, non-tender, non-distended EXTREMITIES:  No edema; No deformity   ASSESSMENT AND PLAN ***    {Are you ordering a CV Procedure (e.g. stress test, cath, DCCV, TEE, etc)?   Press F2        :[id number]}  Dispo: ***  Signed, Wyatt Bane, NP-C

## 2024-04-12 ENCOUNTER — Encounter (HOSPITAL_BASED_OUTPATIENT_CLINIC_OR_DEPARTMENT_OTHER): Payer: Self-pay | Admitting: Nurse Practitioner

## 2024-04-12 ENCOUNTER — Ambulatory Visit (HOSPITAL_BASED_OUTPATIENT_CLINIC_OR_DEPARTMENT_OTHER): Admitting: Nurse Practitioner

## 2024-04-12 VITALS — BP 132/64 | HR 67 | Ht 72.0 in | Wt 163.3 lb

## 2024-04-12 DIAGNOSIS — E7841 Elevated Lipoprotein(a): Secondary | ICD-10-CM

## 2024-04-12 DIAGNOSIS — E785 Hyperlipidemia, unspecified: Secondary | ICD-10-CM

## 2024-04-12 DIAGNOSIS — I2510 Atherosclerotic heart disease of native coronary artery without angina pectoris: Secondary | ICD-10-CM | POA: Diagnosis not present

## 2024-04-12 DIAGNOSIS — Z7189 Other specified counseling: Secondary | ICD-10-CM

## 2024-04-12 DIAGNOSIS — I779 Disorder of arteries and arterioles, unspecified: Secondary | ICD-10-CM

## 2024-04-12 DIAGNOSIS — I739 Peripheral vascular disease, unspecified: Secondary | ICD-10-CM

## 2024-04-12 MED ORDER — METOPROLOL TARTRATE 50 MG PO TABS: 50.0000 mg | ORAL_TABLET | Freq: Once | ORAL | 0 refills | Status: AC

## 2024-04-12 NOTE — Patient Instructions (Signed)
 Medication Instructions:   Your physician recommends that you continue on your current medications as directed. Please refer to the Current Medication list given to you today.   *If you need a refill on your cardiac medications before your next appointment, please call your pharmacy*  Lab Work:  Your physician recommends that you return for a FASTING NMR, fasting after midnight.    If you have labs (blood work) drawn today and your tests are completely normal, you will receive your results only by: MyChart Message (if you have MyChart) OR A paper copy in the mail If you have any lab test that is abnormal or we need to change your treatment, we will call you to review the results.  Testing/Procedures:    Your cardiac CT will be scheduled at one of the below locations:    [facility] [address]  [place], [place] [address] [phone]   If scheduled at the Heart and V[facility] at [facility] street, please enter the parking lot using the [address] entrance and use the FREE valet service at the patient drop-off area. Enter the building and check-in with registration on the main floor.  Please follow these instructions carefully (unless otherwise directed):  An IV will be required for this test and Nitroglycerin will be given.  Hold all erectile dysfunction medications at least 3 days (72 hrs) prior to test. (Ie viagra, cialis, sildenafil, tadalafil, etc)   On the Night Before the Test: Be sure to Drink plenty of water. Do not consume any caffeinated/decaffeinated beverages or chocolate 12 hours prior to your test. Do not take any antihistamines 12 hours prior to your test.   On the Day of the Test: Drink plenty of water until 1 hour prior to the test. Do not eat any food 1 hour prior to test. You may take your regular medications prior to the test.  Take metoprolol  (Lopressor )take one (1) tablet by mouth ( 50 mg)  two hours prior to  test.   After the Test: Drink plenty of water. After receiving IV contrast, you may experience a mild flushed feeling. This is normal. On occasion, you may experience a mild rash up to 24 hours after the test. This is not dangerous. If this occurs, you can take Benadryl 25 mg, Zyrtec, Claritin, or Allegra and increase your fluid intake. (Patients taking Tikosyn should avoid Benadryl, and may take Zyrtec, Claritin, or Allegra) If you experience trouble breathing, this can be serious. If it is severe call 911 IMMEDIATELY. If it is mild, please call our office.  We will call to schedule your test 2-4 weeks out understanding that some insurance companies will need an authorization prior to the service being performed.   For more information and frequently asked questions, please visit our website : [email or web address]  For non-scheduling related questions, please contact the cardiac imaging nurse navigator should you have any questions/concerns: Cardiac Imaging Nurse Navigators Direct Office Dial: [phone]   For scheduling needs, including cancellations and rescheduling, please call [place], [phone].   Follow-Up: At [facility], you and your health needs are our priority.  As part of our continuing mission to provide you with exceptional heart care, our providers are all part of one team.  This team includes your primary Cardiologist (physician) and Advanced Practice Providers or APPs (Physician Assistants and Nurse Practitioners) who all work together to provide you with the care you need, when you need it.  Your next appointment:  1 year(s)  Provider:   Rosaline Bane, NP    We recommend signing up for the patient portal called MyChart.  Sign up information is provided on this After Visit Summary.  MyChart is used to connect with patients for Virtual Visits (Telemedicine).  Patients are able to view lab/test results, encounter notes, upcoming  appointments, etc.  Non-urgent messages can be sent to your provider as well.   To learn more about what you can do with MyChart, go to [email or web address].   Other Instructions  Your physician wants you to follow-up in: 1 year.  You will receive a reminder letter in the mail two months in advance. If you don't receive a letter, please call our office to schedule the follow-up appointment.

## 2024-04-24 ENCOUNTER — Other Ambulatory Visit: Payer: Self-pay | Admitting: Cardiology

## 2024-04-30 DIAGNOSIS — N401 Enlarged prostate with lower urinary tract symptoms: Secondary | ICD-10-CM | POA: Diagnosis not present

## 2024-05-07 DIAGNOSIS — N401 Enlarged prostate with lower urinary tract symptoms: Secondary | ICD-10-CM | POA: Diagnosis not present

## 2024-05-07 DIAGNOSIS — R3915 Urgency of urination: Secondary | ICD-10-CM | POA: Diagnosis not present

## 2024-05-07 DIAGNOSIS — R972 Elevated prostate specific antigen [PSA]: Secondary | ICD-10-CM | POA: Diagnosis not present

## 2024-05-07 DIAGNOSIS — R399 Unspecified symptoms and signs involving the genitourinary system: Secondary | ICD-10-CM | POA: Diagnosis not present

## 2024-05-20 ENCOUNTER — Encounter (HOSPITAL_COMMUNITY): Payer: Self-pay

## 2024-05-24 DIAGNOSIS — E785 Hyperlipidemia, unspecified: Secondary | ICD-10-CM | POA: Diagnosis not present

## 2024-05-31 ENCOUNTER — Other Ambulatory Visit: Payer: Self-pay | Admitting: Internal Medicine

## 2024-06-01 ENCOUNTER — Encounter (HOSPITAL_COMMUNITY): Payer: Self-pay

## 2024-06-07 ENCOUNTER — Ambulatory Visit (HOSPITAL_COMMUNITY)
Admission: RE | Admit: 2024-06-07 | Discharge: 2024-06-07 | Disposition: A | Source: Ambulatory Visit | Attending: Nurse Practitioner | Admitting: Nurse Practitioner

## 2024-06-07 DIAGNOSIS — E7841 Elevated Lipoprotein(a): Secondary | ICD-10-CM | POA: Diagnosis not present

## 2024-06-07 DIAGNOSIS — I2510 Atherosclerotic heart disease of native coronary artery without angina pectoris: Secondary | ICD-10-CM | POA: Diagnosis not present

## 2024-06-07 DIAGNOSIS — I739 Peripheral vascular disease, unspecified: Secondary | ICD-10-CM | POA: Insufficient documentation

## 2024-06-07 DIAGNOSIS — E785 Hyperlipidemia, unspecified: Secondary | ICD-10-CM | POA: Insufficient documentation

## 2024-06-07 MED ORDER — IOHEXOL 350 MG/ML SOLN
100.0000 mL | Freq: Once | INTRAVENOUS | Status: AC | PRN
  Administered 2024-06-07: 100 mL via INTRAVENOUS

## 2024-06-07 MED ORDER — NITROGLYCERIN 0.4 MG SL SUBL
0.8000 mg | SUBLINGUAL_TABLET | Freq: Once | SUBLINGUAL | Status: AC
  Administered 2024-06-07: 0.8 mg via SUBLINGUAL

## 2024-06-08 ENCOUNTER — Ambulatory Visit (HOSPITAL_BASED_OUTPATIENT_CLINIC_OR_DEPARTMENT_OTHER): Payer: Self-pay | Admitting: Nurse Practitioner

## 2024-06-15 DIAGNOSIS — L821 Other seborrheic keratosis: Secondary | ICD-10-CM | POA: Diagnosis not present

## 2024-06-15 DIAGNOSIS — C44519 Basal cell carcinoma of skin of other part of trunk: Secondary | ICD-10-CM | POA: Diagnosis not present

## 2024-06-15 DIAGNOSIS — L570 Actinic keratosis: Secondary | ICD-10-CM | POA: Diagnosis not present

## 2024-06-15 DIAGNOSIS — L812 Freckles: Secondary | ICD-10-CM | POA: Diagnosis not present

## 2024-06-15 DIAGNOSIS — D485 Neoplasm of uncertain behavior of skin: Secondary | ICD-10-CM | POA: Diagnosis not present

## 2024-06-15 DIAGNOSIS — Z85828 Personal history of other malignant neoplasm of skin: Secondary | ICD-10-CM | POA: Diagnosis not present

## 2024-06-15 DIAGNOSIS — L723 Sebaceous cyst: Secondary | ICD-10-CM | POA: Diagnosis not present

## 2024-07-09 ENCOUNTER — Telehealth (HOSPITAL_BASED_OUTPATIENT_CLINIC_OR_DEPARTMENT_OTHER): Payer: Self-pay

## 2024-07-09 NOTE — Telephone Encounter (Signed)
"  ° °  Pre-operative Risk Assessment    Patient Name: [name]  DOB: May 13, 1951 MRN: [id number]   Date of last office visit: 04/12/2024 with Rosaline Bane, NP Date of next office visit: None  Request for Surgical Clearance    Procedure:  Hernia repair surgery  Date of Surgery:  Clearance TBD                                 Surgeon:  Lynda Leos, MD Surgeon's Group or Practice Name:  [facility]  Phone number:  [phone] Fax number:  [phone] or [phone] - Rosaline Sprang, CMA   Type of Clearance Requested:   - Medical  - Pharmacy:  Hold Aspirin  and Rivaroxaban  (Xarelto ) -needs instructions   Type of Anesthesia:  General    Additional requests/questions:  None  Signed, Patrcia Iverson CROME   07/09/2024, 1:49 PM   "

## 2024-07-09 NOTE — Telephone Encounter (Signed)
 I s/w the pt and he tells me that his procedure probably will not be done until at least March or maybe April 2026. In over conversation we agreed the pt will call back and schedule tele when he finds out a date for the procedure. Pt has been informed the clearance is only good for 2 months. I stated I did not want to schedule tele too soon and then have to repeat, pt agreed.   I will remove from the preop call back pool until pt calls back to schedule tele preop appt.

## 2024-07-09 NOTE — Telephone Encounter (Signed)
" ° °  Name: [name]  DOB: 09-Nov-1950  MRN: [id number]  Primary Cardiologist: Lonni LITTIE Nanas, MD  Preoperative team, please contact this patient and set up a phone call appointment for further preoperative risk assessment. Please obtain consent and complete medication review. Thank you for your help.  I confirm that guidance regarding antiplatelet and oral anticoagulation therapy has been completed and, if necessary, noted below.  Xarelto  has been prescribed for history of PAD, managed by Dr. Magda.  Recommendations regarding holding of Xarelto  will need to come from prescribing office. Regarding ASA therapy, we recommend continuation of ASA throughout the perioperative period.  However, if the surgeon feels that cessation of ASA is required in the perioperative period, it may be stopped 5-7 days prior to surgery with a plan to resume it as soon as felt to be feasible from a surgical standpoint in the post-operative period.   I also confirmed the patient resides in the state of [place] . As per [facility] Medical Board telemedicine laws, the patient must reside in the state in which the provider is licensed.  Hasnain Manheim D Shubham Thackston, NP 07/09/2024, 3:37 PM [facility]  "

## 2024-08-02 ENCOUNTER — Other Ambulatory Visit: Payer: Self-pay | Admitting: Internal Medicine

## 2024-08-11 ENCOUNTER — Encounter: Payer: Self-pay | Admitting: Vascular Surgery
